# Patient Record
Sex: Male | Born: 1955
Health system: Southern US, Community
[De-identification: ages and names within clinical notes are randomized; demographics above are authoritative.]

## PROBLEM LIST (undated history)

## (undated) DIAGNOSIS — M25569 Pain in unspecified knee: Secondary | ICD-10-CM

## (undated) DIAGNOSIS — K279 Peptic ulcer, site unspecified, unspecified as acute or chronic, without hemorrhage or perforation: Secondary | ICD-10-CM

## (undated) DIAGNOSIS — M1990 Unspecified osteoarthritis, unspecified site: Secondary | ICD-10-CM

## (undated) HISTORY — PX: MANDIBLE FRACTURE SURGERY: SHX706

---

## 2017-08-03 ENCOUNTER — Ambulatory Visit: Payer: Self-pay | Attending: Family Medicine | Admitting: Physician Assistant

## 2017-08-03 ENCOUNTER — Ambulatory Visit (HOSPITAL_COMMUNITY)
Admission: RE | Admit: 2017-08-03 | Discharge: 2017-08-03 | Disposition: A | Discharge status: Home / Self Care | Payer: Self-pay | Source: Ambulatory Visit | Attending: Physician Assistant | Admitting: Physician Assistant

## 2017-08-03 VITALS — BP 121/68 | HR 60 | Temp 97.6°F | Resp 18 | Ht 73.0 in | Wt 180.6 lb

## 2017-08-03 DIAGNOSIS — Z79899 Other long term (current) drug therapy: Secondary | ICD-10-CM | POA: Insufficient documentation

## 2017-08-03 DIAGNOSIS — M25561 Pain in right knee: Secondary | ICD-10-CM | POA: Insufficient documentation

## 2017-08-03 DIAGNOSIS — G8929 Other chronic pain: Secondary | ICD-10-CM | POA: Insufficient documentation

## 2017-08-03 DIAGNOSIS — I7090 Unspecified atherosclerosis: Secondary | ICD-10-CM | POA: Insufficient documentation

## 2017-08-03 DIAGNOSIS — Z7689 Persons encountering health services in other specified circumstances: Secondary | ICD-10-CM | POA: Insufficient documentation

## 2017-08-03 DIAGNOSIS — M1711 Unilateral primary osteoarthritis, right knee: Secondary | ICD-10-CM | POA: Insufficient documentation

## 2017-08-03 DIAGNOSIS — M7989 Other specified soft tissue disorders: Secondary | ICD-10-CM | POA: Insufficient documentation

## 2017-08-03 MED ORDER — NAPROXEN 500 MG PO TABS: 500.0000 mg | ORAL_TABLET | Freq: Two times a day (BID) | ORAL | 1 refills | Status: DC

## 2017-08-03 MED FILL — NAPROXEN 500 MG TABLET: 500 | 30 days supply | Qty: 60 | Fill #0

## 2017-08-03 NOTE — Progress Notes (Signed)
Patient ID: Willie Parker, male   DOB: 11/17/1955, 62 y.o.   MRN: [id number]      Willie Parker, is a 62 y.o. male  HBZ:[id number]  BOF:[id number]  DOB - 06/19/1955  Subjective:  Chief Complaint and HPI: Willie Parker is a 62 y.o. male here today to establish care and for R knee pain and problem for several years.  NKI. All siblings have had to have knee replacement in their 41s. He takes goodies powders or ibuprofen when it acts up.  It is starting to bother him as far as climbing stairs and weight bearing.  No f/c.    ROS:   Constitutional:  No f/c, No night sweats, No unexplained weight loss. EENT:  No vision changes, No blurry vision, No hearing changes. No mouth, throat, or ear problems.  Respiratory: No cough, No SOB Cardiac: No CP, no palpitations GI:  No abd pain, No N/V/D. GU: No Urinary s/sx Musculoskeletal: +R knee pain Neuro: No headache, no dizziness, no motor weakness.  Skin: No rash Endocrine:  No polydipsia. No polyuria.  Psych: Denies SI/HI  No problems updated.  ALLERGIES: No Known Allergies  PAST MEDICAL HISTORY: History reviewed. No pertinent past medical history.  MEDICATIONS AT HOME: Prior to Admission medications   Medication Sig Start Date End Date Taking? Authorizing Provider  ibuprofen (ADVIL,MOTRIN) 200 MG tablet Take 200 mg by mouth every 6 (six) hours as needed.   Yes [provider]  naproxen (NAPROSYN) 500 MG tablet Take 1 tablet (500 mg total) by mouth 2 (two) times daily with a meal. X 7 days then prn pain 08/03/17   Argentina Donovan, PA-C     Objective:  EXAM:   Vitals:   08/03/17 0923  BP: 121/68  Pulse: 60  Resp: 18  Temp: 97.6 F (36.4 C)  TempSrc: Oral  SpO2: 97%  Weight: 180 lb 9.6 oz (81.9 kg)  Height: 6\' 1"  (1.854 m)    General appearance : A&OX3. NAD. Non-toxic-appearing HEENT: Atraumatic and Normocephalic.  PERRLA. EOM intact.   Neck: supple, no JVD. No cervical lymphadenopathy. No  thyromegaly Chest/Lungs:  Breathing-non-labored, Good air entry bilaterally, breath sounds normal without rales, rhonchi, or wheezing  CVS: S1 S2 regular, no murmurs, gallops, rubs  Extremities: Bilateral Lower Ext shows no edema, both legs are warm to touch with = pulse throughout.  R knee examined and compared with L.  There is mild swelling in the suprapatellar area.  There is bony prominence medially.  No specific TTP.  Ligaments are stable.   Neurology:  CN II-XII grossly intact, Non focal.   Psych:  TP linear. J/I WNL. Normal speech. Appropriate eye contact and affect.  Skin:  No Rash  Data Review No results found for: HGBA1C   Assessment & Plan   1. Chronic pain of right knee - DG Knee Complete 4 Views Right; Future - naproxen (NAPROSYN) 500 MG tablet; Take 1 tablet (500 mg total) by mouth 2 (two) times daily with a meal. X 7 days then prn pain  Dispense: 60 tablet; Refill: 1 - Ambulatory referral to Orthopedic Surgery  Patient have been counseled extensively about nutrition and exercise  Return in about 2 months (around 10/03/2017) for assign PCP; f/up Knee pain/?baseline bloodwork.  The patient was given clear instructions to go to ER or return to medical center if symptoms don't improve, worsen or new problems develop. The patient verbalized understanding. The patient was told to call to get lab results if they haven't heard  anything in the next week.     Freeman Caldron, PA-C [facility] and [place], [place]   08/03/2017, 9:38 AM

## 2017-08-05 ENCOUNTER — Telehealth: Payer: Self-pay | Admitting: *Deleted

## 2017-08-05 NOTE — Telephone Encounter (Signed)
-----   Message from Argentina Donovan, [place] sent at 08/03/2017  4:37 PM EDT ----- Please call patient.  His xray shows moderate arthritis.  Seeing an orthopedist is likely his best option and the referral has been placed. Thanks, Freeman Caldron, PA-C

## 2017-08-05 NOTE — Telephone Encounter (Signed)
Patient verified DOB Patient is aware of moderate arthritis being noted and a referral to the orthopedic being placed. Patient will return Monday with financial packet. No further questions.

## 2017-08-08 ENCOUNTER — Ambulatory Visit: Payer: Self-pay

## 2017-08-17 ENCOUNTER — Encounter: Payer: Self-pay | Admitting: Nurse Practitioner

## 2017-08-17 ENCOUNTER — Ambulatory Visit: Payer: Self-pay | Attending: Nurse Practitioner | Admitting: Nurse Practitioner

## 2017-08-17 VITALS — BP 117/79 | HR 66 | Temp 97.9°F | Ht 72.0 in | Wt 182.6 lb

## 2017-08-17 DIAGNOSIS — Z79899 Other long term (current) drug therapy: Secondary | ICD-10-CM | POA: Insufficient documentation

## 2017-08-17 DIAGNOSIS — Z8249 Family history of ischemic heart disease and other diseases of the circulatory system: Secondary | ICD-10-CM | POA: Insufficient documentation

## 2017-08-17 DIAGNOSIS — M25561 Pain in right knee: Secondary | ICD-10-CM | POA: Insufficient documentation

## 2017-08-17 DIAGNOSIS — G8929 Other chronic pain: Secondary | ICD-10-CM | POA: Insufficient documentation

## 2017-08-17 DIAGNOSIS — M7989 Other specified soft tissue disorders: Secondary | ICD-10-CM | POA: Insufficient documentation

## 2017-08-17 MED ORDER — TRAMADOL HCL 50 MG PO TABS: 50.0000 mg | ORAL_TABLET | Freq: Three times a day (TID) | ORAL | 0 refills | Status: DC | PRN

## 2017-08-17 MED ORDER — DICLOFENAC SODIUM 1 % TD GEL: 4.0000 g | Freq: Four times a day (QID) | TRANSDERMAL | 1 refills | Status: DC

## 2017-08-17 MED FILL — DICLOFENAC SODIUM 1% GEL: 1 | 6 days supply | Qty: 100 | Fill #0

## 2017-08-17 MED FILL — traMADol HCL 50 MG TABS: 50 | 20 days supply | Qty: 60 | Fill #0

## 2017-08-17 NOTE — Progress Notes (Signed)
Assessment & Plan:  Willie Parker was seen today for new patient (initial visit).  Diagnoses and all orders for this visit:  Acute pain of right knee -     diclofenac sodium (VOLTAREN) 1 % GEL; Apply 4 g topically 4 (four) times daily. -     traMADol (ULTRAM) 50 MG tablet; Take 1 tablet (50 mg total) by mouth every 8 (eight) hours as needed.  May alternate with heat and ice application for pain relief. May also alternate with acetaminophen  as prescribed for pain. Other alternatives water aerobics.  You must stay active and avoid a sedentary lifestyle.  Patient has been counseled on age-appropriate routine health concerns for screening and prevention. These are reviewed and up-to-date. Referrals have been placed accordingly. Immunizations are up-to-date or declined.    Subjective:   Chief Complaint  Patient presents with  . New Patient (Initial Visit)    Pt. is here of right knee pain. Pt. stated the medication is not helping him.    HPI Willie Parker 62 y.o. male presents to office today to establish care. He has a history of chronic right knee pain.  Knee Pain Patient presents with knee pain and swelling involving the  right knee. Onset of the symptoms was a few years ago. Inciting event: none known. Current symptoms include aching, dull, stiffness and unable to bear weight on the right. Pain is aggravated by bearing weight and lying down.  Patient has had prior knee problems. Evaluation to date:plain films: 08-03-2017 abnormal degenerative changes involving the medial and patellofemoral compartments of the knee. Treatment to date: ice, OTC analgesics which are not very effective and prescription NSAIDS which are not very effective. Will need additional imaging. He has been instructed to follow up with the financial assistance counselor.   Review of Systems  Constitutional: Negative for fever, malaise/fatigue and weight loss.  HENT: Negative.  Negative for nosebleeds.   Eyes: Negative.   Negative for blurred vision, double vision and photophobia.  Respiratory: Negative.  Negative for cough and shortness of breath.   Cardiovascular: Negative.  Negative for chest pain, palpitations and leg swelling.  Gastrointestinal: Negative.  Negative for heartburn, nausea and vomiting.  Musculoskeletal: Positive for joint pain (right knee pain). Negative for myalgias.  Neurological: Negative.  Negative for dizziness, focal weakness, seizures and headaches.  Psychiatric/Behavioral: Negative.  Negative for suicidal ideas.    History reviewed. No pertinent past medical history.  History reviewed. No pertinent surgical history.  Family History  Problem Relation Age of Onset  . Diabetes Mother   . Hypertension Mother     Social History Reviewed with no changes to be made today.   Outpatient Medications Prior to Visit  Medication Sig Dispense Refill  . ibuprofen (ADVIL,MOTRIN) 200 MG tablet Take 200 mg by mouth every 6 (six) hours as needed.    . naproxen (NAPROSYN) 500 MG tablet Take 1 tablet (500 mg total) by mouth 2 (two) times daily with a meal. X 7 days then prn pain 60 tablet 1   No facility-administered medications prior to visit.     No Known Allergies     Objective:    BP 117/79 (BP Location: Right Arm, Patient Position: Sitting, Cuff Size: Normal)   Pulse 66   Temp 97.9 F (36.6 C) (Oral)   Ht 6' (1.829 m)   Wt 182 lb 9.6 oz (82.8 kg)   SpO2 98%   BMI 24.77 kg/m  Wt Readings from Last 3 Encounters:  08/17/17 182  lb 9.6 oz (82.8 kg)  08/03/17 180 lb 9.6 oz (81.9 kg)    Physical Exam  Constitutional: He is oriented to person, place, and time. He appears well-developed and well-nourished. He is cooperative.  HENT:  Head: Normocephalic and atraumatic.  Eyes: EOM are normal.  Neck: Normal range of motion.  Cardiovascular: Normal rate, regular rhythm, normal heart sounds and intact distal pulses. Exam reveals no gallop and no friction rub.  No murmur  heard. Pulmonary/Chest: Effort normal and breath sounds normal. No tachypnea. No respiratory distress. He has no decreased breath sounds. He has no wheezes. He has no rhonchi. He has no rales. He exhibits no tenderness.  Abdominal: Soft. Bowel sounds are normal.  Musculoskeletal: Normal range of motion. He exhibits edema and tenderness.       Right knee: Tenderness found. Medial joint line, lateral joint line and patellar tendon tenderness noted.  Neurological: He is alert and oriented to person, place, and time. Coordination normal.  Skin: Skin is warm and dry.  Psychiatric: He has a normal mood and affect. His behavior is normal. Judgment and thought content normal.  Nursing note and vitals reviewed.      Patient has been counseled extensively about nutrition and exercise as well as the importance of adherence with medications and regular follow-up. The patient was given clear instructions to go to ER or return to medical center if symptoms don't improve, worsen or new problems develop. The patient verbalized understanding.   Follow-up: Return in about 6 weeks (around 09/28/2017) for right knee pain.   Willie Pounds, FNP-BC [facility] and [facility] [place], [place]   08/20/2017, 11:11 PM

## 2017-08-17 NOTE — Patient Instructions (Signed)
Osteoarthritis Osteoarthritis is a type of arthritis that affects tissue that covers the ends of bones in joints (cartilage). Cartilage acts as a cushion between the bones and helps them move smoothly. Osteoarthritis results when cartilage in the joints gets worn down. Osteoarthritis is sometimes called "wear and tear" arthritis. Osteoarthritis is the most common form of arthritis. It often occurs in older people. It is a condition that gets worse over time (a progressive condition). Joints that are most often affected by this condition are in:  Fingers.  Toes.  Hips.  Knees.  Spine, including neck and lower back.  What are the causes? This condition is caused by age-related wearing down of cartilage that covers the ends of bones. What increases the risk? The following factors may make you more likely to develop this condition:  Older age.  Being overweight or obese.  Overuse of joints, such as in athletes.  Past injury of a joint.  Past surgery on a joint.  Family history of osteoarthritis.  What are the signs or symptoms? The main symptoms of this condition are pain, swelling, and stiffness in the joint. The joint may lose its shape over time. Small pieces of bone or cartilage may break off and float inside of the joint, which may cause more pain and damage to the joint. Small deposits of bone (osteophytes) may grow on the edges of the joint. Other symptoms may include:  A grating or scraping feeling inside the joint when you move it.  Popping or creaking sounds when you move.  Symptoms may affect one or more joints. Osteoarthritis in a major joint, such as your knee or hip, can make it painful to walk or exercise. If you have osteoarthritis in your hands, you might not be able to grip items, twist your hand, or control small movements of your hands and fingers (fine motor skills). How is this diagnosed? This condition may be diagnosed based on:  Your medical history.  A  physical exam.  Your symptoms.  X-rays of the affected joint(s).  Blood tests to rule out other types of arthritis.  How is this treated? There is no cure for this condition, but treatment can help to control pain and improve joint function. Treatment plans may include:  A prescribed exercise program that allows for rest and joint relief. You may work with a physical therapist.  A weight control plan.  Pain relief techniques, such as: ? Applying heat and cold to the joint. ? Electric pulses delivered to nerve endings under the skin (transcutaneous electrical nerve stimulation, or TENS). ? Massage. ? Certain nutritional supplements.  NSAIDs or prescription medicines to help relieve pain.  Medicine to help relieve pain and inflammation (corticosteroids). This can be given by mouth (orally) or as an injection.  Assistive devices, such as a brace, wrap, splint, specialized glove, or cane.  Surgery, such as: ? An osteotomy. This is done to reposition the bones and relieve pain or to remove loose pieces of bone and cartilage. ? Joint replacement surgery. You may need this surgery if you have very bad (advanced) osteoarthritis.  Follow these instructions at home: Activity  Rest your affected joints as directed by your health care provider.  Do not drive or use heavy machinery while taking prescription pain medicine.  Exercise as directed. Your health care provider or physical therapist may recommend specific types of exercise, such as: ? Strengthening exercises. These are done to strengthen the muscles that support joints that are affected by arthritis.   They can be performed with weights or with exercise bands to add resistance. ? Aerobic activities. These are exercises, such as brisk walking or water aerobics, that get your heart pumping. ? Range-of-motion activities. These keep your joints easy to move. ? Balance and agility exercises. Managing pain, stiffness, and  swelling  If directed, apply heat to the affected area as often as told by your health care provider. Use the heat source that your health care provider recommends, such as a moist heat pack or a heating pad. ? If you have a removable assistive device, remove it as told by your health care provider. ? Place a towel between your skin and the heat source. If your health care provider tells you to keep the assistive device on while you apply heat, place a towel between the assistive device and the heat source. ? Leave the heat on for 20-30 minutes. ? Remove the heat if your skin turns bright red. This is especially important if you are unable to feel pain, heat, or cold. You may have a greater risk of getting burned.  If directed, put ice on the affected joint: ? If you have a removable assistive device, remove it as told by your health care provider. ? Put ice in a plastic bag. ? Place a towel between your skin and the bag. If your health care provider tells you to keep the assistive device on during icing, place a towel between the assistive device and the bag. ? Leave the ice on for 20 minutes, 2-3 times a day. General instructions  Take over-the-counter and prescription medicines only as told by your health care provider.  Maintain a healthy weight. Follow instructions from your health care provider for weight control. These may include dietary restrictions.  Do not use any products that contain nicotine or tobacco, such as cigarettes and e-cigarettes. These can delay bone healing. If you need help quitting, ask your health care provider.  Use assistive devices as directed by your health care provider.  Keep all follow-up visits as told by your health care provider. This is important. Where to find more information:  [employer] of Arthritis and Musculoskeletal and Skin Diseases: www.niams.SouthExposed.es  [employer] on Aging: http://kim-miller.com/  American College of Rheumatology:  www.rheumatology.org Contact a health care provider if:  Your skin turns red.  You develop a rash.  You have pain that gets worse.  You have a fever along with joint or muscle aches. Get help right away if:  You lose a lot of weight.  You suddenly lose your appetite.  You have night sweats. Summary  Osteoarthritis is a type of arthritis that affects tissue covering the ends of bones in joints (cartilage).  This condition is caused by age-related wearing down of cartilage that covers the ends of bones.  The main symptom of this condition is pain, swelling, and stiffness in the joint.  There is no cure for this condition, but treatment can help to control pain and improve joint function. This information is not intended to replace advice given to you by your health care provider. Make sure you discuss any questions you have with your health care provider. Document Released: 02/15/2005 Document Revised: 10/20/2015 Document Reviewed: 10/20/2015 Elsevier Interactive Patient Education  2018 [place].  Knee Pain, Adult Many things can cause knee pain. The pain often goes away on its own with time and rest. If the pain does not go away, tests may be done to find out what is  causing the pain. Follow these instructions at home: Activity  Rest your knee.  Do not do things that cause pain.  Avoid activities where both feet leave the ground at the same time (high-impact activities). Examples are running, jumping rope, and doing jumping jacks. General instructions  Take medicines only as told by your doctor.  Raise (elevate) your knee when you are resting. Make sure your knee is higher than your heart.  Sleep with a pillow under your knee.  If told, put ice on the knee: ? Put ice in a plastic bag. ? Place a towel between your skin and the bag. ? Leave the ice on for 20 minutes, 2-3 times a day.  Ask your doctor if you should wear an elastic knee support.  Lose weight if  you are overweight. Being overweight can make your knee hurt more.  Do not use any tobacco products. These include cigarettes, chewing tobacco, or electronic cigarettes. If you need help quitting, ask your doctor. Smoking may slow down healing. Contact a doctor if:  The pain does not stop.  The pain changes or gets worse.  You have a fever along with knee pain.  Your knee gives out or locks up.  Your knee swells, and becomes worse. Get help right away if:  Your knee feels warm.  You cannot move your knee.  You have very bad knee pain.  You have chest pain.  You have trouble breathing. Summary  Many things can cause knee pain. The pain often goes away on its own with time and rest.  Avoid activities that put stress on your knee. These include running and jumping rope.  Get help right away if you cannot move your knee, or if your knee feels warm, or if you have trouble breathing. This information is not intended to replace advice given to you by your health care provider. Make sure you discuss any questions you have with your health care provider. Document Released: 05/14/2008 Document Revised: 02/10/2016 Document Reviewed: 02/10/2016 Elsevier Interactive Patient Education  2017 [employer].

## 2017-08-20 ENCOUNTER — Encounter: Payer: Self-pay | Admitting: Nurse Practitioner

## 2017-11-15 ENCOUNTER — Ambulatory Visit: Payer: Medicaid Other | Attending: Nurse Practitioner | Admitting: Nurse Practitioner

## 2017-11-15 ENCOUNTER — Encounter: Payer: Self-pay | Admitting: Nurse Practitioner

## 2017-11-15 VITALS — BP 137/82 | HR 57 | Temp 98.0°F | Ht 72.0 in | Wt 188.8 lb

## 2017-11-15 DIAGNOSIS — M25561 Pain in right knee: Secondary | ICD-10-CM

## 2017-11-15 DIAGNOSIS — Z8249 Family history of ischemic heart disease and other diseases of the circulatory system: Secondary | ICD-10-CM | POA: Insufficient documentation

## 2017-11-15 DIAGNOSIS — Z833 Family history of diabetes mellitus: Secondary | ICD-10-CM | POA: Diagnosis not present

## 2017-11-15 DIAGNOSIS — Z1211 Encounter for screening for malignant neoplasm of colon: Secondary | ICD-10-CM

## 2017-11-15 DIAGNOSIS — M7989 Other specified soft tissue disorders: Secondary | ICD-10-CM | POA: Insufficient documentation

## 2017-11-15 DIAGNOSIS — G8929 Other chronic pain: Secondary | ICD-10-CM

## 2017-11-15 DIAGNOSIS — Z0000 Encounter for general adult medical examination without abnormal findings: Secondary | ICD-10-CM

## 2017-11-15 MED ORDER — DICLOFENAC SODIUM 1 % TD GEL: 4.0000 g | Freq: Four times a day (QID) | TRANSDERMAL | 1 refills | Status: DC

## 2017-11-15 MED ORDER — PREDNISONE 20 MG PO TABS: 20.0000 mg | ORAL_TABLET | Freq: Every day | ORAL | 0 refills | Status: AC

## 2017-11-15 MED FILL — DICLOFENAC SODIUM 1% GEL: 1 | 6 days supply | Qty: 100 | Fill #0

## 2017-11-15 MED FILL — predniSONE 20 MG TABS: 20 | 7 days supply | Qty: 7 | Fill #0

## 2017-11-15 NOTE — Progress Notes (Signed)
Assessment & Plan:  Willie Parker was seen today for follow-up.  Diagnoses and all orders for this visit:  Chronic pain of right knee -     Ambulatory referral to Orthopedic Surgery -     Uric Acid -     diclofenac sodium (VOLTAREN) 1 % GEL; Apply 4 g topically 4 (four) times daily. -     predniSONE (DELTASONE) 20 MG tablet; Take 1 tablet (20 mg total) by mouth daily with breakfast for 7 days.  Routine adult health maintenance -     CMP14+EGFR -     CBC  Colon cancer screening -     Ambulatory referral to Gastroenterology    Patient has been counseled on age-appropriate routine health concerns for screening and prevention. These are reviewed and up-to-date. Referrals have been placed accordingly. Immunizations are up-to-date or declined.    Subjective:   Chief Complaint  Patient presents with  . Follow-up    Pt. is for right knee pain.    HPI Willie Parker 62 y.o. male presents to office today for follow up to chronic right knee pain.  Knee Pain Ongoing for several years. Inciting event: None. Pain is aggravated by weight bearing, bending, twisting and attempting to sleep at night. Treatment to date: Ice, prescription NSAIDs, OTC analgesics with little relief of pain. At his last office visit with me on 08-17-2017 he was prescribed voltaren gel and tramadol. He states the gel has been most effective for pain relief.  Today he endorses increased swelling and pain of the right knee as he has been out of the voltaren gel for over 2 weeks now.   Plain film xray 08-03-2017 Moderate degenerative changes involve the medial and patellofemoral compartments of the knee  Review of Systems  Constitutional: Negative for fever, malaise/fatigue and weight loss.  HENT: Negative.  Negative for nosebleeds.   Eyes: Negative.  Negative for blurred vision, double vision and photophobia.  Respiratory: Negative.  Negative for cough and shortness of breath.   Cardiovascular: Positive for leg swelling  (right knee). Negative for chest pain and palpitations.  Gastrointestinal: Negative.  Negative for heartburn, nausea and vomiting.  Musculoskeletal: Positive for joint pain. Negative for myalgias.  Neurological: Negative.  Negative for dizziness, focal weakness, seizures and headaches.  Psychiatric/Behavioral: Negative.  Negative for suicidal ideas.    History reviewed. No pertinent past medical history.  History reviewed. No pertinent surgical history.  Family History  Problem Relation Age of Onset  . Diabetes Mother   . Hypertension Mother     Social History Reviewed with no changes to be made today.   Outpatient Medications Prior to Visit  Medication Sig Dispense Refill  . diclofenac sodium (VOLTAREN) 1 % GEL Apply 4 g topically 4 (four) times daily. 100 g 1  . traMADol (ULTRAM) 50 MG tablet Take 1 tablet (50 mg total) by mouth every 8 (eight) hours as needed. (Patient not taking: Reported on 11/15/2017) 60 tablet 0   No facility-administered medications prior to visit.     No Known Allergies     Objective:    BP 137/82 (BP Location: Left Arm, Patient Position: Sitting, Cuff Size: Normal)   Pulse (!) 57   Temp 98 F (36.7 C) (Oral)   Ht 6' (1.829 m)   Wt 188 lb 12.8 oz (85.6 kg)   SpO2 99%   BMI 25.61 kg/m  Wt Readings from Last 3 Encounters:  11/15/17 188 lb 12.8 oz (85.6 kg)  08/17/17 182 lb 9.6  oz (82.8 kg)  08/03/17 180 lb 9.6 oz (81.9 kg)    Physical Exam  Constitutional: He is oriented to person, place, and time. He appears well-developed and well-nourished. He is cooperative.  HENT:  Head: Normocephalic and atraumatic.  Eyes: EOM are normal.  Neck: Normal range of motion.  Cardiovascular: Regular rhythm and normal heart sounds. Bradycardia present. Exam reveals no gallop and no friction rub.  No murmur heard. Pulmonary/Chest: Effort normal and breath sounds normal. No tachypnea. No respiratory distress. He has no decreased breath sounds. He has no  wheezes. He has no rhonchi. He has no rales. He exhibits no tenderness.  Abdominal: Soft. Bowel sounds are normal.  Musculoskeletal: He exhibits no edema.       Right knee: He exhibits decreased range of motion and swelling. Tenderness found. Medial joint line and lateral joint line tenderness noted.  Neurological: He is alert and oriented to person, place, and time. Coordination normal.  Skin: Skin is warm and dry.  Psychiatric: He has a normal mood and affect. His behavior is normal. Judgment and thought content normal.  Nursing note and vitals reviewed.        Patient has been counseled extensively about nutrition and exercise as well as the importance of adherence with medications and regular follow-up. The patient was given clear instructions to go to ER or return to medical center if symptoms don't improve, worsen or new problems develop. The patient verbalized understanding.   Follow-up: Return in about 3 months (around 02/14/2018) for FASTING labs and Physical.   Gildardo Pounds, FNP-BC [facility] and [facility] [place], [place]   11/15/2017, 3:04 PM

## 2017-11-16 LAB — CMP14+EGFR
ALT: 19 IU/L (ref 0–44)
AST: 17 IU/L (ref 0–40)
Albumin/Globulin Ratio: 1.6 (ref 1.2–2.2)
Albumin: 4.7 g/dL (ref 3.6–4.8)
Alkaline Phosphatase: 90 IU/L (ref 39–117)
BUN/Creatinine Ratio: 18 (ref 10–24)
BUN: 14 mg/dL (ref 8–27)
Bilirubin Total: <0.2 mg/dL (ref 0.0–1.2)
CALCIUM: 10.1 mg/dL (ref 8.6–10.2)
CO2: 21 mmol/L (ref 20–29)
CREATININE: 0.78 mg/dL (ref 0.76–1.27)
Chloride: 103 mmol/L (ref 96–106)
GFR, EST AFRICAN AMERICAN: 113 mL/min/{1.73_m2} (ref >59)
GFR, EST NON AFRICAN AMERICAN: 98 mL/min/{1.73_m2} (ref >59)
GLOBULIN, TOTAL: 2.9 g/dL (ref 1.5–4.5)
Glucose: 80 mg/dL (ref 65–99)
Potassium: 4 mmol/L (ref 3.5–5.2)
Sodium: 142 mmol/L (ref 134–144)
Total Protein: 7.6 g/dL (ref 6.0–8.5)

## 2017-11-16 LAB — URIC ACID: URIC ACID: 6.1 mg/dL (ref 3.7–8.6)

## 2017-11-16 LAB — CBC
Hematocrit: 45 % (ref 37.5–51.0)
Hemoglobin: 15.6 g/dL (ref 13.0–17.7)
MCH: 29.4 pg (ref 26.6–33.0)
MCHC: 34.7 g/dL (ref 31.5–35.7)
MCV: 85 fL (ref 79–97)
PLATELETS: 229 10*3/uL (ref 150–450)
RBC: 5.3 x10E6/uL (ref 4.14–5.80)
RDW: 14.5 % (ref 12.3–15.4)
WBC: 10.2 10*3/uL (ref 3.4–10.8)

## 2017-11-18 ENCOUNTER — Telehealth: Payer: Self-pay

## 2017-11-18 NOTE — Telephone Encounter (Signed)
CMA attempt to reach patient to inform on results.  Patient's brother answer and was told to inform patient to give a call back for his lab results.

## 2017-11-18 NOTE — Telephone Encounter (Signed)
-----   Message from Gildardo Pounds, NP sent at 11/18/2017  8:52 AM EDT ----- Uric acid level is normal and does not indicate any gout.  All of your other labs including your electrolytes sodium potassium, kidney and liver function are normal.  Your CBC does not show any anemia.

## 2017-11-23 ENCOUNTER — Ambulatory Visit (INDEPENDENT_AMBULATORY_CARE_PROVIDER_SITE_OTHER): Payer: Self-pay | Admitting: Orthopaedic Surgery

## 2017-11-23 ENCOUNTER — Ambulatory Visit (INDEPENDENT_AMBULATORY_CARE_PROVIDER_SITE_OTHER): Payer: Self-pay

## 2017-11-23 ENCOUNTER — Encounter (INDEPENDENT_AMBULATORY_CARE_PROVIDER_SITE_OTHER): Payer: Self-pay | Admitting: Orthopaedic Surgery

## 2017-11-23 DIAGNOSIS — M1711 Unilateral primary osteoarthritis, right knee: Secondary | ICD-10-CM

## 2017-11-23 NOTE — Progress Notes (Signed)
Office Visit Note   Patient: Willie Parker           Date of Birth: [birth year]           MRN: [id number] Visit Date: 11/23/2017              Requested by: Willie Pounds, NP Willie Parker, Willie Parker Willie Parker PCP: Willie Pounds, NP   Assessment & Plan: Visit Diagnoses:  1. 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  Patient has been dealing with this for years now and he has had minimal relief from conservative treatment.  At this point after discussion of treatment options and their associated risks and benefits he elects to proceed with a right total knee replacement.  We discussed postoperative rehab and recovery.  He denies a history of DVT or nickel allergy.  He is okay taking aspirin.  Follow-up for 2-week postop visit.  We will schedule him in the near future.  Follow-Up Instructions: Return for 2 week postop visit.   Orders:  Orders Placed This Encounter  Procedures  . XR KNEE 3 VIEW RIGHT   No orders of the defined types were placed in this encounter.     Procedures: No procedures performed   Clinical Data: No additional findings.   Subjective: Chief Complaint  Patient presents with  . Right Knee - Pain    Willie Parker is a 62 year old gentleman who comes in with chronic right knee pain for many years.  He has severe limitations in ADLs he has difficulty sleeping at night.  He has trouble with stairs.  He states that he has stiffness of his knee he has difficulty bending.  He has tried oral and topical NSAIDs as well as steroid tapers.  He has had severe pain for years now.  He is very uncomfortable and has no quality of life.  Denies any mechanical symptoms.   Review of Systems  Constitutional: Negative.   All other systems reviewed and are negative.    Objective: Vital Signs: There were no vitals taken for this  visit.  Physical Exam  Constitutional: He is oriented to person, place, and time. He appears well-developed and well-nourished.  HENT:  Head: Normocephalic and atraumatic.  Eyes: Pupils are equal, round, and reactive to light.  Neck: Neck supple.  Pulmonary/Chest: Effort normal.  Abdominal: Soft.  Musculoskeletal: Normal range of motion.  Neurological: He is alert and oriented to person, place, and time.  Skin: Skin is warm.  Psychiatric: He has a normal mood and affect. His behavior is normal. Judgment and thought content normal.  Nursing note and vitals reviewed.   Ortho Exam Right knee exam shows no joint effusion.  Positive patellofemoral crepitus.  Collaterals and cruciates are stable.  Mild joint line tenderness. Specialty Comments:  No specialty comments available.  Imaging: Xr Knee 3 View Right  Result Date: 11/23/2017 Advanced tricompartmental degenerative joint disease    PMFS History: There are no active problems to display for this patient.  History reviewed. No pertinent past medical history.  Family History  Problem Relation Age of Onset  . Diabetes Mother   . Hypertension Mother     History reviewed. No pertinent surgical history. Social History   Occupational History  . Not on file  Tobacco Use  . Smoking status: Current Every Day Smoker    Packs/day: 0.25    Types: Cigarettes  .  Smokeless tobacco: Never Used  Substance and Sexual Activity  . Alcohol use: Yes    Comment: weekends  . Drug use: Never  . Sexual activity: Not Currently

## 2017-11-24 ENCOUNTER — Other Ambulatory Visit: Payer: Self-pay

## 2017-11-24 ENCOUNTER — Encounter (HOSPITAL_BASED_OUTPATIENT_CLINIC_OR_DEPARTMENT_OTHER): Payer: Self-pay | Admitting: Emergency Medicine

## 2017-11-24 ENCOUNTER — Emergency Department (HOSPITAL_BASED_OUTPATIENT_CLINIC_OR_DEPARTMENT_OTHER)
Admission: EM | Admit: 2017-11-24 | Discharge: 2017-11-24 | Disposition: A | Discharge status: Home / Self Care | Payer: Medicaid Other | Attending: Emergency Medicine | Admitting: Emergency Medicine

## 2017-11-24 DIAGNOSIS — F17210 Nicotine dependence, cigarettes, uncomplicated: Secondary | ICD-10-CM | POA: Diagnosis not present

## 2017-11-24 DIAGNOSIS — H5711 Ocular pain, right eye: Secondary | ICD-10-CM | POA: Diagnosis present

## 2017-11-24 DIAGNOSIS — H1089 Other conjunctivitis: Secondary | ICD-10-CM

## 2017-11-24 DIAGNOSIS — H10021 Other mucopurulent conjunctivitis, right eye: Secondary | ICD-10-CM | POA: Insufficient documentation

## 2017-11-24 DIAGNOSIS — H10022 Other mucopurulent conjunctivitis, left eye: Secondary | ICD-10-CM | POA: Insufficient documentation

## 2017-11-24 HISTORY — DX: Unspecified osteoarthritis, unspecified site: M19.90

## 2017-11-24 HISTORY — DX: Pain in unspecified knee: M25.569

## 2017-11-24 MED ORDER — POLYMYXIN B-TRIMETHOPRIM 10000-0.1 UNIT/ML-% OP SOLN: 1.0000 [drp] | OPHTHALMIC | 0 refills | Status: DC

## 2017-11-24 MED FILL — POLYMYXIN B/TMP EYE DROPS: 10000-0.1 | 7 days supply | Qty: 10 | Fill #0

## 2017-11-24 NOTE — ED Triage Notes (Signed)
Right eye irritation, itching and drainage for two days.

## 2017-12-05 NOTE — ED Provider Notes (Signed)
[facility]GENCY DEPARTMENT Provider Note   CSN: [id number] Arrival date & time: 11/24/17  1014     History   Chief Complaint Chief Complaint  Patient presents with  . Eye Problem    HPI Willie Parker is a 62 y.o. male.  HPI   62 year old male with right knee irritation.  Gradual onset about 2 days ago.  Describes itching and foreign body sensation "like Just to my."  Denies any trauma.  Some redness.  Some mild drainage and crusting in the morning.  No change in visual acuity.  He does not wear contacts.  Past Medical History:  Diagnosis Date  . Arthritis   . Knee pain     There are no active problems to display for this patient.   History reviewed. No pertinent surgical history.      Home Medications    Prior to Admission medications   Medication Sig Start Date End Date Taking? Authorizing Provider  diclofenac sodium (VOLTAREN) 1 % GEL Apply 4 g topically 4 (four) times daily. Patient not taking: Reported on 11/23/2017 11/15/17   Gildardo Pounds, NP  trimethoprim-polymyxin b (POLYTRIM) ophthalmic solution Place 1 drop into both eyes every 4 (four) hours. 11/24/17   Virgel Manifold, MD    Family History Family History  Problem Relation Age of Onset  . Diabetes Mother   . Hypertension Mother     Social History Social History   Tobacco Use  . Smoking status: Current Every Day Smoker    Packs/day: 0.25    Types: Cigarettes  . Smokeless tobacco: Never Used  Substance Use Topics  . Alcohol use: Yes    Comment: weekends  . Drug use: Never     Allergies   Patient has no known allergies.   Review of Systems Review of Systems   Physical Exam Updated Vital Signs BP 113/80   Pulse 65   Temp 98.6 F (37 C)   Resp 16   Ht 6' (1.829 m)   Wt 85.3 kg   SpO2 99%   BMI 25.50 kg/m   Physical Exam  Constitutional: He appears well-developed and well-nourished. No distress.  HENT:  Head: Normocephalic and atraumatic.  Eyes: Right  eye exhibits no discharge. Left eye exhibits no discharge.  Mild conjunctival injection of right eye.  Peri-ocular tissues are normal in appearance.  There is no proptosis.  Pupils are equally round reactive to light.  Anterior chamber clear.  Extraocular muscle function is intact.  Neck: Neck supple.  Cardiovascular: Normal rate, regular rhythm and normal heart sounds. Exam reveals no gallop and no friction rub.  No murmur heard. Pulmonary/Chest: Effort normal and breath sounds normal. No respiratory distress.  Abdominal: Soft. He exhibits no distension. There is no tenderness.  Musculoskeletal: He exhibits no edema or tenderness.  Neurological: He is alert.  Skin: Skin is warm and dry.  Psychiatric: He has a normal mood and affect. His behavior is normal. Thought content normal.  Nursing note and vitals reviewed.    ED Treatments / Results  Labs (all labs ordered are listed, but only abnormal results are displayed) Labs Reviewed - No data to display  EKG None  Radiology No results found.  Procedures Procedures (including critical care time)  Medications Ordered in ED Medications - No data to display   Initial Impression / Assessment and Plan / ED Course  I have reviewed the triage vital signs and the nursing notes.  Pertinent labs & imaging results that were  available during my care of the patient were reviewed by me and considered in my medical decision making (see chart for details).     62 year old male with likely allergic or viral conjunctivitis to the right knee.  He is concerned for possible infection.  Clinically not.  He was discharged with a prescription for Polytrim though.  Cool compresses.  He does not wear corrective lenses.  Return precautions were discussed.  Final Clinical Impressions(s) / ED Diagnoses   Final diagnoses:  Other conjunctivitis of both eyes    ED Discharge Orders         Ordered    trimethoprim-polymyxin b (POLYTRIM) ophthalmic  solution  Every 4 hours     11/24/17 1058           Virgel Manifold, MD 12/05/17 [phone]

## 2017-12-09 NOTE — Pre-Procedure Instructions (Addendum)
Sherman Donaldson  12/09/2017      [facility] [place] [place] [address] Phone: [phone] Fax: [phone]    Your procedure is scheduled on December 19, 2017.  Report to [facility] Admitting at 815 AM.  Call this number if you have problems the morning of surgery:  [phone]   Remember:  Do not eat or drink after midnight.    Take these medicines the morning of surgery with A SIP OF WATER  Eye ointment-if needed  7 days prior to surgery STOP taking any diclofenac (voltaren), Aspirin (unless otherwise instructed by your surgeon), Aleve, Naproxen, Ibuprofen, Motrin, Advil, Goody's, BC's, all herbal medications, fish oil, and all vitamins    Do not wear jewelry  Do not wear lotions, powders, or colognes, or deodorant.  Do  Men may shave face and neck.  Do not bring valuables to the hospital.  [facility] is not responsible for any belongings or valuables.  Contacts, dentures or bridgework may not be worn into surgery.  Leave your suitcase in the car.  After surgery it may be brought to your room.  For patients admitted to the hospital, discharge time will be determined by your treatment team.  Patients discharged the day of surgery will not be allowed to drive home.    - Preparing For Surgery  Before surgery, you can play an important role. Because skin is not sterile, your skin needs to be as free of germs as possible. You can reduce the number of germs on your skin by washing with CHG (chlorahexidine gluconate) Soap before surgery.  CHG is an antiseptic cleaner which kills germs and bonds with the skin to continue killing germs even after washing.    Oral Hygiene is also important to reduce your risk of infection.  Remember - BRUSH YOUR TEETH THE MORNING OF SURGERY WITH YOUR REGULAR TOOTHPASTE  Please do not use if you have an allergy to CHG or antibacterial soaps. If your  skin becomes reddened/irritated stop using the CHG.  Do not shave (including legs and underarms) for at least 48 hours prior to first CHG shower. It is OK to shave your face.  Please follow these instructions carefully.   1. Shower the NIGHT BEFORE SURGERY and the MORNING OF SURGERY with CHG.   2. If you chose to wash your hair, wash your hair first as usual with your normal shampoo.  3. After you shampoo, rinse your hair and body thoroughly to remove the shampoo.  4. Use CHG as you would any other liquid soap. You can apply CHG directly to the skin and wash gently with a scrungie or a clean washcloth.   5. Apply the CHG Soap to your body ONLY FROM THE NECK DOWN.  Do not use on open wounds or open sores. Avoid contact with your eyes, ears, mouth and genitals (private parts). Wash Face and genitals (private parts)  with your normal soap.  6. Wash thoroughly, paying special attention to the area where your surgery will be performed.  7. Thoroughly rinse your body with warm water from the neck down.  8. DO NOT shower/wash with your normal soap after using and rinsing off the CHG Soap.  9. Pat yourself dry with a CLEAN TOWEL.  10. Wear CLEAN PAJAMAS to bed the night before surgery, wear comfortable clothes the morning of surgery  11. Place CLEAN SHEETS on your bed  the night of your first shower and DO NOT SLEEP WITH PETS.  Day of Surgery:  Do not apply any deodorants/lotions.  Please wear clean clothes to the hospital/surgery center.   Remember to brush your teeth WITH YOUR REGULAR TOOTHPASTE.   Please read over the fact sheets that you were given.

## 2017-12-09 NOTE — Progress Notes (Addendum)
PCP: Geryl Rankins, MD  Cardiologist: pt denies  EKG: 12/12/17-obtain at PAT appt  Stress test: pt denies  ECHO: pt denies  Cardiac Cath: pt denies  Chest x-ray: 12/12/17-obtained at PAT appt

## 2017-12-12 ENCOUNTER — Other Ambulatory Visit: Payer: Self-pay

## 2017-12-12 ENCOUNTER — Encounter (HOSPITAL_COMMUNITY): Payer: Self-pay

## 2017-12-12 ENCOUNTER — Encounter (HOSPITAL_COMMUNITY)
Admission: RE | Admit: 2017-12-12 | Discharge: 2017-12-12 | Disposition: A | Discharge status: Home / Self Care | Payer: Medicaid Other | Source: Ambulatory Visit | Attending: Orthopaedic Surgery | Admitting: Orthopaedic Surgery

## 2017-12-12 ENCOUNTER — Encounter (HOSPITAL_COMMUNITY)
Admission: RE | Admit: 2017-12-12 | Discharge: 2017-12-12 | Disposition: A | Discharge status: Home / Self Care | Payer: Medicaid Other | Source: Ambulatory Visit | Attending: Physician Assistant | Admitting: Physician Assistant

## 2017-12-12 DIAGNOSIS — I498 Other specified cardiac arrhythmias: Secondary | ICD-10-CM | POA: Insufficient documentation

## 2017-12-12 DIAGNOSIS — R918 Other nonspecific abnormal finding of lung field: Secondary | ICD-10-CM | POA: Insufficient documentation

## 2017-12-12 DIAGNOSIS — M1711 Unilateral primary osteoarthritis, right knee: Secondary | ICD-10-CM | POA: Diagnosis present

## 2017-12-12 LAB — TYPE AND SCREEN
ABO/RH(D): O POS
ANTIBODY SCREEN: NEGATIVE

## 2017-12-12 LAB — CBC WITH DIFFERENTIAL/PLATELET
Abs Immature Granulocytes: 0.03 10*3/uL (ref 0.00–0.07)
BASOS ABS: 0 10*3/uL (ref 0.0–0.1)
Basophils Relative: 0 %
Eosinophils Absolute: 0.2 10*3/uL (ref 0.0–0.5)
Eosinophils Relative: 2 %
HEMATOCRIT: 44 % (ref 39.0–52.0)
HEMOGLOBIN: 14.6 g/dL (ref 13.0–17.0)
IMMATURE GRANULOCYTES: 0 %
LYMPHS PCT: 37 %
Lymphs Abs: 3.4 10*3/uL (ref 0.7–4.0)
MCH: 28.7 pg (ref 26.0–34.0)
MCHC: 33.2 g/dL (ref 30.0–36.0)
MCV: 86.6 fL (ref 80.0–100.0)
Monocytes Absolute: 0.6 10*3/uL (ref 0.1–1.0)
Monocytes Relative: 7 %
NEUTROS ABS: 4.9 10*3/uL (ref 1.7–7.7)
NEUTROS PCT: 54 %
Platelets: 205 10*3/uL (ref 150–400)
RBC: 5.08 MIL/uL (ref 4.22–5.81)
RDW: 13.4 % (ref 11.5–15.5)
WBC: 9.2 10*3/uL (ref 4.0–10.5)
nRBC: 0 % (ref 0.0–0.2)

## 2017-12-12 LAB — COMPREHENSIVE METABOLIC PANEL
ALBUMIN: 4.1 g/dL (ref 3.5–5.0)
ALK PHOS: 75 U/L (ref 38–126)
ALT: 24 U/L (ref 0–44)
AST: 21 U/L (ref 15–41)
Anion gap: 8 (ref 5–15)
BUN: 10 mg/dL (ref 6–20)
CO2: 22 mmol/L (ref 22–32)
Calcium: 9.2 mg/dL (ref 8.9–10.3)
Chloride: 108 mmol/L (ref 98–111)
Creatinine, Ser: 0.88 mg/dL (ref 0.61–1.24)
GFR calc Af Amer: >60 mL/min (ref >60)
GFR calc non Af Amer: >60 mL/min (ref >60)
GLUCOSE: 101 mg/dL — AB (ref 70–99)
POTASSIUM: 3.9 mmol/L (ref 3.5–5.1)
Sodium: 138 mmol/L (ref 135–145)
Total Bilirubin: 0.6 mg/dL (ref 0.3–1.2)
Total Protein: 7.4 g/dL (ref 6.5–8.1)

## 2017-12-12 LAB — ABO/RH: ABO/RH(D): O POS

## 2017-12-12 LAB — PROTIME-INR
INR: 0.94
Prothrombin Time: 12.5 seconds (ref 11.4–15.2)

## 2017-12-12 LAB — SURGICAL PCR SCREEN
MRSA, PCR: NEGATIVE
Staphylococcus aureus: NEGATIVE

## 2017-12-12 LAB — APTT: aPTT: 31 seconds (ref 24–36)

## 2017-12-13 ENCOUNTER — Ambulatory Visit: Payer: Self-pay

## 2017-12-14 ENCOUNTER — Ambulatory Visit: Payer: Self-pay

## 2017-12-14 ENCOUNTER — Ambulatory Visit (INDEPENDENT_AMBULATORY_CARE_PROVIDER_SITE_OTHER): Payer: Self-pay

## 2017-12-14 DIAGNOSIS — Z1211 Encounter for screening for malignant neoplasm of colon: Secondary | ICD-10-CM

## 2017-12-14 MED ORDER — NA SULFATE-K SULFATE-MG SULF 17.5-3.13-1.6 GM/177ML PO SOLN: 1.0000 | ORAL | 0 refills | Status: DC

## 2017-12-14 NOTE — Progress Notes (Signed)
Gastroenterology Pre-Procedure Review  Request Date:12/14/17 Requesting Physician: Dr.Fleming no previous tcs  PATIENT REVIEW QUESTIONS: The patient responded to the following health history questions as indicated:    1. Diabetes Melitis: no 2. Joint replacements in the past 12 months: yes, pt is having total knee replacement on 12/19/17- he has stopped all medications except eye drops and tylenol in preparation for surgery) 3. Major health problems in the past 3 months: no 4. Has an artificial valve or MVP: no 5. Has a defibrillator: no 6. Has been advised in past to take antibiotics in advance of a procedure like teeth cleaning: no 7. Family history of colon cancer: no  8. Alcohol Use: yes (occasionally ) 9. History of sleep apnea: no  10. History of coronary artery or other vascular stents placed within the last 12 months: no 11. History of any prior anesthesia complications: no    MEDICATIONS & ALLERGIES:    Patient reports the following regarding taking any blood thinners:   Plavix? no Aspirin? no Coumadin? no Brilinta? no Xarelto? no Eliquis? no Pradaxa? no Savaysa? no Effient? no  Patient confirms/reports the following medications:  Current Outpatient Medications  Medication Sig Dispense Refill  . acetaminophen (TYLENOL) 500 MG tablet Take 500 mg by mouth every 6 (six) hours as needed.    . trimethoprim-polymyxin b (POLYTRIM) ophthalmic solution Place 1 drop into both eyes every 4 (four) hours. (Patient taking differently: Place 1 drop into both eyes 4 (four) times daily. ) 10 mL 0  . diclofenac sodium (VOLTAREN) 1 % GEL Apply 4 g topically 4 (four) times daily. (Patient not taking: Reported on 12/14/2017) 100 g 1  . ibuprofen (ADVIL,MOTRIN) 200 MG tablet Take 400 mg by mouth every 6 (six) hours as needed.     No current facility-administered medications for this visit.     Patient confirms/reports the following allergies:  No Known Allergies  No orders of the  defined types were placed in this encounter.   AUTHORIZATION INFORMATION Pt has [facility] patient assistance  SCHEDULE INFORMATION: Procedure has been scheduled as follows:  Date: 02/08/18, Time: 12:00 Location: [facility] Dr.Rourk  This Gastroenterology Pre-Precedure Review Form is being routed to the following provider(s): Neil Crouch, PA

## 2017-12-14 NOTE — Patient Instructions (Signed)
Willie Parker  Jul 11, 1955 MRN: [id number]     Procedure Date: 02/08/18 Time to register: 11:00am Place to register: [facility] Procedure Time: 12:00pm Scheduled provider: R. Garfield Cornea, MD    PREPARATION FOR COLONOSCOPY WITH SUPREP BOWEL PREP KIT  Note: Suprep Bowel Prep Kit is a split-dose (2day) regimen. Consumption of BOTH 6-ounce bottles is required for a complete prep.  Please notify us immediately if you are diabetic, take iron supplements, or if you are on Coumadin or any other blood thinners.                                                                                                                                                  2 DAYS BEFORE PROCEDURE:  DATE: 02/06/18   DAY: Monday Begin clear liquid diet AFTER your lunch meal. NO SOLID FOODS after this point.  1 DAY BEFORE PROCEDURE:  DATE: 02/07/18  DAY: Tuesday Continue clear liquids the entire day - NO SOLID FOOD.   At 6:00pm: Complete steps 1 through 4 below, using ONE (1) 6-ounce bottle, before going to bed. Step 1:  Pour ONE (1) 6-ounce bottle of SUPREP liquid into the mixing container.  Step 2:  Add cool drinking water to the 16 ounce line on the container and mix.  Note: Dilute the solution concentrate as directed prior to use. Step 3:  DRINK ALL the liquid in the container. Step 4:  You MUST drink an additional two (2) or more 16 ounce containers of water over the next one (1) hour.   Continue clear liquids.  DAY OF PROCEDURE:   DATE: 02/08/18  DAY: Wednesday If you take medications for your heart, blood pressure, or breathing, you may take these medications.   5 hours before your procedure at :7:00am Step 1:  Pour ONE (1) 6-ounce bottle of SUPREP liquid into the mixing container.  Step 2:  Add cool drinking water to the 16 ounce line on the container and mix.  Note: Dilute the solution concentrate as directed prior to use. Step 3:  DRINK ALL the liquid in the container. Step 4:  You  MUST drink an additional two (2) or more 16 ounce containers of water over the next one (1) hour. You MUST complete the final glass of water at least 3 hours before your colonoscopy.   Nothing by mouth past:9:00am  You may take your morning medications with sip of water unless we have instructed otherwise.    Please see below for Dietary Information.  CLEAR LIQUIDS INCLUDE:  Water Jello (NOT red in color)   Ice Popsicles (NOT red in color)   Tea (sugar ok, no milk/cream) Powdered fruit flavored drinks  Coffee (sugar ok, no milk/cream) Gatorade/ Lemonade/ Kool-Aid  (NOT red in color)   Juice: apple, white grape, white cranberry Soft drinks  Clear bullion, consomme, broth (  fat free beef/chicken/vegetable)  Carbonated beverages (any kind)  Strained chicken noodle soup Hard Candy   Remember: Clear liquids are liquids that will allow you to see your fingers on the other side of a clear glass. Be sure liquids are NOT red in color, and not cloudy, but CLEAR.  DO NOT EAT OR DRINK ANY OF THE FOLLOWING:  Dairy products of any kind   Cranberry juice Tomato juice / V8 juice   Grapefruit juice Orange juice     Red grape juice  Do not eat any solid foods, including such foods as: cereal, oatmeal, yogurt, fruits, vegetables, creamed soups, eggs, bread, crackers, pureed foods in a blender, etc.   HELPFUL HINTS FOR DRINKING PREP SOLUTION:   Make sure prep is extremely cold. Mix and refrigerate the the morning of the prep. You may also put in the freezer.   You may try mixing some Crystal Light or Country Time Lemonade if you prefer. Mix in small amounts; add more if necessary.  Try drinking through a straw  Rinse mouth with water or a mouthwash between glasses, to remove after-taste.  Try sipping on a cold beverage /ice/ popsicles between glasses of prep.  Place a piece of sugar-free hard candy in mouth between glasses.  If you become nauseated, try consuming smaller amounts, or stretch out  the time between glasses. Stop for 30-60 minutes, then slowly start back drinking.     OTHER INSTRUCTIONS  You will need a responsible adult at least 61 years of age to accompany you and drive you home. This person must remain in the waiting room during your procedure. The hospital will cancel your procedure if you do not have a responsible adult with you.   1. Wear loose fitting clothing that is easily removed. 2. Leave jewelry and other valuables at home.  3. Remove all body piercing jewelry and leave at home. 4. Total time from sign-in until discharge is approximately 2-3 hours. 5. You should go home directly after your procedure and rest. You can resume normal activities the day after your procedure. 6. The day of your procedure you should not:  Drive  Make legal decisions  Operate machinery  Drink alcohol  Return to work   You may call the office (Dept: [phone]) before 5:00pm, or page the doctor on call ([phone]) after 5:00pm, for further instructions, if necessary.   Insurance Information YOU WILL NEED TO CHECK WITH YOUR INSURANCE COMPANY FOR THE BENEFITS OF COVERAGE YOU HAVE FOR THIS PROCEDURE.  UNFORTUNATELY, NOT ALL INSURANCE COMPANIES HAVE BENEFITS TO COVER ALL OR PART OF THESE TYPES OF PROCEDURES.  IT IS YOUR RESPONSIBILITY TO CHECK YOUR BENEFITS, HOWEVER, WE WILL BE GLAD TO ASSIST YOU WITH ANY CODES YOUR INSURANCE COMPANY MAY NEED.    PLEASE NOTE THAT MOST INSURANCE COMPANIES WILL NOT COVER A SCREENING COLONOSCOPY FOR PEOPLE UNDER THE AGE OF 50  IF YOU HAVE BCBS INSURANCE, YOU MAY HAVE BENEFITS FOR A SCREENING COLONOSCOPY BUT IF POLYPS ARE FOUND THE DIAGNOSIS WILL CHANGE AND THEN YOU MAY HAVE A DEDUCTIBLE THAT WILL NEED TO BE MET. SO PLEASE MAKE SURE YOU CHECK YOUR BENEFITS FOR A SCREENING COLONOSCOPY AS WELL AS A DIAGNOSTIC COLONOSCOPY.      

## 2017-12-15 DIAGNOSIS — M1711 Unilateral primary osteoarthritis, right knee: Secondary | ICD-10-CM

## 2017-12-15 MED FILL — SUPREP BOWEL PREP KIT: 17.5-3.13-1 | 1 days supply | Qty: 354 | Fill #0

## 2017-12-16 MED ORDER — TRANEXAMIC ACID-NACL 1000-0.7 MG/100ML-% IV SOLN
1000.0000 mg | INTRAVENOUS | Status: AC
  Administered 2017-12-19: 1000 mg via INTRAVENOUS
  Filled 2017-12-16: qty 100

## 2017-12-16 MED ORDER — TRANEXAMIC ACID 1000 MG/10ML IV SOLN
2000.0000 mg | INTRAVENOUS | Status: AC
  Administered 2017-12-19: 2000 mg via TOPICAL
  Filled 2017-12-16: qty 20

## 2017-12-19 ENCOUNTER — Inpatient Hospital Stay (HOSPITAL_COMMUNITY): Payer: Medicaid Other

## 2017-12-19 ENCOUNTER — Encounter (HOSPITAL_COMMUNITY): Payer: Self-pay | Admitting: Certified Registered Nurse Anesthetist

## 2017-12-19 ENCOUNTER — Encounter (HOSPITAL_COMMUNITY)
Admission: RE | Disposition: A | Discharge status: Home Health | Payer: Self-pay | Source: Home / Self Care | Attending: Orthopaedic Surgery

## 2017-12-19 ENCOUNTER — Other Ambulatory Visit: Payer: Self-pay

## 2017-12-19 ENCOUNTER — Inpatient Hospital Stay (HOSPITAL_COMMUNITY): Payer: Medicaid Other | Admitting: Certified Registered Nurse Anesthetist

## 2017-12-19 ENCOUNTER — Inpatient Hospital Stay (HOSPITAL_COMMUNITY): Payer: Medicaid Other | Admitting: Physician Assistant

## 2017-12-19 ENCOUNTER — Inpatient Hospital Stay (HOSPITAL_COMMUNITY)
Admission: RE | Admit: 2017-12-19 | Discharge: 2017-12-21 | DRG: 470 | Disposition: A | Discharge status: Home Health | Payer: Medicaid Other | Attending: Orthopaedic Surgery | Admitting: Orthopaedic Surgery

## 2017-12-19 DIAGNOSIS — Z833 Family history of diabetes mellitus: Secondary | ICD-10-CM

## 2017-12-19 DIAGNOSIS — F17210 Nicotine dependence, cigarettes, uncomplicated: Secondary | ICD-10-CM | POA: Diagnosis present

## 2017-12-19 DIAGNOSIS — Z96651 Presence of right artificial knee joint: Secondary | ICD-10-CM

## 2017-12-19 DIAGNOSIS — Z791 Long term (current) use of non-steroidal anti-inflammatories (NSAID): Secondary | ICD-10-CM

## 2017-12-19 DIAGNOSIS — Z8249 Family history of ischemic heart disease and other diseases of the circulatory system: Secondary | ICD-10-CM | POA: Diagnosis not present

## 2017-12-19 DIAGNOSIS — D62 Acute posthemorrhagic anemia: Secondary | ICD-10-CM | POA: Diagnosis not present

## 2017-12-19 DIAGNOSIS — Z79899 Other long term (current) drug therapy: Secondary | ICD-10-CM

## 2017-12-19 DIAGNOSIS — Z96659 Presence of unspecified artificial knee joint: Secondary | ICD-10-CM

## 2017-12-19 DIAGNOSIS — M1711 Unilateral primary osteoarthritis, right knee: Principal | ICD-10-CM

## 2017-12-19 DIAGNOSIS — Z23 Encounter for immunization: Secondary | ICD-10-CM | POA: Diagnosis not present

## 2017-12-19 DIAGNOSIS — E669 Obesity, unspecified: Secondary | ICD-10-CM | POA: Diagnosis present

## 2017-12-19 DIAGNOSIS — Z6825 Body mass index (BMI) 25.0-25.9, adult: Secondary | ICD-10-CM | POA: Diagnosis not present

## 2017-12-19 HISTORY — PX: TOTAL KNEE ARTHROPLASTY: SHX125

## 2017-12-19 SURGERY — ARTHROPLASTY, KNEE, TOTAL
Anesthesia: Spinal | Site: Knee | Laterality: Right

## 2017-12-19 MED ORDER — MIDAZOLAM HCL 2 MG/2ML IJ SOLN
INTRAMUSCULAR | Status: AC
  Filled 2017-12-19: qty 2

## 2017-12-19 MED ORDER — ACETAMINOPHEN 325 MG PO TABS
325.0000 mg | ORAL_TABLET | Freq: Four times a day (QID) | ORAL | Status: DC | PRN
  Administered 2017-12-21: 650 mg via ORAL
  Filled 2017-12-19: qty 2

## 2017-12-19 MED ORDER — PHENOL 1.4 % MT LIQD: 1.0000 | OROMUCOSAL | Status: DC | PRN

## 2017-12-19 MED ORDER — METHOCARBAMOL 500 MG PO TABS
500.0000 mg | ORAL_TABLET | Freq: Four times a day (QID) | ORAL | Status: DC | PRN
  Administered 2017-12-19 – 2017-12-21 (×7): 500 mg via ORAL
  Filled 2017-12-19 (×6): qty 1

## 2017-12-19 MED ORDER — METHOCARBAMOL 500 MG PO TABS
ORAL_TABLET | ORAL | Status: AC
  Administered 2017-12-19: 500 mg via ORAL
  Filled 2017-12-19: qty 1

## 2017-12-19 MED ORDER — MIDAZOLAM HCL 2 MG/2ML IJ SOLN
INTRAMUSCULAR | Status: AC
  Administered 2017-12-19: 2 mg
  Filled 2017-12-19: qty 2

## 2017-12-19 MED ORDER — OXYCODONE HCL 5 MG PO TABS
5.0000 mg | ORAL_TABLET | ORAL | Status: DC | PRN
  Administered 2017-12-19 – 2017-12-21 (×4): 10 mg via ORAL
  Filled 2017-12-19 (×4): qty 2

## 2017-12-19 MED ORDER — FENTANYL CITRATE (PF) 250 MCG/5ML IJ SOLN
INTRAMUSCULAR | Status: AC
  Filled 2017-12-19: qty 5

## 2017-12-19 MED ORDER — EPHEDRINE 5 MG/ML INJ
INTRAVENOUS | Status: AC
  Filled 2017-12-19: qty 10

## 2017-12-19 MED ORDER — SODIUM CHLORIDE 0.9% FLUSH
INTRAVENOUS | Status: DC | PRN
  Administered 2017-12-19: 10 mL

## 2017-12-19 MED ORDER — FENTANYL CITRATE (PF) 250 MCG/5ML IJ SOLN
INTRAMUSCULAR | Status: DC | PRN
  Administered 2017-12-19: 50 ug via INTRAVENOUS

## 2017-12-19 MED ORDER — PROPOFOL 10 MG/ML IV BOLUS
INTRAVENOUS | Status: AC
  Filled 2017-12-19: qty 20

## 2017-12-19 MED ORDER — ALUM & MAG HYDROXIDE-SIMETH 200-200-20 MG/5ML PO SUSP
30.0000 mL | ORAL | Status: DC | PRN
  Administered 2017-12-20 – 2017-12-21 (×2): 30 mL via ORAL
  Filled 2017-12-19 (×2): qty 30

## 2017-12-19 MED ORDER — LACTATED RINGERS IV SOLN
INTRAVENOUS | Status: DC
  Administered 2017-12-19: 08:00:00 via INTRAVENOUS

## 2017-12-19 MED ORDER — FENTANYL CITRATE (PF) 100 MCG/2ML IJ SOLN
INTRAMUSCULAR | Status: AC
  Administered 2017-12-19: 100 ug
  Filled 2017-12-19: qty 2

## 2017-12-19 MED ORDER — DOCUSATE SODIUM 100 MG PO CAPS
100.0000 mg | ORAL_CAPSULE | Freq: Two times a day (BID) | ORAL | Status: DC
  Administered 2017-12-19 – 2017-12-21 (×5): 100 mg via ORAL
  Filled 2017-12-19 (×5): qty 1

## 2017-12-19 MED ORDER — ONDANSETRON HCL 4 MG/2ML IJ SOLN
INTRAMUSCULAR | Status: DC | PRN
  Administered 2017-12-19: 4 mg via INTRAVENOUS

## 2017-12-19 MED ORDER — DEXAMETHASONE SODIUM PHOSPHATE 10 MG/ML IJ SOLN
10.0000 mg | Freq: Once | INTRAMUSCULAR | Status: AC
  Administered 2017-12-20: 10 mg via INTRAVENOUS
  Filled 2017-12-19: qty 1

## 2017-12-19 MED ORDER — METHOCARBAMOL 1000 MG/10ML IJ SOLN
500.0000 mg | Freq: Four times a day (QID) | INTRAVENOUS | Status: DC | PRN
  Filled 2017-12-19: qty 5

## 2017-12-19 MED ORDER — TRANEXAMIC ACID-NACL 1000-0.7 MG/100ML-% IV SOLN
1000.0000 mg | Freq: Once | INTRAVENOUS | Status: AC
  Administered 2017-12-19: 1000 mg via INTRAVENOUS
  Filled 2017-12-19: qty 100

## 2017-12-19 MED ORDER — ASPIRIN EC 81 MG PO TBEC: 81.0000 mg | DELAYED_RELEASE_TABLET | Freq: Two times a day (BID) | ORAL | 0 refills | Status: DC

## 2017-12-19 MED ORDER — SODIUM CHLORIDE 0.9 % IV SOLN
INTRAVENOUS | Status: DC
  Administered 2017-12-19 – 2017-12-20 (×2): via INTRAVENOUS

## 2017-12-19 MED ORDER — CEFAZOLIN SODIUM-DEXTROSE 2-4 GM/100ML-% IV SOLN
2.0000 g | Freq: Four times a day (QID) | INTRAVENOUS | Status: AC
  Administered 2017-12-19 – 2017-12-20 (×3): 2 g via INTRAVENOUS
  Filled 2017-12-19 (×3): qty 100

## 2017-12-19 MED ORDER — CHLORHEXIDINE GLUCONATE 4 % EX LIQD: 60.0000 mL | Freq: Once | CUTANEOUS | Status: DC

## 2017-12-19 MED ORDER — EPHEDRINE SULFATE-NACL 50-0.9 MG/10ML-% IV SOSY
PREFILLED_SYRINGE | INTRAVENOUS | Status: DC | PRN
  Administered 2017-12-19: 5 mg via INTRAVENOUS
  Administered 2017-12-19: 10 mg via INTRAVENOUS
  Administered 2017-12-19 (×2): 5 mg via INTRAVENOUS

## 2017-12-19 MED ORDER — CEFAZOLIN SODIUM-DEXTROSE 2-4 GM/100ML-% IV SOLN
INTRAVENOUS | Status: AC
  Filled 2017-12-19: qty 100

## 2017-12-19 MED ORDER — POLYETHYLENE GLYCOL 3350 17 G PO PACK: 17.0000 g | PACK | Freq: Every day | ORAL | Status: DC | PRN

## 2017-12-19 MED ORDER — SENNOSIDES-DOCUSATE SODIUM 8.6-50 MG PO TABS: 1.0000 | ORAL_TABLET | Freq: Every evening | ORAL | 1 refills | Status: DC | PRN

## 2017-12-19 MED ORDER — VANCOMYCIN HCL 1000 MG IV SOLR
INTRAVENOUS | Status: DC | PRN
  Administered 2017-12-19: 1000 mg via TOPICAL

## 2017-12-19 MED ORDER — METOCLOPRAMIDE HCL 5 MG/ML IJ SOLN: 5.0000 mg | Freq: Three times a day (TID) | INTRAMUSCULAR | Status: DC | PRN

## 2017-12-19 MED ORDER — PROPOFOL 500 MG/50ML IV EMUL
INTRAVENOUS | Status: DC | PRN
  Administered 2017-12-19: 60 ug/kg/min via INTRAVENOUS

## 2017-12-19 MED ORDER — HYDROMORPHONE HCL 1 MG/ML IJ SOLN
INTRAMUSCULAR | Status: AC
  Administered 2017-12-19: 0.5 mg via INTRAVENOUS
  Filled 2017-12-19: qty 1

## 2017-12-19 MED ORDER — FENTANYL CITRATE (PF) 100 MCG/2ML IJ SOLN: 100.0000 ug | Freq: Once | INTRAMUSCULAR | Status: DC

## 2017-12-19 MED ORDER — ASPIRIN 81 MG PO CHEW
81.0000 mg | CHEWABLE_TABLET | Freq: Two times a day (BID) | ORAL | Status: DC
  Administered 2017-12-19 – 2017-12-21 (×4): 81 mg via ORAL
  Filled 2017-12-19 (×4): qty 1

## 2017-12-19 MED ORDER — OXYCODONE HCL ER 10 MG PO T12A: 10.0000 mg | EXTENDED_RELEASE_TABLET | Freq: Two times a day (BID) | ORAL | 0 refills | Status: AC

## 2017-12-19 MED ORDER — SORBITOL 70 % SOLN: 30.0000 mL | Freq: Every day | Status: DC | PRN

## 2017-12-19 MED ORDER — GABAPENTIN 300 MG PO CAPS
300.0000 mg | ORAL_CAPSULE | Freq: Three times a day (TID) | ORAL | Status: DC
  Administered 2017-12-19 – 2017-12-21 (×6): 300 mg via ORAL
  Filled 2017-12-19 (×6): qty 1

## 2017-12-19 MED ORDER — LIDOCAINE 2% (20 MG/ML) 5 ML SYRINGE
INTRAMUSCULAR | Status: DC | PRN
  Administered 2017-12-19: 60 mg via INTRAVENOUS

## 2017-12-19 MED ORDER — VANCOMYCIN HCL 1000 MG IV SOLR
INTRAVENOUS | Status: AC
  Filled 2017-12-19: qty 1000

## 2017-12-19 MED ORDER — ONDANSETRON HCL 4 MG PO TABS: 4.0000 mg | ORAL_TABLET | Freq: Three times a day (TID) | ORAL | 0 refills | Status: DC | PRN

## 2017-12-19 MED ORDER — PROPOFOL 10 MG/ML IV BOLUS
INTRAVENOUS | Status: DC | PRN
  Administered 2017-12-19 (×2): 50 mg via INTRAVENOUS

## 2017-12-19 MED ORDER — ONDANSETRON HCL 4 MG PO TABS: 4.0000 mg | ORAL_TABLET | Freq: Four times a day (QID) | ORAL | Status: DC | PRN

## 2017-12-19 MED ORDER — KETOROLAC TROMETHAMINE 15 MG/ML IJ SOLN
30.0000 mg | Freq: Four times a day (QID) | INTRAMUSCULAR | Status: AC
  Administered 2017-12-19 – 2017-12-20 (×3): 30 mg via INTRAVENOUS
  Filled 2017-12-19 (×3): qty 2

## 2017-12-19 MED ORDER — INFLUENZA VAC SPLIT QUAD 0.5 ML IM SUSY
0.5000 mL | PREFILLED_SYRINGE | INTRAMUSCULAR | Status: AC
  Administered 2017-12-20: 0.5 mL via INTRAMUSCULAR
  Filled 2017-12-19: qty 0.5

## 2017-12-19 MED ORDER — CEFAZOLIN SODIUM-DEXTROSE 2-4 GM/100ML-% IV SOLN
2.0000 g | INTRAVENOUS | Status: AC
  Administered 2017-12-19: 2 g via INTRAVENOUS

## 2017-12-19 MED ORDER — HYDROMORPHONE HCL 1 MG/ML IJ SOLN
0.5000 mg | INTRAMUSCULAR | Status: DC | PRN
  Administered 2017-12-19: 1 mg via INTRAVENOUS
  Administered 2017-12-19: 0.5 mg via INTRAVENOUS
  Administered 2017-12-20: 1 mg via INTRAVENOUS
  Filled 2017-12-19 (×2): qty 1

## 2017-12-19 MED ORDER — LIDOCAINE 2% (20 MG/ML) 5 ML SYRINGE
INTRAMUSCULAR | Status: AC
  Filled 2017-12-19: qty 5

## 2017-12-19 MED ORDER — PROMETHAZINE HCL 25 MG PO TABS: 25.0000 mg | ORAL_TABLET | Freq: Four times a day (QID) | ORAL | 1 refills | Status: DC | PRN

## 2017-12-19 MED ORDER — METOCLOPRAMIDE HCL 5 MG PO TABS: 5.0000 mg | ORAL_TABLET | Freq: Three times a day (TID) | ORAL | Status: DC | PRN

## 2017-12-19 MED ORDER — OXYCODONE HCL 5 MG PO TABS: 5.0000 mg | ORAL_TABLET | ORAL | 0 refills | Status: DC | PRN

## 2017-12-19 MED ORDER — PNEUMOCOCCAL VAC POLYVALENT 25 MCG/0.5ML IJ INJ
0.5000 mL | INJECTION | INTRAMUSCULAR | Status: AC
  Administered 2017-12-20: 0.5 mL via INTRAMUSCULAR
  Filled 2017-12-19: qty 0.5

## 2017-12-19 MED ORDER — ROPIVACAINE HCL 5 MG/ML IJ SOLN
INTRAMUSCULAR | Status: DC | PRN
  Administered 2017-12-19: 30 mL via PERINEURAL

## 2017-12-19 MED ORDER — BUPIVACAINE IN DEXTROSE 0.75-8.25 % IT SOLN
INTRATHECAL | Status: DC | PRN
  Administered 2017-12-19: 1.6 mL via INTRATHECAL

## 2017-12-19 MED ORDER — BUPIVACAINE LIPOSOME 1.3 % IJ SUSP
20.0000 mL | INTRAMUSCULAR | Status: AC
  Administered 2017-12-19: 20 mL
  Filled 2017-12-19: qty 20

## 2017-12-19 MED ORDER — ONDANSETRON HCL 4 MG/2ML IJ SOLN
4.0000 mg | Freq: Four times a day (QID) | INTRAMUSCULAR | Status: DC | PRN
  Administered 2017-12-20: 4 mg via INTRAVENOUS
  Filled 2017-12-19: qty 2

## 2017-12-19 MED ORDER — ACETAMINOPHEN 500 MG PO TABS
1000.0000 mg | ORAL_TABLET | Freq: Four times a day (QID) | ORAL | Status: AC
  Administered 2017-12-19 – 2017-12-20 (×4): 1000 mg via ORAL
  Filled 2017-12-19 (×4): qty 2

## 2017-12-19 MED ORDER — OXYCODONE HCL ER 10 MG PO T12A
10.0000 mg | EXTENDED_RELEASE_TABLET | Freq: Two times a day (BID) | ORAL | Status: DC
  Administered 2017-12-19 – 2017-12-21 (×4): 10 mg via ORAL
  Filled 2017-12-19 (×4): qty 1

## 2017-12-19 MED ORDER — SODIUM CHLORIDE 0.9 % IR SOLN
Status: DC | PRN
  Administered 2017-12-19: 3000 mL

## 2017-12-19 MED ORDER — OXYCODONE HCL 5 MG PO TABS
ORAL_TABLET | ORAL | Status: AC
  Administered 2017-12-19: 10 mg via ORAL
  Filled 2017-12-19: qty 2

## 2017-12-19 MED ORDER — ONDANSETRON HCL 4 MG/2ML IJ SOLN
INTRAMUSCULAR | Status: AC
  Filled 2017-12-19: qty 2

## 2017-12-19 MED ORDER — OXYCODONE HCL 5 MG PO TABS
10.0000 mg | ORAL_TABLET | ORAL | Status: DC | PRN
  Administered 2017-12-19 – 2017-12-20 (×2): 10 mg via ORAL
  Administered 2017-12-21: 15 mg via ORAL
  Filled 2017-12-19: qty 3
  Filled 2017-12-19: qty 2

## 2017-12-19 MED ORDER — MIDAZOLAM HCL 2 MG/2ML IJ SOLN: 2.0000 mg | Freq: Once | INTRAMUSCULAR | Status: DC

## 2017-12-19 MED ORDER — DIPHENHYDRAMINE HCL 12.5 MG/5ML PO ELIX: 25.0000 mg | ORAL_SOLUTION | ORAL | Status: DC | PRN

## 2017-12-19 MED ORDER — MENTHOL 3 MG MT LOZG: 1.0000 | LOZENGE | OROMUCOSAL | Status: DC | PRN

## 2017-12-19 MED ORDER — METHOCARBAMOL 750 MG PO TABS: 750.0000 mg | ORAL_TABLET | Freq: Two times a day (BID) | ORAL | 0 refills | Status: DC | PRN

## 2017-12-19 MED ORDER — CELECOXIB 200 MG PO CAPS
200.0000 mg | ORAL_CAPSULE | Freq: Two times a day (BID) | ORAL | Status: DC
  Administered 2017-12-19 – 2017-12-21 (×5): 200 mg via ORAL
  Filled 2017-12-19 (×5): qty 1

## 2017-12-19 MED ORDER — 0.9 % SODIUM CHLORIDE (POUR BTL) OPTIME
TOPICAL | Status: DC | PRN
  Administered 2017-12-19: 1000 mL

## 2017-12-19 MED ORDER — MIDAZOLAM HCL 5 MG/5ML IJ SOLN
INTRAMUSCULAR | Status: DC | PRN
  Administered 2017-12-19: 2 mg via INTRAVENOUS

## 2017-12-19 MED ORDER — MAGNESIUM CITRATE PO SOLN: 1.0000 | Freq: Once | ORAL | Status: DC | PRN

## 2017-12-19 SURGICAL SUPPLY — 72 items
ALCOHOL ISOPROPYL (RUBBING) (MISCELLANEOUS) ×2 IMPLANT
BAG DECANTER FOR FLEXI CONT (MISCELLANEOUS) ×2 IMPLANT
BANDAGE ESMARK 6X9 LF (GAUZE/BANDAGES/DRESSINGS) ×1 IMPLANT
BEARIN INSERT TIBIAL SZ6 9 (Orthopedic Implant) ×2 IMPLANT
BEARING INSERT TIBIAL SZ6 9 (Orthopedic Implant) ×1 IMPLANT
BLADE SAW SGTL 13.0X1.19X90.0M (BLADE) ×2 IMPLANT
BNDG ELASTIC 6X10 VLCR STRL LF (GAUZE/BANDAGES/DRESSINGS) ×2 IMPLANT
BNDG ESMARK 6X9 LF (GAUZE/BANDAGES/DRESSINGS) ×2
BOWL SMART MIX CTS (DISPOSABLE) ×2 IMPLANT
CLSR STERI-STRIP ANTIMIC 1/2X4 (GAUZE/BANDAGES/DRESSINGS) ×4 IMPLANT
COVER SURGICAL LIGHT HANDLE (MISCELLANEOUS) ×2 IMPLANT
COVER WAND RF STERILE (DRAPES) ×2 IMPLANT
CUFF TOURNIQUET SINGLE 34IN LL (TOURNIQUET CUFF) ×2 IMPLANT
CUFF TOURNIQUET SINGLE 44IN (TOURNIQUET CUFF) IMPLANT
DRAPE EXTREMITY T 121X128X90 (DRAPE) ×2 IMPLANT
DRAPE HALF SHEET 40X57 (DRAPES) ×2 IMPLANT
DRAPE INCISE IOBAN 66X45 STRL (DRAPES) IMPLANT
DRAPE ORTHO SPLIT 77X108 STRL (DRAPES) ×2
DRAPE POUCH INSTRU U-SHP 10X18 (DRAPES) ×2 IMPLANT
DRAPE SURG ORHT 6 SPLT 77X108 (DRAPES) ×2 IMPLANT
DRAPE U-SHAPE 47X51 STRL (DRAPES) ×4 IMPLANT
DURAPREP 26ML APPLICATOR (WOUND CARE) ×4 IMPLANT
ELECT CAUTERY BLADE 6.4 (BLADE) IMPLANT
ELECT REM PT RETURN 9FT ADLT (ELECTROSURGICAL) ×2
ELECTRODE REM PT RTRN 9FT ADLT (ELECTROSURGICAL) ×1 IMPLANT
FEMORAL POSTERIOR SZ6 RT (Femur) ×1 IMPLANT
GAUZE SPONGE 4X4 12PLY STRL LF (GAUZE/BANDAGES/DRESSINGS) ×2 IMPLANT
GLOVE BIOGEL PI IND STRL 7.0 (GLOVE) ×1 IMPLANT
GLOVE BIOGEL PI INDICATOR 7.0 (GLOVE) ×1
GLOVE ECLIPSE 7.0 STRL STRAW (GLOVE) ×6 IMPLANT
GLOVE SKINSENSE NS SZ7.5 (GLOVE) ×1
GLOVE SKINSENSE STRL SZ7.5 (GLOVE) ×1 IMPLANT
GLOVE SURG SYN 7.5  E (GLOVE) ×4
GLOVE SURG SYN 7.5 E (GLOVE) ×4 IMPLANT
GOWN STRL REIN XL XLG (GOWN DISPOSABLE) ×2 IMPLANT
GOWN STRL REUS W/ TWL LRG LVL3 (GOWN DISPOSABLE) ×1 IMPLANT
GOWN STRL REUS W/TWL LRG LVL3 (GOWN DISPOSABLE) ×1
HANDPIECE INTERPULSE COAX TIP (DISPOSABLE) ×1
HOOD PEEL AWAY FLYTE STAYCOOL (MISCELLANEOUS) ×4 IMPLANT
KIT BASIN OR (CUSTOM PROCEDURE TRAY) ×2 IMPLANT
KIT TURNOVER KIT B (KITS) ×2 IMPLANT
KNEE PATELLA ASYMMETRIC 10X35 (Knees) ×2 IMPLANT
KNEE TIBIAL COMPONENT SZ6 (Knees) ×2 IMPLANT
MANIFOLD NEPTUNE II (INSTRUMENTS) ×2 IMPLANT
MARKER SKIN DUAL TIP RULER LAB (MISCELLANEOUS) ×2 IMPLANT
NEEDLE SPNL 18GX3.5 QUINCKE PK (NEEDLE) ×4 IMPLANT
NS IRRIG 1000ML POUR BTL (IV SOLUTION) ×2 IMPLANT
PACK TOTAL JOINT (CUSTOM PROCEDURE TRAY) ×2 IMPLANT
PAD ABD 8X10 STRL (GAUZE/BANDAGES/DRESSINGS) ×2 IMPLANT
PAD ARMBOARD 7.5X6 YLW CONV (MISCELLANEOUS) ×4 IMPLANT
PAD CAST 4YDX4 CTTN HI CHSV (CAST SUPPLIES) IMPLANT
PADDING CAST COTTON 4X4 STRL (CAST SUPPLIES)
PADDING CAST COTTON 6X4 STRL (CAST SUPPLIES) ×2 IMPLANT
POSTERIOR FEMORAL SZ6 RT (Femur) ×2 IMPLANT
SAW OSC TIP CART 19.5X105X1.3 (SAW) ×2 IMPLANT
SET HNDPC FAN SPRY TIP SCT (DISPOSABLE) ×1 IMPLANT
STAPLER VISISTAT 35W (STAPLE) IMPLANT
SUCTION FRAZIER HANDLE 10FR (MISCELLANEOUS) ×1
SUCTION TUBE FRAZIER 10FR DISP (MISCELLANEOUS) ×1 IMPLANT
SUT ETHILON 2 0 FS 18 (SUTURE) IMPLANT
SUT MNCRL AB 4-0 PS2 18 (SUTURE) IMPLANT
SUT VIC AB 0 CT1 27 (SUTURE) ×2
SUT VIC AB 0 CT1 27XBRD ANBCTR (SUTURE) ×2 IMPLANT
SUT VIC AB 1 CTX 27 (SUTURE) ×6 IMPLANT
SUT VIC AB 2-0 CT1 27 (SUTURE) ×3
SUT VIC AB 2-0 CT1 TAPERPNT 27 (SUTURE) ×3 IMPLANT
SYR 50ML LL SCALE MARK (SYRINGE) ×4 IMPLANT
TOWEL OR 17X24 6PK STRL BLUE (TOWEL DISPOSABLE) ×2 IMPLANT
TOWEL OR 17X26 10 PK STRL BLUE (TOWEL DISPOSABLE) ×2 IMPLANT
TRAY CATH 16FR W/PLASTIC CATH (SET/KITS/TRAYS/PACK) ×2 IMPLANT
UNDERPAD 30X30 (UNDERPADS AND DIAPERS) ×2 IMPLANT
WRAP KNEE MAXI GEL POST OP (GAUZE/BANDAGES/DRESSINGS) ×2 IMPLANT

## 2017-12-19 NOTE — Anesthesia Procedure Notes (Signed)
Procedure Name: MAC Date/Time: 12/19/2017 10:28 AM Performed by: Alain Marion, CRNA Pre-anesthesia Checklist: Patient identified, Emergency Drugs available, Suction available and Patient being monitored Patient Re-evaluated:Patient Re-evaluated prior to induction Oxygen Delivery Method: Simple face mask Placement Confirmation: positive ETCO2 Dental Injury: Teeth and Oropharynx as per pre-operative assessment

## 2017-12-19 NOTE — Op Note (Signed)
Total Knee Arthroplasty Procedure Note  Preoperative diagnosis: Right knee osteoarthritis  Postoperative diagnosis:same  Operative procedure: Right total knee arthroplasty. CPT [phone]  Surgeon: N. Eduard Roux, MD  Assist: Madalyn Rob, PA-C; necessary for the timely completion of procedure and due to complexity of procedure.  Anesthesia: Spinal, regional  Tourniquet time: less than 60 mins  Implants used: [employer] Triatholon uncemented Femur: PS 5 Tibia: 5 Patella: 35 mm Polyethylene: 9 mm  Indication: Avion Kutzer is a 62 y.o. year old male with a history of knee pain. Having failed conservative management, the patient elected to proceed with a total knee arthroplasty.  We have reviewed the risk and benefits of the surgery and they elected to proceed after voicing understanding.  Procedure:  After informed consent was obtained and understanding of the risk were voiced including but not limited to bleeding, infection, damage to surrounding structures including nerves and vessels, blood clots, leg length inequality and the failure to achieve desired results, the operative extremity was marked with verbal confirmation of the patient in the holding area.   The patient was then brought to the operating room and transported to the operating room table in the supine position.  A tourniquet was applied to the operative extremity around the upper thigh. The operative limb was then prepped and draped in the usual sterile fashion and preoperative antibiotics were administered.  A time out was performed prior to the start of surgery confirming the correct extremity, preoperative antibiotic administration, as well as team members, implants and instruments available for the case. Correct surgical site was also confirmed with preoperative radiographs. The limb was then elevated for exsanguination and the tourniquet was inflated. A midline incision was made and a standard medial  parapatellar approach was performed.  The patella was prepared and sized to a 35 mm.  A cover was placed on the patella for protection from retractors.  We then turned our attention to the femur. Posterior cruciate ligament was sacrificed. Start site was drilled in the femur and the intramedullary distal femoral cutting guide was placed, set at 5 degrees valgus, taking 9 mm of distal resection. The distal cut was made. Osteophytes were then removed. Next, the proximal tibial cutting guide was placed with appropriate slope, varus/valgus alignment and depth of resection. The proximal tibial cut was made. Gap blocks were then used to assess the extension gap and alignment, and appropriate soft tissue releases were performed. Attention was turned back to the femur, which was sized using the sizing guide to a size 5. Appropriate rotation of the femoral component was determined using epicondylar axis, Whiteside's line, and assessing the flexion gap under ligament tension. The appropriate size 4-in-1 cutting block was placed and cuts were made. Posterior femoral osteophytes and uncapped bone were then removed with the curved osteotome. The tibia was sized for a size 5 component. The femoral box-cutting guide was placed and prepared for a PS femoral component. Trial components were placed, and stability was checked in full extension, mid-flexion, and deep flexion. Proper tibial rotation was determined and marked.  The patella tracked well without a lateral release. Trial components were then removed and tibial preparation performed. A posterior capsular injection comprising of 20 cc of 1.3% exparel and 40 cc of normal saline was performed for postoperative pain control. The bony surfaces were irrigated with a pulse lavage and then dried. The final components sized above were malleted into place. The stability of the construct was re-evaluated throughout a range of motion  and found to be acceptable. The trial liner was  removed, the knee was copiously irrigated, and the knee was re-evaluated for any excess bone debris. The real polyethylene liner, 9 mm thick, was inserted and checked to ensure the locking mechanism had engaged appropriately. The tourniquet was deflated and hemostasis was achieved. The wound was irrigated with normal saline.  One gram of vancomycin powder was placed in the surgical bed.  A drain was not placed.  Capsular closure was performed with a #1 vicryl, subcutaneous fat closed with a 0 vicryl suture, then subcutaneous tissue closed with interrupted 2.0 vicryl suture. The skin was then closed with a 3.0 monocryl. A sterile dressing was applied.  The patient was awakened in the operating room and taken to recovery in stable condition. All sponge, needle, and instrument counts were correct at the end of the case.  Position: supine  Complications: none.  Time Out: performed   Drains/Packing: none  Estimated blood loss: minimal  Returned to Recovery Room: in good condition.   Antibiotics: yes   Mechanical VTE (DVT) Prophylaxis: sequential compression devices, TED thigh-high  Chemical VTE (DVT) Prophylaxis: aspirin  Fluid Replacement  Crystalloid: see anesthesia record Blood: none  FFP: none   Specimens Removed: 1 to pathology   Sponge and Instrument Count Correct? yes   PACU: portable radiograph - knee AP and Lateral   Admission: inpatient status  Plan/RTC: Return in 2 weeks for wound check.   Weight Bearing/Load Lower Extremity: full   N. Eduard Roux, MD [place] 11:53 AM

## 2017-12-19 NOTE — Anesthesia Procedure Notes (Signed)
Anesthesia Regional Block: Adductor canal block   Pre-Anesthetic Checklist: ,, timeout performed, Correct Patient, Correct Site, Correct Laterality, Correct Procedure, Correct Position, site marked, Risks and benefits discussed,  Surgical consent,  Pre-op evaluation,  At surgeon's request and post-op pain management  Laterality: Right and Lower  Prep: Maximum Sterile Barrier Precautions used, chloraprep       Needles:  Injection technique: Single-shot  Needle Type: Echogenic Stimulator Needle     Needle Length: 10cm      Additional Needles:   Procedures:,,,, ultrasound used (permanent image in chart),,,,  Narrative:  Start time: 12/19/2017 9:07 AM End time: 12/19/2017 9:17 AM Injection made incrementally with aspirations every 5 mL.  Performed by: Personally  Anesthesiologist: Montez Hageman, MD  Additional Notes: Risks, benefits and alternative to block explained extensively.  Patient tolerated procedure well, without complications.

## 2017-12-19 NOTE — Progress Notes (Signed)
Orthopedic Tech Progress Note Patient Details:  Willie Parker 23-May-1955 [id number]  CPM Right Knee CPM Right Knee: On Right Knee Flexion (Degrees): 90 Right Knee Extension (Degrees): 0 Additional Comments: trapeze bar patient helper  Post Interventions Patient Tolerated: Well Instructions Provided: Care of device Viewed order from doctor's order list Hildred Priest 12/19/2017, 1:43 PM

## 2017-12-19 NOTE — Transfer of Care (Signed)
Immediate Anesthesia Transfer of Care Note  Patient: Willie Parker  Procedure(s) Performed: RIGHT TOTAL KNEE ARTHROPLASTY (Right Knee)  Patient Location: PACU  Anesthesia Type:MAC combined with regional for post-op pain  Level of Consciousness: awake, alert  and oriented  Airway & Oxygen Therapy: Patient Spontanous Breathing and Patient connected to nasal cannula oxygen  Post-op Assessment: Report given to RN and Post -op Vital signs reviewed and stable  Post vital signs: Reviewed and stable  Last Vitals:  Vitals Value Taken Time  BP 109/70 12/19/2017 12:29 PM  Temp    Pulse 54 12/19/2017 12:41 PM  Resp 13 12/19/2017 12:41 PM  SpO2 98 % 12/19/2017 12:41 PM  Vitals shown include unvalidated device data.  Last Pain:  Vitals:   12/19/17 0825  TempSrc: Oral  PainSc:          Complications: No apparent anesthesia complications

## 2017-12-19 NOTE — Discharge Instructions (Signed)

## 2017-12-19 NOTE — Plan of Care (Signed)
Problem: Education: Goal: Knowledge of General Education information will improve Description Including pain rating scale, medication(s)/side effects and non-pharmacologic comfort measures Outcome: Progressing   Problem: Health Behavior/Discharge Planning: Goal: Ability to manage health-related needs will improve Outcome: Progressing   Problem: Clinical Measurements: Goal: Respiratory complications will improve Outcome: Progressing   Problem: Nutrition: Goal: Adequate nutrition will be maintained Outcome: Progressing   Problem: Coping: Goal: Level of anxiety will decrease Outcome: Progressing   Problem: Pain Managment: Goal: General experience of comfort will improve Outcome: Progressing   Problem: Safety: Goal: Ability to remain free from injury will improve Outcome: Progressing   Problem: Skin Integrity: Goal: Risk for impaired skin integrity will decrease Outcome: Progressing   Problem: Education: Goal: Knowledge of the prescribed therapeutic regimen will improve Outcome: Progressing   Problem: Pain Management: Goal: Pain level will decrease with appropriate interventions Outcome: Progressing   Problem: Skin Integrity: Goal: Will show signs of wound healing Outcome: Progressing

## 2017-12-19 NOTE — H&P (Signed)
PREOPERATIVE H&P  Chief Complaint: right knee degenerative joint disease  HPI: Willie Parker is a 62 y.o. male who presents for surgical treatment of right knee degenerative joint disease.  He denies any changes in medical history.  Past Medical History:  Diagnosis Date  . Arthritis   . Knee pain    Past Surgical History:  Procedure Laterality Date  . MANDIBLE FRACTURE SURGERY     when he was 62 year old   Social History   Socioeconomic History  . Marital status: Divorced    Spouse name: Not on file  . Number of children: Not on file  . Years of education: Not on file  . Highest education level: Not on file  Occupational History  . Not on file  Social Needs  . Financial resource strain: Not on file  . Food insecurity:    Worry: Not on file    Inability: Not on file  . Transportation needs:    Medical: Not on file    Non-medical: Not on file  Tobacco Use  . Smoking status: Current Every Day Smoker    Packs/day: 0.25    Types: Cigarettes  . Smokeless tobacco: Never Used  Substance and Sexual Activity  . Alcohol use: Not Currently    Comment: has not had alcohol for over a month  . Drug use: Never  . Sexual activity: Not Currently  Lifestyle  . Physical activity:    Days per week: Not on file    Minutes per session: Not on file  . Stress: Not on file  Relationships  . Social connections:    Talks on phone: Not on file    Gets together: Not on file    Attends religious service: Not on file    Active member of club or organization: Not on file    Attends meetings of clubs or organizations: Not on file    Relationship status: Not on file  Other Topics Concern  . Not on file  Social History Narrative  . Not on file   Family History  Problem Relation Age of Onset  . Diabetes Mother   . Hypertension Mother    No Known Allergies Prior to Admission medications   Medication Sig Start Date End Date Taking? Authorizing Provider  diclofenac sodium  (VOLTAREN) 1 % GEL Apply 4 g topically 4 (four) times daily. Patient not taking: Reported on 12/14/2017 11/15/17  Yes Gildardo Pounds, NP  ibuprofen (ADVIL,MOTRIN) 200 MG tablet Take 400 mg by mouth every 6 (six) hours as needed.   Yes [provider]  trimethoprim-polymyxin b (POLYTRIM) ophthalmic solution Place 1 drop into both eyes every 4 (four) hours. Patient taking differently: Place 1 drop into both eyes 4 (four) times daily.  11/24/17  Yes Virgel Manifold, MD  acetaminophen (TYLENOL) 500 MG tablet Take 500 mg by mouth every 6 (six) hours as needed.    [provider]  Na Sulfate-K Sulfate-Mg Sulf (SUPREP BOWEL PREP KIT) 17.5-3.13-1.6 GM/177ML SOLN Take 1 kit by mouth as directed. 12/14/17   Mahala Menghini, PA-C     Positive ROS: All other systems have been reviewed and were otherwise negative with the exception of those mentioned in the HPI and as above.  Physical Exam: General: Alert, no acute distress Cardiovascular: No pedal edema Respiratory: No cyanosis, no use of accessory musculature GI: abdomen soft Skin: No lesions in the area of chief complaint Neurologic: Sensation intact distally Psychiatric: Patient is competent for consent  with normal mood and affect Lymphatic: no lymphedema  MUSCULOSKELETAL: exam stable  Assessment: right knee degenerative joint disease  Plan: Plan for Procedure(s): RIGHT TOTAL KNEE ARTHROPLASTY  The risks benefits and alternatives were discussed with the patient including but not limited to the risks of nonoperative treatment, versus surgical intervention including infection, bleeding, nerve injury,  blood clots, cardiopulmonary complications, morbidity, mortality, among others, and they were willing to proceed.   Preoperative templating of the joint replacement has been completed, documented, and submitted to the Operating Room personnel in order to optimize intra-operative equipment management.  Anticipated LOS equal to or  greater than 2 midnights due to - Age 34 and older with one or more of the following:  - Obesity  - Expected need for hospital services (PT, OT, Nursing) required for safe  discharge  - Anticipated need for postoperative skilled nursing care or inpatient rehab  - Active co-morbidities: None  Eduard Roux, MD   12/19/2017 7:21 AM

## 2017-12-19 NOTE — Progress Notes (Signed)
Pt arrived to room 5N04 via bed. Received report from Richardson Landry, [place] in PACU. See assessment. Will continue to monitor.

## 2017-12-19 NOTE — Anesthesia Postprocedure Evaluation (Signed)
Anesthesia Post Note  Patient: Willie Parker  Procedure(s) Performed: RIGHT TOTAL KNEE ARTHROPLASTY (Right Knee)     Patient location during evaluation: PACU Anesthesia Type: Spinal Level of consciousness: awake and alert Pain management: pain level controlled Vital Signs Assessment: post-procedure vital signs reviewed and stable Respiratory status: spontaneous breathing and respiratory function stable Cardiovascular status: blood pressure returned to baseline and stable Postop Assessment: no headache, no backache, spinal receding and no apparent nausea or vomiting Anesthetic complications: no    Last Vitals:  Vitals:   12/19/17 1300 12/19/17 1315  BP: 116/75 116/73  Pulse: (!) 51 (!) 45  Resp: 12 10  Temp:    SpO2: 98% 97%    Last Pain:  Vitals:   12/19/17 1315  TempSrc:   PainSc: 8                  Montez Hageman

## 2017-12-19 NOTE — Anesthesia Procedure Notes (Signed)
Spinal  Patient location during procedure: OR Staffing Anesthesiologist: Deoni Cosey, MD Performed: anesthesiologist  Preanesthetic Checklist Completed: patient identified, site marked, surgical consent, pre-op evaluation, timeout performed, IV checked, risks and benefits discussed and monitors and equipment checked Spinal Block Patient position: sitting Prep: DuraPrep Patient monitoring: heart rate, continuous pulse ox and blood pressure Approach: right paramedian Location: L3-4 Injection technique: single-shot Needle Needle type: Sprotte  Needle gauge: 24 G Needle length: 9 cm Additional Notes Expiration date of kit checked and confirmed. Patient tolerated procedure well, without complications.       

## 2017-12-19 NOTE — Anesthesia Preprocedure Evaluation (Signed)
Anesthesia Evaluation  Patient identified by MRN, date of birth, ID band Patient awake    Reviewed: Allergy & Precautions, NPO status , Patient's Chart, lab work & pertinent test results  Airway Mallampati: II  TM Distance: >3 FB Neck ROM: Full    Dental no notable dental hx. (+) Poor Dentition, Missing, Loose   Pulmonary Current Smoker,    Pulmonary exam normal breath sounds clear to auscultation       Cardiovascular negative cardio ROS Normal cardiovascular exam Rhythm:Regular Rate:Normal     Neuro/Psych negative neurological ROS  negative psych ROS   GI/Hepatic negative GI ROS, Neg liver ROS,   Endo/Other  negative endocrine ROS  Renal/GU negative Renal ROS  negative genitourinary   Musculoskeletal negative musculoskeletal ROS (+)   Abdominal   Peds negative pediatric ROS (+)  Hematology negative hematology ROS (+)   Anesthesia Other Findings   Reproductive/Obstetrics negative OB ROS                             Anesthesia Physical Anesthesia Plan  ASA: II  Anesthesia Plan: Spinal   Post-op Pain Management:  Regional for Post-op pain   Induction: Intravenous  PONV Risk Score and Plan: Treatment may vary due to age or medical condition  Airway Management Planned: Simple Face Mask  Additional Equipment:   Intra-op Plan:   Post-operative Plan:   Informed Consent: I have reviewed the patients History and Physical, chart, labs and discussed the procedure including the risks, benefits and alternatives for the proposed anesthesia with the patient or authorized representative who has indicated his/her understanding and acceptance.   Dental advisory given  Plan Discussed with: CRNA  Anesthesia Plan Comments:         Anesthesia Quick Evaluation

## 2017-12-19 NOTE — Evaluation (Signed)
Physical Therapy Evaluation Patient Details Name: Willie Parker MRN: [id number] DOB: 06-18-1955 Today's Date: 12/19/2017   History of Present Illness  Pt is a 62 y/o male s/p R TKA. PMH includes arthritis.   Clinical Impression  Pt is s/p surgery above with deficits below. Pt very limited by pain this session and only able to tolerate stand pivot transfer to chair. Required min A for mobility using RW. Educated about knee precautions and supine HEP. Will continue to follow acutely to maximize functional mobility independence and safety.     Follow Up Recommendations Follow surgeon's recommendation for DC plan and follow-up therapies;Supervision for mobility/OOB    Equipment Recommendations  3in1 (PT);Rolling walker with 5" wheels    Recommendations for Other Services       Precautions / Restrictions Precautions Precautions: Knee Precaution Booklet Issued: Yes (comment) Precaution Comments: Reviewed knee precautions and supine HEP.  Restrictions Weight Bearing Restrictions: Yes RLE Weight Bearing: Weight bearing as tolerated      Mobility  Bed Mobility Overal bed mobility: Needs Assistance Bed Mobility: Supine to Sit     Supine to sit: Min assist     General bed mobility comments: Min A for RLE assist.   Transfers Overall transfer level: Needs assistance Equipment used: Rolling walker (2 wheeled) Transfers: Sit to/from [facility] Sit to Stand: Min assist Stand pivot transfers: Min assist       General transfer comment: Min A For lift assist and steadying. Verbal cues for safe hand placement. Pt limited secondary to increased pain and only able to perform stand pivot. Required min A for steadying and verbal cues for sequencing.   Ambulation/Gait             General Gait Details: Unable secondary to pain   Stairs            Wheelchair Mobility    Modified Rankin (Stroke Patients Only)       Balance Overall balance  assessment: Needs assistance Sitting-balance support: No upper extremity supported;Feet supported Sitting balance-Leahy Scale: Good     Standing balance support: Bilateral upper extremity supported;During functional activity Standing balance-Leahy Scale: Poor Standing balance comment: Reliant on BUE support.                              Pertinent Vitals/Pain Pain Assessment: 0-10 Pain Score: 8  Pain Location: R knee  Pain Descriptors / Indicators: Aching;Operative site guarding Pain Intervention(s): Limited activity within patient's tolerance;Monitored during session;Repositioned    Home Living Family/patient expects to be discharged to:: Private residence Living Arrangements: Other relatives;Parent(sister; brother ) Available Help at Discharge: Family;Available 24 hours/day Type of Home: House Home Access: Stairs to enter Entrance Stairs-Rails: None Entrance Stairs-Number of Steps: 1(threshold ) Home Layout: One level Home Equipment: None      Prior Function Level of Independence: Independent               Hand Dominance   Dominant Hand: Right    Extremity/Trunk Assessment   Upper Extremity Assessment Upper Extremity Assessment: Overall WFL for tasks assessed    Lower Extremity Assessment Lower Extremity Assessment: RLE deficits/detail RLE Deficits / Details: Deficits consistent with post op pain and weakness. Able to perform ther ex below.     Cervical / Trunk Assessment Cervical / Trunk Assessment: Normal  Communication   Communication: No difficulties  Cognition Arousal/Alertness: Awake/alert Behavior During Therapy: WFL for tasks assessed/performed Overall Cognitive Status: Within Functional  Limits for tasks assessed                                        General Comments General comments (skin integrity, edema, etc.): Pt's daughter and sister present during session.     Exercises Total Joint Exercises Ankle  Circles/Pumps: AROM;Both;20 reps Quad Sets: AROM;Right;10 reps   Assessment/Plan    PT Assessment Patient needs continued PT services  PT Problem List Decreased strength;Decreased balance;Decreased activity tolerance;Decreased mobility;Decreased range of motion;Decreased knowledge of use of DME;Decreased knowledge of precautions;Pain       PT Treatment Interventions DME instruction;Gait training;Stair training;Functional mobility training;Therapeutic activities;Therapeutic exercise;Balance training;Patient/family education    PT Goals (Current goals can be found in the Care Plan section)  Acute Rehab PT Goals Patient Stated Goal: for pain to decrease PT Goal Formulation: With patient Time For Goal Achievement: 01/02/18 Potential to Achieve Goals: Good    Frequency 7X/week   Barriers to discharge        Co-evaluation               AM-PAC PT "6 Clicks" Daily Activity  Outcome Measure Difficulty turning over in bed (including adjusting bedclothes, sheets and blankets)?: A Little Difficulty moving from lying on back to sitting on the side of the bed? : Unable Difficulty sitting down on and standing up from a chair with arms (e.g., wheelchair, bedside commode, etc,.)?: Unable Help needed moving to and from a bed to chair (including a wheelchair)?: A Little Help needed walking in hospital room?: A Lot Help needed climbing 3-5 steps with a railing? : A Lot 6 Click Score: 12    End of Session Equipment Utilized During Treatment: Gait belt Activity Tolerance: Patient limited by pain Patient left: in chair;with call bell/phone within reach;with family/visitor present Nurse Communication: Mobility status PT Visit Diagnosis: Other abnormalities of gait and mobility (R26.89);Muscle weakness (generalized) (M62.81);Pain Pain - Right/Left: Right Pain - part of body: Knee    Time: 1731-1751 PT Time Calculation (min) (ACUTE ONLY): 20 min   Charges:   PT Evaluation $PT Eval Low  Complexity: [place], PT, DPT  Acute Rehabilitation Services  Pager: [phone] Office: [phone]   Rudean Hitt 12/19/2017, 6:19 PM

## 2017-12-20 ENCOUNTER — Encounter (HOSPITAL_COMMUNITY): Payer: Self-pay | Admitting: Orthopaedic Surgery

## 2017-12-20 LAB — CBC
HCT: 36.6 % — ABNORMAL LOW (ref 39.0–52.0)
HEMOGLOBIN: 12.2 g/dL — AB (ref 13.0–17.0)
MCH: 28.9 pg (ref 26.0–34.0)
MCHC: 33.3 g/dL (ref 30.0–36.0)
MCV: 86.7 fL (ref 80.0–100.0)
Platelets: 164 10*3/uL (ref 150–400)
RBC: 4.22 MIL/uL (ref 4.22–5.81)
RDW: 14 % (ref 11.5–15.5)
WBC: 8.4 10*3/uL (ref 4.0–10.5)
nRBC: 0 % (ref 0.0–0.2)

## 2017-12-20 LAB — BASIC METABOLIC PANEL
Anion gap: 9 (ref 5–15)
BUN: 8 mg/dL (ref 6–20)
CO2: 23 mmol/L (ref 22–32)
Calcium: 8.3 mg/dL — ABNORMAL LOW (ref 8.9–10.3)
Chloride: 102 mmol/L (ref 98–111)
Creatinine, Ser: 0.97 mg/dL (ref 0.61–1.24)
GFR calc Af Amer: >60 mL/min (ref >60)
GFR calc non Af Amer: >60 mL/min (ref >60)
Glucose, Bld: 112 mg/dL — ABNORMAL HIGH (ref 70–99)
Potassium: 3.4 mmol/L — ABNORMAL LOW (ref 3.5–5.1)
Sodium: 134 mmol/L — ABNORMAL LOW (ref 135–145)

## 2017-12-20 NOTE — Progress Notes (Signed)
Physical Therapy Treatment Patient Details Name: Willie Parker MRN: [id number] DOB: 12/21/1955 Today's Date: 12/20/2017    History of Present Illness Pt is a 62 y/o male s/p R TKA. PMH includes arthritis.     PT Comments    Patient continues to make progress toward PT goals and tolerated mobility well. Current plan remains appropriate.    Follow Up Recommendations  Follow surgeon's recommendation for DC plan and follow-up therapies;Supervision for mobility/OOB     Equipment Recommendations  3in1 (PT);Rolling walker with 5" wheels    Recommendations for Other Services       Precautions / Restrictions Precautions Precautions: Knee Precaution Comments: Reviewed knee precautions and positioning   Restrictions Weight Bearing Restrictions: Yes RLE Weight Bearing: Weight bearing as tolerated    Mobility  Bed Mobility Overal bed mobility: Modified Independent Bed Mobility: Sit to Supine           General bed mobility comments: increased time and effort; no use of rails  Transfers Overall transfer level: Needs assistance Equipment used: Rolling walker (2 wheeled) Transfers: Sit to/from Stand Sit to Stand: Min guard         General transfer comment: for safety  Ambulation/Gait Ambulation/Gait assistance: Min guard Gait Distance (Feet): 230 Feet Assistive device: Rolling walker (2 wheeled) Gait Pattern/deviations: Step-through pattern;Decreased stance time - right;Decreased step length - left;Decreased weight shift to right Gait velocity: decreased   General Gait Details: cues for posture and step length symmetry    Stairs             Wheelchair Mobility    Modified Rankin (Stroke Patients Only)       Balance Overall balance assessment: Needs assistance Sitting-balance support: No upper extremity supported;Feet supported Sitting balance-Leahy Scale: Good     Standing balance support: Bilateral upper extremity supported;During functional  activity Standing balance-Leahy Scale: Poor                              Cognition Arousal/Alertness: Awake/alert Behavior During Therapy: WFL for tasks assessed/performed Overall Cognitive Status: Within Functional Limits for tasks assessed                                        Exercises Total Joint Exercises Ankle Circles/Pumps: AROM;Both Quad Sets: AROM;Right Short Arc Quad: AROM;Right Heel Slides: AROM;AAROM;Right Hip ABduction/ADduction: AROM;Right Straight Leg Raises: AROM;Right    General Comments        Pertinent Vitals/Pain Pain Location: R knee  Pain Descriptors / Indicators: Operative site guarding;Sore    Home Living                      Prior Function            PT Goals (current goals can now be found in the care plan section) Acute Rehab PT Goals PT Goal Formulation: With patient Time For Goal Achievement: 01/02/18 Potential to Achieve Goals: Good Progress towards PT goals: Progressing toward goals    Frequency    7X/week      PT Plan Current plan remains appropriate    Co-evaluation              AM-PAC PT "6 Clicks" Daily Activity  Outcome Measure  Difficulty turning over in bed (including adjusting bedclothes, sheets and blankets)?: A Little Difficulty moving from lying on back to  sitting on the side of the bed? : A Lot Difficulty sitting down on and standing up from a chair with arms (e.g., wheelchair, bedside commode, etc,.)?: Unable Help needed moving to and from a bed to chair (including a wheelchair)?: A Little Help needed walking in hospital room?: A Little Help needed climbing 3-5 steps with a railing? : A Little 6 Click Score: 15    End of Session Equipment Utilized During Treatment: Gait belt Activity Tolerance: Patient tolerated treatment well Patient left: with family/visitor present;in bed;in CPM;with call bell/phone within reach Nurse Communication: Mobility status PT Visit  Diagnosis: Other abnormalities of gait and mobility (R26.89);Muscle weakness (generalized) (M62.81);Pain Pain - Right/Left: Right Pain - part of body: Knee     Time: 1884-1660 PT Time Calculation (min) (ACUTE ONLY): 36 min  Charges:  $Gait Training: 8-22 mins $Therapeutic Exercise: 8-22 mins                     Willie Parker, PTA Acute Rehabilitation Services Pager: [phone] Office: [phone]     Darliss Cheney 12/20/2017, 4:31 PM

## 2017-12-20 NOTE — Care Management Note (Signed)
Case Management Note  Patient Details  Name: Landan Fedie MRN: [id number] Date of Birth: Jul 19, 1955  Subjective/Objective:   62 yr old gentleman s/p right total knee arthroplasty.                 Action/Plan: Case manager spoke with patient and wife concerning discharge plan and DME. Patient was preoperatively setup with [facility], no changes. He will have support of family at discharge.   Expected Discharge Date:  12/20/17               Expected Discharge Plan:  [place]  In-House Referral:  NA  Discharge planning Services  CM Consult  Post Acute Care Choice:  Durable Medical Equipment, Home Health Choice offered to:  Patient  DME Arranged:  3-N-1, Walker rolling DME Agency:  [place]:  PT [place] Agency:  [facility] (formerly [facility] [address])  Status of Service:  Completed, signed off  If discussed at [employer], dates discussed:    Additional Comments:  Ninfa Meeker, RN 12/20/2017, 10:32 AM

## 2017-12-20 NOTE — Progress Notes (Signed)
Physical Therapy Treatment Patient Details Name: Willie Parker MRN: [id number] DOB: 1955-08-29 Today's Date: 12/20/2017    History of Present Illness Pt is a 62 y/o male s/p R TKA. PMH includes arthritis.     PT Comments    Patient is progressing well toward PT goals. Pt tolerated gait and stair training without c/o increased pain. Continue to progress as tolerated.    Follow Up Recommendations  Follow surgeon's recommendation for DC plan and follow-up therapies;Supervision for mobility/OOB     Equipment Recommendations  3in1 (PT);Rolling walker with 5" wheels    Recommendations for Other Services       Precautions / Restrictions Precautions Precautions: Knee Precaution Comments: Reviewed knee precautions and positioning   Restrictions Weight Bearing Restrictions: Yes RLE Weight Bearing: Weight bearing as tolerated    Mobility  Bed Mobility Overal bed mobility: Modified Independent Bed Mobility: Supine to Sit           General bed mobility comments: increased time and effort; no use of rails  Transfers Overall transfer level: Needs assistance Equipment used: Rolling walker (2 wheeled) Transfers: Sit to/from Stand Sit to Stand: Min guard         General transfer comment: cues for safe hand placement  Ambulation/Gait Ambulation/Gait assistance: Min guard Gait Distance (Feet): 200 Feet Assistive device: Rolling walker (2 wheeled) Gait Pattern/deviations: Step-through pattern;Decreased stance time - right;Decreased step length - left;Decreased weight shift to right Gait velocity: decreased   General Gait Details: cues for R heel strike/toe off and posture; pt with improving stride length and step length symmetry with increased distance    Stairs Stairs: Yes Stairs assistance: Min assist Stair Management: No rails;Step to pattern;Forwards;Backwards;With walker Number of Stairs: (single step X2 trials) General stair comments: cues for sequencing and  technique; assistance to stabilize RW for safety   Wheelchair Mobility    Modified Rankin (Stroke Patients Only)       Balance Overall balance assessment: Needs assistance Sitting-balance support: No upper extremity supported;Feet supported Sitting balance-Leahy Scale: Good     Standing balance support: Bilateral upper extremity supported;During functional activity Standing balance-Leahy Scale: Poor                              Cognition Arousal/Alertness: Awake/alert Behavior During Therapy: WFL for tasks assessed/performed Overall Cognitive Status: Within Functional Limits for tasks assessed                                        Exercises      General Comments        Pertinent Vitals/Pain Pain Assessment: 0-10 Pain Score: 4  Pain Location: R knee  Pain Descriptors / Indicators: Operative site guarding;Sore Pain Intervention(s): Monitored during session;Repositioned;Ice applied    Home Living                      Prior Function            PT Goals (current goals can now be found in the care plan section) Acute Rehab PT Goals PT Goal Formulation: With patient Time For Goal Achievement: 01/02/18 Potential to Achieve Goals: Good Progress towards PT goals: Progressing toward goals    Frequency    7X/week      PT Plan Current plan remains appropriate    Co-evaluation  AM-PAC PT "6 Clicks" Daily Activity  Outcome Measure  Difficulty turning over in bed (including adjusting bedclothes, sheets and blankets)?: A Little Difficulty moving from lying on back to sitting on the side of the bed? : A Lot Difficulty sitting down on and standing up from a chair with arms (e.g., wheelchair, bedside commode, etc,.)?: Unable Help needed moving to and from a bed to chair (including a wheelchair)?: A Little Help needed walking in hospital room?: A Little Help needed climbing 3-5 steps with a railing? : A  Little 6 Click Score: 15    End of Session Equipment Utilized During Treatment: Gait belt Activity Tolerance: Patient tolerated treatment well Patient left: in chair;with call bell/phone within reach;with family/visitor present Nurse Communication: Mobility status PT Visit Diagnosis: Other abnormalities of gait and mobility (R26.89);Muscle weakness (generalized) (M62.81);Pain Pain - Right/Left: Right Pain - part of body: Knee     Time: 6734-1937 PT Time Calculation (min) (ACUTE ONLY): 37 min  Charges:  $Gait Training: 8-22 mins $Therapeutic Activity: 8-22 mins                     Earney Navy, PTA Acute Rehabilitation Services Pager: [phone] Office: [phone]     Darliss Cheney 12/20/2017, 9:34 AM

## 2017-12-20 NOTE — Plan of Care (Signed)
Problem: Education: Goal: Knowledge of General Education information will improve Description Including pain rating scale, medication(s)/side effects and non-pharmacologic comfort measures Outcome: Progressing   Problem: Clinical Measurements: Goal: Ability to maintain clinical measurements within normal limits will improve Outcome: Progressing Goal: Respiratory complications will improve Outcome: Progressing   Problem: Activity: Goal: Risk for activity intolerance will decrease Outcome: Progressing   Problem: Nutrition: Goal: Adequate nutrition will be maintained Outcome: Progressing   Problem: Coping: Goal: Level of anxiety will decrease Outcome: Progressing   Problem: Elimination: Goal: Will not experience complications related to urinary retention Outcome: Progressing   Problem: Pain Managment: Goal: General experience of comfort will improve Outcome: Progressing   Problem: Safety: Goal: Ability to remain free from injury will improve Outcome: Progressing   Problem: Skin Integrity: Goal: Risk for impaired skin integrity will decrease Outcome: Progressing   Problem: Clinical Measurements: Goal: Postoperative complications will be avoided or minimized Outcome: Progressing

## 2017-12-20 NOTE — Progress Notes (Signed)
Subjective: 1 Day Post-Op Procedure(s) (LRB): RIGHT TOTAL KNEE ARTHROPLASTY (Right) Patient reports pain as moderate.    Objective: Vital signs in last 24 hours: Temp:  [97 F (36.1 C)-98.3 F (36.8 C)] 98.2 F (36.8 C) (10/22 0332) Pulse Rate:  [45-105] 64 (10/22 0332) Resp:  [9-18] 16 (10/22 0332) BP: (103-159)/(62-147) 122/68 (10/22 0332) SpO2:  [92 %-100 %] 99 % (10/22 0332) Weight:  [86.2 kg] 86.2 kg (10/21 0825)  Intake/Output from previous day: 10/21 0701 - 10/22 0700 In: 3022.5 [P.O.:360; I.V.:2362.5; IV Piggyback:300] Out: 5053 [Urine:1550; Blood:100] Intake/Output this shift: Total I/O In: -  Out: 450 [Urine:450]  Recent Labs    12/20/17 0514  HGB 12.2*   Recent Labs    12/20/17 0514  WBC 8.4  RBC 4.22  HCT 36.6*  PLT 164   Recent Labs    12/20/17 0514  NA 134*  K 3.4*  CL 102  CO2 23  BUN 8  CREATININE 0.97  GLUCOSE 112*  CALCIUM 8.3*   No results for input(s): LABPT, INR in the last 72 hours.  Neurologically intact Neurovascular intact Sensation intact distally Intact pulses distally Dorsiflexion/Plantar flexion intact Incision: dressing C/D/I No cellulitis present Compartment soft  Anticipated LOS equal to or greater than 2 midnights due to - Age 61 and older with one or more of the following:  - Obesity  - Expected need for hospital services (PT, OT, Nursing) required for safe  discharge  - Anticipated need for postoperative skilled nursing care or inpatient rehab  - Active co-morbidities: None OR   - Unanticipated findings during/Post Surgery: Slow post-op progression: GI, pain control, mobility  - Patient is a high risk of re-admission due to: None   Assessment/Plan: 1 Day Post-Op Procedure(s) (LRB): RIGHT TOTAL KNEE ARTHROPLASTY (Right) Advance diet Up with therapy D/C IV fluids Discharge home with home health tomorrow WBAT RLE ABLA-mild and stable    Aundra Dubin 12/20/2017, 8:08 AM

## 2017-12-20 NOTE — Plan of Care (Signed)
  Problem: Activity: Goal: Risk for activity intolerance will decrease Outcome: Progressing   Problem: Pain Managment: Goal: General experience of comfort will improve Outcome: Progressing   Problem: Elimination: Goal: Will not experience complications related to urinary retention Outcome: Progressing

## 2017-12-21 MED FILL — METHOCARBAMOL 750 MG TABS: 750 | 30 days supply | Qty: 60 | Fill #0

## 2017-12-21 MED FILL — PROMETHAZINE 25 MG TABLET: 25 | 7 days supply | Qty: 30 | Fill #0

## 2017-12-21 MED FILL — ONDANSETRON HCL 4 MG TABLET: 4 | 6 days supply | Qty: 40 | Fill #0

## 2017-12-21 NOTE — Progress Notes (Signed)
Subjective: 2 Days Post-Op Procedure(s) (LRB): RIGHT TOTAL KNEE ARTHROPLASTY (Right) Patient reports pain as moderate.  Doing well otherwise this am.   Objective: Vital signs in last 24 hours: Temp:  [98 F (36.7 C)-98.2 F (36.8 C)] 98.1 F (36.7 C) (10/23 0414) Pulse Rate:  [66-71] 66 (10/23 0414) Resp:  [16-18] 18 (10/23 0414) BP: (121-130)/(70-77) 121/76 (10/23 0414) SpO2:  [98 %-100 %] 100 % (10/23 0414)  Intake/Output from previous day: 10/22 0701 - 10/23 0700 In: 950 [P.O.:840; I.V.:110] Out: 1800 [Urine:1800] Intake/Output this shift: Total I/O In: 240 [P.O.:240] Out: 300 [Urine:300]  Recent Labs    12/20/17 0514  HGB 12.2*   Recent Labs    12/20/17 0514  WBC 8.4  RBC 4.22  HCT 36.6*  PLT 164   Recent Labs    12/20/17 0514  NA 134*  K 3.4*  CL 102  CO2 23  BUN 8  CREATININE 0.97  GLUCOSE 112*  CALCIUM 8.3*   No results for input(s): LABPT, INR in the last 72 hours.  Neurologically intact Neurovascular intact Sensation intact distally Intact pulses distally Dorsiflexion/Plantar flexion intact Incision: dressing C/D/I No cellulitis present Compartment soft  Anticipated LOS equal to or greater than 2 midnights due to - Age 61 and older with one or more of the following:  - Obesity  - Expected need for hospital services (PT, OT, Nursing) required for safe  discharge  - Anticipated need for postoperative skilled nursing care or inpatient rehab  - Active co-morbidities: None OR   - Unanticipated findings during/Post Surgery: Slow post-op progression: GI, pain control, mobility  - Patient is a high risk of re-admission due to: None   Assessment/Plan: 2 Days Post-Op Procedure(s) (LRB): RIGHT TOTAL KNEE ARTHROPLASTY (Right) Advance diet Up with therapy D/C IV fluids Discharge home with home health after second PT session WBAT RLE ABLA-mild and stable PLEASE APPLY THIGH HIGH TED HOSE TO RLE     Aundra Dubin 12/21/2017, 6:59 AM

## 2017-12-21 NOTE — Discharge Summary (Signed)
Patient ID: Willie Parker MRN: [id number] DOB/AGE: 1955-08-14 62 y.o.  Admit date: 12/19/2017 Discharge date: 12/21/2017  Admission Diagnoses:  Principal Problem:   Primary osteoarthritis of right knee Active Problems:   Total knee replacement status   Discharge Diagnoses:  Same  Past Medical History:  Diagnosis Date  . Arthritis   . Knee pain     Surgeries: Procedure(s): RIGHT TOTAL KNEE ARTHROPLASTY on 12/19/2017   Consultants:   Discharged Condition: Improved  Hospital Course: Willie Parker is an 62 y.o. male who was admitted 12/19/2017 for operative treatment ofPrimary osteoarthritis of right knee. Patient has severe unremitting pain that affects sleep, daily activities, and work/hobbies. After pre-op clearance the patient was taken to the operating room on 12/19/2017 and underwent  Procedure(s): RIGHT TOTAL KNEE ARTHROPLASTY.    Patient was given perioperative antibiotics:  Anti-infectives (From admission, onward)   Start     Dose/Rate Route Frequency Ordered Stop   12/19/17 1630  ceFAZolin (ANCEF) IVPB 2g/100 mL premix     2 g 200 mL/hr over 30 Minutes Intravenous Every 6 hours 12/19/17 1508 12/20/17 0451   12/19/17 1036  vancomycin (VANCOCIN) powder  Status:  Discontinued       As needed 12/19/17 1036 12/19/17 1226   12/19/17 0805  ceFAZolin (ANCEF) 2-4 GM/100ML-% IVPB    Note to Pharmacy:  Nyoka Cowden   : cabinet override      12/19/17 0805 12/19/17 1032   12/19/17 0800  ceFAZolin (ANCEF) IVPB 2g/100 mL premix     2 g 200 mL/hr over 30 Minutes Intravenous On call to O.R. 12/19/17 0758 12/19/17 1032       Patient was given sequential compression devices, early ambulation, and chemoprophylaxis to prevent DVT.  Patient benefited maximally from hospital stay and there were no complications.    Recent vital signs:  Patient Vitals for the past 24 hrs:  BP Temp Temp src Pulse Resp SpO2  12/21/17 0414 121/76 98.1 F (36.7 C) Oral 66 18 100 %   12/20/17 2017 123/70 98.2 F (36.8 C) Oral 71 16 98 %  12/20/17 1349 130/77 98 F (36.7 C) Oral 68 16 100 %     Recent laboratory studies:  Recent Labs    12/20/17 0514  WBC 8.4  HGB 12.2*  HCT 36.6*  PLT 164  NA 134*  K 3.4*  CL 102  CO2 23  BUN 8  CREATININE 0.97  GLUCOSE 112*  CALCIUM 8.3*     Discharge Medications:   Allergies as of 12/21/2017   No Known Allergies     Medication List    STOP taking these medications   acetaminophen 500 MG tablet Commonly known as:  TYLENOL   diclofenac sodium 1 % Gel Commonly known as:  VOLTAREN   ibuprofen 200 MG tablet Commonly known as:  ADVIL,MOTRIN   Na Sulfate-K Sulfate-Mg Sulf 17.5-3.13-1.6 GM/177ML Soln     TAKE these medications   aspirin EC 81 MG tablet Take 1 tablet (81 mg total) by mouth 2 (two) times daily.   methocarbamol 750 MG tablet Commonly known as:  ROBAXIN Take 1 tablet (750 mg total) by mouth 2 (two) times daily as needed for muscle spasms.   ondansetron 4 MG tablet Commonly known as:  ZOFRAN Take 1-2 tablets (4-8 mg total) by mouth every 8 (eight) hours as needed for nausea or vomiting.   oxyCODONE 5 MG immediate release tablet Commonly known as:  Oxy IR/ROXICODONE Take 1-3 tablets (5-15 mg total) by mouth every  4 (four) hours as needed.   oxyCODONE 10 mg 12 hr tablet Commonly known as:  OXYCONTIN Take 1 tablet (10 mg total) by mouth every 12 (twelve) hours for 3 days.   promethazine 25 MG tablet Commonly known as:  PHENERGAN Take 1 tablet (25 mg total) by mouth every 6 (six) hours as needed for nausea.   senna-docusate 8.6-50 MG tablet Commonly known as:  Senokot-S Take 1 tablet by mouth at bedtime as needed.   trimethoprim-polymyxin b ophthalmic solution Commonly known as:  POLYTRIM Place 1 drop into both eyes every 4 (four) hours. What changed:  when to take this            Durable Medical Equipment  (From admission, onward)         Start     Ordered   12/19/17  1508  DME Walker rolling  Once    Question:  Patient needs a walker to treat with the following condition  Answer:  Total knee replacement status   12/19/17 1508   12/19/17 1508  DME 3 n 1  Once     12/19/17 1508   12/19/17 1508  DME Bedside commode  Once    Question:  Patient needs a bedside commode to treat with the following condition  Answer:  Total knee replacement status   12/19/17 1508          Diagnostic Studies: Dg Chest 2 View  Result Date: 12/12/2017 CLINICAL DATA:  Preop right total knee replacement.  Smoker. EXAM: CHEST - 2 VIEW COMPARISON:  None. FINDINGS: Mild peribronchial thickening. Heart and mediastinal contours are within normal limits. No focal opacities or effusions. No acute bony abnormality. IMPRESSION: Mild bronchitic changes. Electronically Signed   By: Rolm Baptise M.D.   On: 12/12/2017 16:16   Dg Knee Right Port  Result Date: 12/19/2017 CLINICAL DATA:  62 year old male post total knee replacement. Initial encounter. EXAM: PORTABLE RIGHT KNEE - 1-2 VIEW COMPARISON:  08/03/2017. FINDINGS: Post total right knee replacement which appears in satisfactory position without complication noted. Lucency medial tibial plateau may be related to procedure/prior procedure. Vascular calcifications. IMPRESSION: Post total right knee replacement. Electronically Signed   By: Genia Del M.D.   On: 12/19/2017 14:02   Xr Knee 3 View Right  Result Date: 11/23/2017 Advanced tricompartmental degenerative joint disease   Disposition: Discharge disposition: 01-Home or Self Care         Follow-up Information    Leandrew Koyanagi, MD In 2 weeks.   Specialty:  Orthopedic Surgery Why:  For suture removal, For wound re-check Contact information: [place] [id number] [phone]        [facility] Follow up.   Specialty:  [place] Why:  A representative from [facility] will contact you to arrange start date and time  for your therapy. Contact information: [address] [place] [address] [phone]            Signed: Aundra Dubin 12/21/2017, 6:52 AM

## 2017-12-21 NOTE — Progress Notes (Signed)
Physical Therapy Treatment Patient Details Name: Willie Parker MRN: [id number] DOB: 03-Oct-1955 Today's Date: 12/21/2017    History of Present Illness Pt is a 62 y/o male s/p R TKA. PMH includes arthritis.     PT Comments    Patient is making good progress with PT.  From a mobility standpoint anticipate patient will be ready for DC home when medically ready.    Follow Up Recommendations  Follow surgeon's recommendation for DC plan and follow-up therapies;Supervision for mobility/OOB     Equipment Recommendations  3in1 (PT);Rolling walker with 5" wheels    Recommendations for Other Services       Precautions / Restrictions Precautions Precautions: Knee Precaution Comments: Reviewed knee precautions and positioning   Restrictions Weight Bearing Restrictions: Yes RLE Weight Bearing: Weight bearing as tolerated    Mobility  Bed Mobility                  Transfers                    Ambulation/Gait                 Stairs             Wheelchair Mobility    Modified Rankin (Stroke Patients Only)       Balance                                            Cognition Arousal/Alertness: Awake/alert Behavior During Therapy: WFL for tasks assessed/performed Overall Cognitive Status: Within Functional Limits for tasks assessed                                        Exercises Total Joint Exercises Ankle Circles/Pumps: AROM;Both Quad Sets: AROM;Right Short Arc Quad: AROM;Right Heel Slides: AROM;Right Hip ABduction/ADduction: AROM;Right Straight Leg Raises: AROM;Right    General Comments        Pertinent Vitals/Pain Pain Assessment: Faces Faces Pain Scale: Hurts little more Pain Location: R knee  Pain Descriptors / Indicators: Operative site guarding;Sore Pain Intervention(s): Monitored during session;Repositioned    Home Living                      Prior Function            PT  Goals (current goals can now be found in the care plan section) Acute Rehab PT Goals PT Goal Formulation: With patient Time For Goal Achievement: 01/02/18 Potential to Achieve Goals: Good Progress towards PT goals: Progressing toward goals    Frequency    7X/week      PT Plan Current plan remains appropriate    Co-evaluation              AM-PAC PT "6 Clicks" Daily Activity  Outcome Measure  Difficulty turning over in bed (including adjusting bedclothes, sheets and blankets)?: A Little Difficulty moving from lying on back to sitting on the side of the bed? : A Lot Difficulty sitting down on and standing up from a chair with arms (e.g., wheelchair, bedside commode, etc,.)?: Unable Help needed moving to and from a bed to chair (including a wheelchair)?: A Little Help needed walking in hospital room?: A Little Help needed climbing 3-5 steps with a railing? : A Little  6 Click Score: 15    End of Session   Activity Tolerance: Patient tolerated treatment well Patient left: with family/visitor present;with call bell/phone within reach;in bed Nurse Communication: Mobility status PT Visit Diagnosis: Other abnormalities of gait and mobility (R26.89);Muscle weakness (generalized) (M62.81);Pain Pain - Right/Left: Right Pain - part of body: Knee     Time: 7062-3762 PT Time Calculation (min) (ACUTE ONLY): 21 min  Charges:  $Therapeutic Exercise: 8-22 mins                     Earney Navy, PTA Acute Rehabilitation Services Pager: [phone] Office: [phone]     Darliss Cheney 12/21/2017, 3:24 PM

## 2017-12-21 NOTE — Progress Notes (Signed)
Physical Therapy Treatment Patient Details Name: Willie Parker MRN: [id number] DOB: 01-08-1956 Today's Date: 12/21/2017    History of Present Illness Pt is a 62 y/o male s/p R TKA. PMH includes arthritis.     PT Comments    Patient continues to make progress toward PT goals and tolerated session well. Pt demonstrates improved gait mechanics and carry over of stair training from previous session. Continue to progress as tolerated.    Follow Up Recommendations  Follow surgeon's recommendation for DC plan and follow-up therapies;Supervision for mobility/OOB     Equipment Recommendations  3in1 (PT);Rolling walker with 5" wheels    Recommendations for Other Services       Precautions / Restrictions Precautions Precautions: Knee Precaution Comments: Reviewed knee precautions and positioning   Restrictions Weight Bearing Restrictions: Yes RLE Weight Bearing: Weight bearing as tolerated    Mobility  Bed Mobility Overal bed mobility: Modified Independent Bed Mobility: Supine to Sit           General bed mobility comments: increased time and effort; no use of rails  Transfers Overall transfer level: Needs assistance Equipment used: Rolling walker (2 wheeled) Transfers: Sit to/from Stand Sit to Stand: Supervision         General transfer comment: for safety  Ambulation/Gait Ambulation/Gait assistance: Min guard;Supervision Gait Distance (Feet): 250 Feet Assistive device: Rolling walker (2 wheeled) Gait Pattern/deviations: Step-through pattern;Decreased stance time - right;Decreased step length - left;Decreased weight shift to right Gait velocity: decreased   General Gait Details: cues for posture and safe use of AD   Stairs   Stairs assistance: Supervision Stair Management: Two rails;Step to pattern;Forwards Number of Stairs: 4 General stair comments: pt demonstrates carry over of sequencing    Wheelchair Mobility    Modified Rankin (Stroke Patients  Only)       Balance Overall balance assessment: Needs assistance Sitting-balance support: No upper extremity supported;Feet supported Sitting balance-Leahy Scale: Good     Standing balance support: Bilateral upper extremity supported;During functional activity Standing balance-Leahy Scale: Poor                              Cognition Arousal/Alertness: Awake/alert Behavior During Therapy: WFL for tasks assessed/performed Overall Cognitive Status: Within Functional Limits for tasks assessed                                        Exercises Total Joint Exercises Long Arc Quad: AROM;Right;Seated Knee Flexion: AROM;AAROM;Right;10 reps;Seated(10 second holds)    General Comments        Pertinent Vitals/Pain Pain Assessment: Faces Faces Pain Scale: Hurts little more Pain Location: R knee  Pain Descriptors / Indicators: Operative site guarding;Sore Pain Intervention(s): Monitored during session;Repositioned;Patient requesting pain meds-RN notified    Home Living                      Prior Function            PT Goals (current goals can now be found in the care plan section) Acute Rehab PT Goals PT Goal Formulation: With patient Time For Goal Achievement: 01/02/18 Potential to Achieve Goals: Good Progress towards PT goals: Progressing toward goals    Frequency    7X/week      PT Plan Current plan remains appropriate    Co-evaluation  AM-PAC PT "6 Clicks" Daily Activity  Outcome Measure  Difficulty turning over in bed (including adjusting bedclothes, sheets and blankets)?: A Little Difficulty moving from lying on back to sitting on the side of the bed? : A Lot Difficulty sitting down on and standing up from a chair with arms (e.g., wheelchair, bedside commode, etc,.)?: Unable Help needed moving to and from a bed to chair (including a wheelchair)?: A Little Help needed walking in hospital room?: A  Little Help needed climbing 3-5 steps with a railing? : A Little 6 Click Score: 15    End of Session Equipment Utilized During Treatment: Gait belt Activity Tolerance: Patient tolerated treatment well Patient left: with family/visitor present;with call bell/phone within reach;in chair Nurse Communication: Mobility status PT Visit Diagnosis: Other abnormalities of gait and mobility (R26.89);Muscle weakness (generalized) (M62.81);Pain Pain - Right/Left: Right Pain - part of body: Knee     Time: 3736-6815 PT Time Calculation (min) (ACUTE ONLY): 28 min  Charges:  $Gait Training: 8-22 mins $Therapeutic Activity: 8-22 mins                     Earney Navy, PTA Acute Rehabilitation Services Pager: [phone] Office: [phone]     Darliss Cheney 12/21/2017, 8:58 AM

## 2017-12-22 ENCOUNTER — Telehealth (INDEPENDENT_AMBULATORY_CARE_PROVIDER_SITE_OTHER): Payer: Self-pay | Admitting: Orthopaedic Surgery

## 2017-12-22 NOTE — Telephone Encounter (Signed)
Anderson Malta, PT with [facility] is requesting physical therapy verbal orders  1 time this week  3 times next week   She would also like a prescription for a single point cane faxed to [facility]. (she did not have fax #)  Jennifer's # [phone]

## 2017-12-22 NOTE — Telephone Encounter (Signed)
See message.

## 2017-12-22 NOTE — Telephone Encounter (Signed)
yes

## 2017-12-22 NOTE — Progress Notes (Signed)
Discharged yesterday s/p knee replacement.   I would recommend holding off on screening colonoscopy until recovered from knee replacement surgery.  Have patient inquire from surgeon when he can proceed with TCS.

## 2017-12-23 NOTE — Telephone Encounter (Signed)
Order faxed.

## 2017-12-23 NOTE — Telephone Encounter (Signed)
IC verbal given.  

## 2017-12-28 ENCOUNTER — Telehealth (INDEPENDENT_AMBULATORY_CARE_PROVIDER_SITE_OTHER): Payer: Self-pay

## 2017-12-28 ENCOUNTER — Other Ambulatory Visit (INDEPENDENT_AMBULATORY_CARE_PROVIDER_SITE_OTHER): Payer: Self-pay | Admitting: Family

## 2017-12-28 MED ORDER — OXYCODONE HCL 5 MG PO TABS: 5.0000 mg | ORAL_TABLET | Freq: Four times a day (QID) | ORAL | 0 refills | Status: DC | PRN

## 2017-12-28 NOTE — Telephone Encounter (Signed)
Willie Parker calls from the patient's home to let us know he is experiencing severe right knee pain, a 9 out of 10.  He is s/p TKA on 12/19/17. He is out of his pain medication: Oxy 5 mg 1-3 q 4 hrs and Oxy 10 q 12 hours.  He had none to take today. CB# for the patient  [phone].  He is aware that Dr. Erlinda Hong is not in clinic and another provider will need to be consulted on this.

## 2017-12-28 NOTE — Telephone Encounter (Signed)
Can you please advise? Thanks.

## 2017-12-28 NOTE — Telephone Encounter (Signed)
Called patient Rx ready for pick up at the front desk.

## 2017-12-30 ENCOUNTER — Telehealth (INDEPENDENT_AMBULATORY_CARE_PROVIDER_SITE_OTHER): Payer: Self-pay | Admitting: Orthopaedic Surgery

## 2017-12-30 ENCOUNTER — Telehealth: Payer: Self-pay

## 2017-12-30 NOTE — Telephone Encounter (Signed)
[facility]  [phone]    Verbal orders  3 times next week  1 time the last week

## 2017-12-30 NOTE — Telephone Encounter (Signed)
Dr.Xu, this patient is scheduled for a colonoscopy on 02/08/18. Our PA- Neil Crouch, wanted [place] to check with you to make sure you feel he will be completely recovered from his knee surgery by that time and able to have his colonoscopy. Do you think it will be ok?

## 2017-12-30 NOTE — Telephone Encounter (Signed)
Yes

## 2017-12-30 NOTE — Telephone Encounter (Signed)
Ok on orders.  

## 2017-12-30 NOTE — Progress Notes (Signed)
Message sent to Dr.Xu. 

## 2017-12-30 NOTE — Telephone Encounter (Signed)
Called patient to advise  °

## 2018-01-01 NOTE — Telephone Encounter (Signed)
I would prefer that he wait 3 months from knee replacement before having the colonoscopy if the colonoscopy is not urgent.  Thanks for letting me know.

## 2018-01-02 NOTE — Telephone Encounter (Signed)
Thank you :)

## 2018-01-02 NOTE — Progress Notes (Signed)
See phone note. Dr.Xu wants pt to wait 3 months. Procedure cancelled and pt will call back in January.

## 2018-01-02 NOTE — Telephone Encounter (Signed)
Agree. Thanks

## 2018-01-02 NOTE — Telephone Encounter (Signed)
Thank you Dr.Xu.   I have called and informed the pt. His procedure has been cancelled. The pt will call me back in January to reschedule. He did not want to reschedule today. Routing to [place] for [employer].

## 2018-01-06 ENCOUNTER — Telehealth (INDEPENDENT_AMBULATORY_CARE_PROVIDER_SITE_OTHER): Payer: Self-pay

## 2018-01-06 NOTE — Telephone Encounter (Signed)
We will provide to patient when he is here on tuesday

## 2018-01-06 NOTE — Telephone Encounter (Signed)
Willie Parker from [facility] called stated patient is going to be discharged from [facility]  Is requesting referral to out patient therapy. Please advise what you want ordered Requesting specific location for therapy at [facility] [phone]

## 2018-01-10 ENCOUNTER — Encounter (INDEPENDENT_AMBULATORY_CARE_PROVIDER_SITE_OTHER): Payer: Self-pay | Admitting: Orthopaedic Surgery

## 2018-01-10 ENCOUNTER — Inpatient Hospital Stay (INDEPENDENT_AMBULATORY_CARE_PROVIDER_SITE_OTHER): Payer: Self-pay | Admitting: Orthopaedic Surgery

## 2018-01-10 ENCOUNTER — Ambulatory Visit (INDEPENDENT_AMBULATORY_CARE_PROVIDER_SITE_OTHER): Payer: Medicaid Other | Admitting: Physician Assistant

## 2018-01-10 DIAGNOSIS — Z96651 Presence of right artificial knee joint: Secondary | ICD-10-CM

## 2018-01-10 MED ORDER — HYDROCODONE-ACETAMINOPHEN 5-325 MG PO TABS: 1.0000 | ORAL_TABLET | Freq: Three times a day (TID) | ORAL | 0 refills | Status: DC | PRN

## 2018-01-10 NOTE — Progress Notes (Signed)
   Post-Op Visit Note   Patient: Willie Parker           Date of Birth: Feb 06, 1956           MRN: [id number] Visit Date: 01/10/2018 PCP: Gildardo Pounds, NP   Assessment & Plan:  Chief Complaint:  Chief Complaint  Patient presents with  . Right Knee - Routine Post Op   Visit Diagnoses:  1. S/P total knee arthroplasty, right     Plan: Patient is a pleasant 62 year old gentleman who presents to our clinic today 3 weeks status post right total knee replacement, date of surgery 12/19/2017.  He has been doing very well.  No fevers or chills.  He does admit to moderate amount of pain but has been taking Tylenol for this as he has ran out of his pain medicine.  He has been getting home health physical therapy where he has been making great progress.  Examination of the right lower extremity reveals a well-healing surgical incision without evidence of cellulitis or infection.  Moderate swelling.  Calf is soft and nontender.  He is neurovascularly intact distally.  At this point, we will send the patient to formal physical therapy.  A prescription was given to him today for this.  I have also refilled his Norco.  He will follow-up with [place] in 3 weeks time for repeat evaluation and x-rays of the right knee.  Follow-Up Instructions: Return in about 3 weeks (around 01/31/2018).   Orders:  No orders of the defined types were placed in this encounter.  Meds ordered this encounter  Medications  . HYDROcodone-acetaminophen (NORCO) 5-325 MG tablet    Sig: Take 1-2 tablets by mouth 3 (three) times daily as needed for moderate pain.    Dispense:  30 tablet    Refill:  0    Imaging: No new imaging  PMFS History: Patient Active Problem List   Diagnosis Date Noted  . Primary osteoarthritis of right knee 12/19/2017  . S/P total knee arthroplasty, right 12/19/2017   Past Medical History:  Diagnosis Date  . Arthritis   . Knee pain     Family History  Problem Relation Age of Onset  .  Diabetes Mother   . Hypertension Mother     Past Surgical History:  Procedure Laterality Date  . MANDIBLE FRACTURE SURGERY     when he was 62 year old  . TOTAL KNEE ARTHROPLASTY Right 12/19/2017   Procedure: RIGHT TOTAL KNEE ARTHROPLASTY;  Surgeon: Leandrew Koyanagi, MD;  Location: [place];  Service: Orthopedics;  Laterality: Right;   Social History   Occupational History  . Not on file  Tobacco Use  . Smoking status: Current Every Day Smoker    Packs/day: 0.25    Types: Cigarettes  . Smokeless tobacco: Never Used  Substance and Sexual Activity  . Alcohol use: Not Currently    Comment: has not had alcohol for over a month  . Drug use: Never  . Sexual activity: Not Currently

## 2018-01-12 ENCOUNTER — Ambulatory Visit: Payer: Medicaid Other | Attending: Orthopaedic Surgery | Admitting: Physical Therapy

## 2018-01-12 ENCOUNTER — Other Ambulatory Visit: Payer: Self-pay

## 2018-01-12 DIAGNOSIS — R2689 Other abnormalities of gait and mobility: Secondary | ICD-10-CM | POA: Diagnosis present

## 2018-01-12 DIAGNOSIS — R262 Difficulty in walking, not elsewhere classified: Secondary | ICD-10-CM | POA: Insufficient documentation

## 2018-01-12 DIAGNOSIS — R600 Localized edema: Secondary | ICD-10-CM | POA: Insufficient documentation

## 2018-01-12 DIAGNOSIS — M25561 Pain in right knee: Secondary | ICD-10-CM | POA: Diagnosis present

## 2018-01-12 DIAGNOSIS — M25661 Stiffness of right knee, not elsewhere classified: Secondary | ICD-10-CM | POA: Diagnosis present

## 2018-01-12 DIAGNOSIS — M6281 Muscle weakness (generalized): Secondary | ICD-10-CM

## 2018-01-12 NOTE — Therapy (Signed)
[facility] [address]  [place], [place], [address] Phone: [phone]   Fax:  [phone]  Physical Therapy Evaluation  Patient Details  Name: Willie Parker MRN: [id number] Date of Birth: [birth year] Referring Provider (PT): Azucena Cecil, MD   Encounter Date: 01/12/2018  PT End of Session - 01/12/18 1105    Visit Number  1    Number of Visits  13    Date for PT Re-Evaluation  02/28/18    Authorization Type  Medicaid    PT Start Time  1105    PT Stop Time  1158    PT Time Calculation (min)  53 min    Activity Tolerance  Patient tolerated treatment well    Behavior During Therapy  [facility] for tasks assessed/performed       Past Medical History:  Diagnosis Date  . Arthritis   . Knee pain     Past Surgical History:  Procedure Laterality Date  . MANDIBLE FRACTURE SURGERY     when he was 62 year old  . TOTAL KNEE ARTHROPLASTY Right 12/19/2017   Procedure: RIGHT TOTAL KNEE ARTHROPLASTY;  Surgeon: Leandrew Koyanagi, MD;  Location: [place];  Service: Orthopedics;  Laterality: Right;    There were no vitals filed for this visit.   Subjective Assessment - 01/12/18 1107    Subjective  Pt s/p R TKR on 12/19/17 with 2 night acute care stay followed by [facility] PT x 6 sessions which was completed 1 week ago. States knee remains very stiff and still very painful.    Pertinent History  R TKR 12/19/17    Limitations  Sitting;Standing;Walking;House hold activities    How long can you sit comfortably?  5 minutes    How long can you stand comfortably?  10 minutes    How long can you walk comfortably?  <5 minutes    Patient Stated Goals  "knee back to 90%"    Currently in Pain?  Yes    Pain Score  6     Pain Location  Knee    Pain Orientation  Right;Anterior;Medial;Lateral    Pain Descriptors / Indicators  Throbbing;Burning;Aching    Pain Type  Acute pain    Pain Radiating Towards  down back of R leg to toes    Pain Onset  1  to 4 weeks ago    Pain Frequency  Constant    Aggravating Factors   sitting for long periods, laying down    Pain Relieving Factors  stretching leg out, flexing/bending knee, walking, ice pack    Effect of Pain on Daily Activities  limited standing tolerance - difficulty cooking; uncomfortable sitting watching TV         [facility] PT Assessment - 01/12/18 1105      Assessment   Medical Diagnosis  R TKR    Referring Provider (PT)  N. Eduard Roux, MD    Onset Date/Surgical Date  12/19/17    Next MD Visit  01/31/18    Prior Therapy  HH PT x 6 visits      Balance Screen   Has the patient fallen in the past 6 months  No    Has the patient had a decrease in activity level because of a fear of falling?   Yes    Is the patient reluctant to leave their home because of a fear of falling?   No      Home Environment  Living Environment  Private residence    Living Arrangements  Spouse/significant other    Type of [place] Access  Level entry    [place] - single point;Walker - 2 wheels;Bedside commode;Shower seat      Prior Function   Level of Independence  Independent    Vocation  On disability    Leisure  fishing      Cognition   Overall Cognitive Status  Within Functional Limits for tasks assessed      Sensation   Light Touch  Appears Intact      ROM / Strength   AROM / PROM / Strength  AROM;PROM;Strength      AROM   AROM Assessment Site  Knee    Right/Left Knee  Right;Left    Right Knee Extension  8   14 dg with LAQ   Right Knee Flexion  88    Left Knee Extension  0    Left Knee Flexion  130      PROM   PROM Assessment Site  Knee    Right/Left Knee  Right    Right Knee Extension  6    Right Knee Flexion  96      Strength   Strength Assessment Site  Hip;Knee    Right/Left Hip  Right;Left    Right Hip Flexion  3+/5    Right Hip Extension  3+/5    Right Hip ABduction  4/5    Right Hip ADduction  4-/5    Left Hip  Flexion  4+/5    Left Hip Extension  4-/5    Left Hip ABduction  4+/5    Left Hip ADduction  4/5    Right/Left Knee  Right;Left    Right Knee Flexion  4-/5    Right Knee Extension  4-/5    Left Knee Flexion  5/5    Left Knee Extension  5/5      Flexibility   Soft Tissue Assessment /Muscle Length  yes    Hamstrings  mild tight B    Quadriceps  mod tight B    ITB  mild tight R      Palpation   Patella mobility  R knee decreased in all directions      Ambulation/Gait   Ambulation Distance (Feet)  80 Feet    Gait Pattern  Step-through pattern;Antalgic;Decreased weight shift to right;Decreased stance time - right;Decreased hip/knee flexion - right;Lateral trunk lean to left    Ambulation Surface  Level;Indoor    Gait Comments  Cane height adjusted 1 notch taller which improved posture and decreased lateral lean during gait.                Objective measurements completed on examination: See above findings.      [place] Adult PT Treatment/Exercise - 01/12/18 1105      Exercises   Exercises  Knee/Hip      Knee/Hip Exercises: Stretches   Passive Hamstring Stretch  Right;30 seconds;2 reps    Passive Hamstring Stretch Limitations  supine with strap    Quad Stretch  Right;30 seconds;2 reps    Sports administrator Limitations  prone with strap    Gastroc Stretch  Right;30 seconds;1 rep    Gastroc Stretch Limitations  standing at wall    Other Knee/Hip Stretches  Verbal instruction in knee extension prop      Knee/Hip Exercises: Seated  Heel Slides Limitations  Verbal instruction in AAROM heel slides.      Knee/Hip Exercises: Supine   Heel Slides  Right;AAROM;5 reps    Heel Slides Limitations  5" hold with strap    Patellar Mobs  Instruction provided in R patellar mobs for all directions             PT Education - 01/12/18 1150    Education Details  PT eval findings, anticipated POC & initial HEP    Person(s) Educated  Patient    Methods   Explanation;Demonstration;Handout    Comprehension  Verbalized understanding;Returned demonstration;Need further instruction       PT Short Term Goals - 01/12/18 1158      PT SHORT TERM GOAL #1   Title  Independent with initial HEP    Status  New    Target Date  01/26/18        PT Long Term Goals - 01/12/18 1158      PT LONG TERM GOAL #1   Title  Independent iwth advance/ongoing HEP    Status  New    Target Date  02/28/18      PT LONG TERM GOAL #2   Title  R knee AROM >/= 3-120 degrees to allow for normal gait and mobility    Status  New    Target Date  02/28/18      PT LONG TERM GOAL #3   Title  R hip and knee strength >/= 4+/5 for improved stability    Status  New    Target Date  02/28/18      PT LONG TERM GOAL #4   Title  Pt will ambulate w/o AD with normal gait pattern w/o evidence of R LE instability    Status  New    Target Date  02/28/18      PT LONG TERM GOAL #5   Title  Pt will ascend/descend 1 flight of stairs reciprocally with good step pattern to allow for increased ease of community access    Status  New    Target Date  02/28/18             Plan - 01/12/18 1158    Clinical Impression Statement  Willie Parker is a 62 y/o male presenting to OP PT today for low complexity initial evaluation 24 days s/p elective R TKR on 12/19/17. He has completed 6 HH PT visits since discharge from hospital. He arrives to PT ambulating with [facility] demonstrating antalgic gait pattern favoring R LE with decreased hip and knee flexion and decreased weight shift to R. Assessment reveals decreased AROM & PROM of R knee, mild to moderate decreased proximal LE flexibility with decreased R patellar mobility in all planes, and mild to moderate R hip and knee weakness. Pt will benefit from skilled PT intervention to address the above listed deficits to allow for improved gait to maximize function and mobility.    Clinical Presentation  Stable    Clinical Decision Making  Low    Rehab  Potential  Good    PT Frequency  2x / week    PT Duration  6 weeks    PT Treatment/Interventions  Patient/family education;Neuromuscular re-education;Therapeutic exercise;Therapeutic activities;Functional mobility training;Gait training;Stair training;ADLs/Self Care Home Management;Electrical Stimulation;Cryotherapy;Vasopneumatic Device;Manual techniques;Passive range of motion;Dry needling;Taping;Balance training    Consulted and Agree with Plan of Care  Patient       Patient will benefit from skilled therapeutic intervention in order to improve the following deficits and  impairments:  Pain, Impaired flexibility, Hypomobility, Decreased range of motion, Decreased strength, Decreased scar mobility, Increased muscle spasms, Increased fascial restricitons, Decreased mobility, Difficulty walking, Abnormal gait, Decreased activity tolerance, Decreased balance, Postural dysfunction  Visit Diagnosis: Acute pain of right knee  Stiffness of right knee, not elsewhere classified  Muscle weakness (generalized)  Other abnormalities of gait and mobility  Difficulty in walking, not elsewhere classified  Localized edema     Problem List Patient Active Problem List   Diagnosis Date Noted  . Primary osteoarthritis of right knee 12/19/2017  . S/P total knee arthroplasty, right 12/19/2017    Percival Spanish, PT, MPT 01/12/2018, 1:24 PM  [facility] [address]  [place], [place], [address] Phone: [phone]   Fax:  [phone]  Name: Willie Parker MRN: [id number] Date of Birth: 12-17-1955

## 2018-01-18 ENCOUNTER — Ambulatory Visit: Payer: Medicaid Other

## 2018-01-18 DIAGNOSIS — M25561 Pain in right knee: Secondary | ICD-10-CM | POA: Diagnosis not present

## 2018-01-18 DIAGNOSIS — R2689 Other abnormalities of gait and mobility: Secondary | ICD-10-CM

## 2018-01-18 DIAGNOSIS — M25661 Stiffness of right knee, not elsewhere classified: Secondary | ICD-10-CM

## 2018-01-18 DIAGNOSIS — R600 Localized edema: Secondary | ICD-10-CM

## 2018-01-18 DIAGNOSIS — R262 Difficulty in walking, not elsewhere classified: Secondary | ICD-10-CM

## 2018-01-18 DIAGNOSIS — M6281 Muscle weakness (generalized): Secondary | ICD-10-CM

## 2018-01-18 NOTE — Therapy (Signed)
[facility] [address]  [place], [place], [address] Phone: [phone]   Fax:  [phone]  Physical Therapy Treatment  Patient Details  Name: Willie Parker MRN: [id number] Date of Birth: June 09, 1955 Referring Provider (PT): Azucena Cecil, MD   Encounter Date: 01/18/2018  PT End of Session - 01/18/18 1410    Visit Number  2    Number of Visits  13    Date for PT Re-Evaluation  02/28/18    Authorization Type  Medicaid    Authorization Time Period  11.20.19 - 12.31.19    Authorization - Visit Number  1    Authorization - Number of Visits  12    PT Start Time  3875    PT Stop Time  1500    PT Time Calculation (min)  55 min    Activity Tolerance  Patient tolerated treatment well    Behavior During Therapy  [facility] for tasks assessed/performed       Past Medical History:  Diagnosis Date  . Arthritis   . Knee pain     Past Surgical History:  Procedure Laterality Date  . MANDIBLE FRACTURE SURGERY     when he was 62 year old  . TOTAL KNEE ARTHROPLASTY Right 12/19/2017   Procedure: RIGHT TOTAL KNEE ARTHROPLASTY;  Surgeon: Leandrew Koyanagi, MD;  Location: [facility];  Service: Orthopedics;  Laterality: Right;    There were no vitals filed for this visit.  Subjective Assessment - 01/18/18 1408    Subjective  Pt. reporting difficulty finding comfortable sleeping position in bed.      Pertinent History  R TKR 12/19/17    Patient Stated Goals  "knee back to 90%"    Currently in Pain?  Yes    Pain Score  4     Pain Location  Knee    Pain Orientation  Right;Anterior;Medial;Lateral    Pain Descriptors / Indicators  Throbbing;Burning;Aching;Sore    Pain Type  Acute pain    Pain Frequency  Constant    Aggravating Factors   end range flexion    Multiple Pain Sites  No                       [facility] Adult PT Treatment/Exercise - 01/18/18 1419      Ambulation/Gait   Ambulation Distance (Feet)  90 Feet    Assistive device  Straight cane    Gait Pattern  Step-through pattern;Antalgic;Decreased weight shift to right;Decreased stance time - right;Decreased hip/knee flexion - right;Lateral trunk lean to left;Right circumduction    Ambulation Surface  Level;Indoor    Gait Comments  Pt. with improved R hip/knee flexion after cueing and to avoid circumduction; pt. still with decreased R stance time       Knee/Hip Exercises: Stretches   Passive Hamstring Stretch  Right;30 seconds;2 reps    Passive Hamstring Stretch Limitations  supine with strap    Quad Stretch  Right;30 seconds;2 reps    Quad Stretch Limitations  prone with strap    Knee: Self-Stretch to increase Flexion  --   5" x 10 reps; seated with L LE assistance   Gastroc Stretch  Right;30 seconds;2 reps    Gastroc Stretch Limitations  standing at wall      Knee/Hip Exercises: Aerobic   Nustep  Lvl 3, 7 min       Knee/Hip Exercises: Standing   Heel Raises  Both;10 reps  Functional Squat  10 reps;3 seconds    Functional Squat Limitations  TRX - mirror feedback utilized to encouraged even wt. distribution with improved wt. distribution following       Knee/Hip Exercises: Seated   Sit to Sand  10 reps;without UE support      Knee/Hip Exercises: Supine   Heel Slides  Right;AAROM;10 reps    Heel Slides Limitations  5" hold with strap      Vasopneumatic   Number Minutes Vasopneumatic   15 minutes    Vasopnuematic Location   Knee    Vasopneumatic Pressure  Medium    Vasopneumatic Temperature   coldest temp.      Manual Therapy   Manual Therapy  Joint mobilization    Manual therapy comments  supine    Joint Mobilization  R patellar mobs all directions for improved ROM              PT Education - 01/18/18 1507    Education Details  HEP update     Person(s) Educated  Patient    Methods  Explanation;Demonstration;Verbal cues;Handout    Comprehension  Verbalized understanding;Returned demonstration;Verbal cues required;Need  further instruction       PT Short Term Goals - 01/18/18 1411      PT SHORT TERM GOAL #1   Title  Independent with initial HEP    Status  On-going        PT Long Term Goals - 01/18/18 1411      PT LONG TERM GOAL #1   Title  Independent iwth advance/ongoing HEP    Status  On-going      PT LONG TERM GOAL #2   Title  R knee AROM >/= 3-120 degrees to allow for normal gait and mobility    Status  On-going      PT LONG TERM GOAL #3   Title  R hip and knee strength >/= 4+/5 for improved stability    Status  On-going      PT LONG TERM GOAL #4   Title  Pt will ambulate w/o AD with normal gait pattern w/o evidence of R LE instability    Status  On-going      PT LONG TERM GOAL #5   Title  Pt will ascend/descend 1 flight of stairs reciprocally with good step pattern to allow for increased ease of community access    Status  On-going            Plan - 01/18/18 1423    Clinical Impression Statement  Willie Parker seen to start session with slight circumducting gait pattern with decreased R hip/knee flexion which improved following cueing with SPC.  Initiated heel raise, TRX squat which were tolerated well.  Reviewed all HEP stretches as pt. reporting he has only been performing, "the exercise that you raise the leg", since last visit.  Pt. strongly encouraged to perform full HEP for full benefit from therapy.  Ended visit with ice/compression to R knee to reduce post-exercise soreness and swelling.      PT Treatment/Interventions  Patient/family education;Neuromuscular re-education;Therapeutic exercise;Therapeutic activities;Functional mobility training;Gait training;Stair training;ADLs/Self Care Home Management;Electrical Stimulation;Cryotherapy;Vasopneumatic Device;Manual techniques;Passive range of motion;Dry needling;Taping;Balance training    PT Next Visit Plan  Monitor HEP adherence; progress standing therex per pt. tolerance, knee flexion/extension ROM activities; vaso    Consulted  and Agree with Plan of Care  Patient       Patient will benefit from skilled therapeutic intervention in order to improve the following  deficits and impairments:  Pain, Impaired flexibility, Hypomobility, Decreased range of motion, Decreased strength, Decreased scar mobility, Increased muscle spasms, Increased fascial restricitons, Decreased mobility, Difficulty walking, Abnormal gait, Decreased activity tolerance, Decreased balance, Postural dysfunction  Visit Diagnosis: Acute pain of right knee  Stiffness of right knee, not elsewhere classified  Muscle weakness (generalized)  Other abnormalities of gait and mobility  Difficulty in walking, not elsewhere classified  Localized edema     Problem List Patient Active Problem List   Diagnosis Date Noted  . Primary osteoarthritis of right knee 12/19/2017  . S/P total knee arthroplasty, right 12/19/2017    Bess Harvest, PTA 01/18/18 3:15 PM   [facility] [address]  [place], [place], [address] Phone: [phone]   Fax:  [phone]  Name: Willie Parker MRN: [id number] Date of Birth: 01-06-1956

## 2018-01-24 ENCOUNTER — Encounter: Payer: Self-pay | Admitting: Physical Therapy

## 2018-01-24 ENCOUNTER — Ambulatory Visit: Payer: Medicaid Other | Admitting: Physical Therapy

## 2018-01-24 DIAGNOSIS — M25661 Stiffness of right knee, not elsewhere classified: Secondary | ICD-10-CM

## 2018-01-24 DIAGNOSIS — R262 Difficulty in walking, not elsewhere classified: Secondary | ICD-10-CM

## 2018-01-24 DIAGNOSIS — M25561 Pain in right knee: Secondary | ICD-10-CM

## 2018-01-24 DIAGNOSIS — R2689 Other abnormalities of gait and mobility: Secondary | ICD-10-CM

## 2018-01-24 DIAGNOSIS — M6281 Muscle weakness (generalized): Secondary | ICD-10-CM

## 2018-01-24 DIAGNOSIS — R600 Localized edema: Secondary | ICD-10-CM

## 2018-01-24 NOTE — Therapy (Signed)
[facility] [address]  [place], [place], [address] Phone: [phone]   Fax:  [phone]  Physical Therapy Treatment  Patient Details  Name: Willie Parker MRN: [id number] Date of Birth: March 01, 1956 Referring Provider (PT): Azucena Cecil, MD   Encounter Date: 01/24/2018  PT End of Session - 01/24/18 1104    Visit Number  3    Number of Visits  13    Date for PT Re-Evaluation  02/28/18    Authorization Type  Medicaid    Authorization Time Period  01/18/18 - 02/28/18    Authorization - Visit Number  2    Authorization - Number of Visits  12    PT Start Time  1104    PT Stop Time  1200    PT Time Calculation (min)  56 min    Activity Tolerance  Patient tolerated treatment well    Behavior During Therapy  [facility] for tasks assessed/performed       Past Medical History:  Diagnosis Date  . Arthritis   . Knee pain     Past Surgical History:  Procedure Laterality Date  . MANDIBLE FRACTURE SURGERY     when he was 62 year old  . TOTAL KNEE ARTHROPLASTY Right 12/19/2017   Procedure: RIGHT TOTAL KNEE ARTHROPLASTY;  Surgeon: Leandrew Koyanagi, MD;  Location: [facility];  Service: Orthopedics;  Laterality: Right;    There were no vitals filed for this visit.  Subjective Assessment - 01/24/18 1108    Subjective  Pt noting increased stiffness & pain this morning w/o known trigger. Pt reports he has not been doing any of the stretches requiring a strap as he had not been able to locate one.    Pertinent History  R TKR 12/19/17    Patient Stated Goals  "knee back to 90%"    Currently in Pain?  Yes    Pain Score  8     Pain Location  Knee    Pain Orientation  Right    Pain Descriptors / Indicators  Aching;Tightness   "stiffness"   Pain Type  Acute pain;Surgical pain    Pain Frequency  Constant   varies in intensity                      [facility] Adult PT Treatment/Exercise - 01/24/18 1104      Ambulation/Gait   Ambulation/Gait  Yes    Ambulation/Gait Assistance  5: Supervision    Ambulation Distance (Feet)  90 Feet    Assistive device  None    Gait Pattern  Step-through pattern;Decreased step length - left   Decreased R heel strike    Ambulation Surface  Level;Indoor    Gait Comments  Provided cues for improved heel strike B and even stance time with pt. demonstrating good carryover following cueing for a few minutes in session then reverting back to previous pattern      Exercises   Exercises  Knee/Hip      Knee/Hip Exercises: Stretches   Passive Hamstring Stretch  Right;30 seconds;1 rep    Passive Hamstring Stretch Limitations  supine with strap    Quad Stretch  Right;30 seconds;1 rep    Quad Stretch Limitations  prone with strap    Gastroc Stretch  Right;30 seconds;2 reps    Gastroc Stretch Limitations  at UBE       Knee/Hip Exercises: Aerobic   Recumbent Bike  L1 x 6  min      Knee/Hip Exercises: Machines for Strengthening   Total Gym Leg Press  B LE's 20# x 15 reps       Knee/Hip Exercises: Standing   Heel Raises  Both;15 reps    Knee Flexion  Right;10 reps    Knee Flexion Limitations  2#    Forward Step Up  Right;10 reps;Step Height: 6";Hand Hold: 1    Functional Squat  15 reps;3 seconds    Functional Squat Limitations  TRX - cues required for even wt. distribution       Knee/Hip Exercises: Seated   Hamstring Curl  Right;15 reps;Strengthening    Hamstring Limitations  green TB     Sit to Sand  10 reps;without UE support   Mirror feedback utilized to improved wt. distribution      Knee/Hip Exercises: Supine   Heel Slides  Right;AAROM;10 reps    Heel Slides Limitations  5" hold with strap      Vasopneumatic   Number Minutes Vasopneumatic   10 minutes    Vasopnuematic Location   Knee    Vasopneumatic Pressure  Medium    Vasopneumatic Temperature   coldest temp.             PT Education - 01/24/18 1201    Education Details  HEP update      Person(s) Educated  Patient    Methods  Explanation;Demonstration;Verbal cues;Handout    Comprehension  Verbalized understanding;Returned demonstration;Verbal cues required;Need further instruction       PT Short Term Goals - 01/18/18 1411      PT SHORT TERM GOAL #1   Title  Independent with initial HEP    Status  On-going        PT Long Term Goals - 01/18/18 1411      PT LONG TERM GOAL #1   Title  Independent iwth advance/ongoing HEP    Status  On-going      PT LONG TERM GOAL #2   Title  R knee AROM >/= 3-120 degrees to allow for normal gait and mobility    Status  On-going      PT LONG TERM GOAL #3   Title  R hip and knee strength >/= 4+/5 for improved stability    Status  On-going      PT LONG TERM GOAL #4   Title  Pt will ambulate w/o AD with normal gait pattern w/o evidence of R LE instability    Status  On-going      PT LONG TERM GOAL #5   Title  Pt will ascend/descend 1 flight of stairs reciprocally with good step pattern to allow for increased ease of community access    Status  On-going            Plan - 01/24/18 1111    Clinical Impression Statement  Willie Parker reporting selective compliance with HEP - not completing any of the stretches requiring a strap as he has not been able to locate a strap - suggested use of folded sheet/towel or strap from a bathrobe with pt. reporting that he could use the latter at home.  Pt. tolerated addition of forward, lateral 6" step ups, advancement of TRX squat, and addition of leg press today without complaint of significant pain increase.  Required consistent cueing with sit<>stand transition today for even wt. distribution over R/L LE.  Ended visit with ice/compression to R knee to reduce post-exercise soreness and swelling.  Pt. making good progress toward  LTG #4 and progressing well toward other established goals.      Rehab Potential  Good    PT Treatment/Interventions  Patient/family education;Neuromuscular  re-education;Therapeutic exercise;Therapeutic activities;Functional mobility training;Gait training;Stair training;ADLs/Self Care Home Management;Electrical Stimulation;Cryotherapy;Vasopneumatic Device;Manual techniques;Passive range of motion;Dry needling;Taping;Balance training    PT Next Visit Plan  Monitor HEP adherence; progress standing therex per pt. tolerance, knee flexion/extension ROM activities; vaso    Consulted and Agree with Plan of Care  Patient       Patient will benefit from skilled therapeutic intervention in order to improve the following deficits and impairments:  Pain, Impaired flexibility, Hypomobility, Decreased range of motion, Decreased strength, Decreased scar mobility, Increased muscle spasms, Increased fascial restricitons, Decreased mobility, Difficulty walking, Abnormal gait, Decreased activity tolerance, Decreased balance, Postural dysfunction  Visit Diagnosis: Acute pain of right knee  Stiffness of right knee, not elsewhere classified  Muscle weakness (generalized)  Other abnormalities of gait and mobility  Difficulty in walking, not elsewhere classified  Localized edema     Problem List Patient Active Problem List   Diagnosis Date Noted  . Primary osteoarthritis of right knee 12/19/2017  . S/P total knee arthroplasty, right 12/19/2017    Bess Harvest, PTA 01/24/18 2:35 PM    [facility] [address]  [place], [place], [address] Phone: [phone]   Fax:  [phone]  Name: Willie Parker MRN: [id number] Date of Birth: 04-01-1955

## 2018-01-25 ENCOUNTER — Ambulatory Visit: Payer: Medicaid Other | Admitting: Physical Therapy

## 2018-01-25 DIAGNOSIS — R600 Localized edema: Secondary | ICD-10-CM

## 2018-01-25 DIAGNOSIS — M25561 Pain in right knee: Secondary | ICD-10-CM

## 2018-01-25 DIAGNOSIS — R2689 Other abnormalities of gait and mobility: Secondary | ICD-10-CM

## 2018-01-25 DIAGNOSIS — M25661 Stiffness of right knee, not elsewhere classified: Secondary | ICD-10-CM

## 2018-01-25 DIAGNOSIS — R262 Difficulty in walking, not elsewhere classified: Secondary | ICD-10-CM

## 2018-01-25 DIAGNOSIS — M6281 Muscle weakness (generalized): Secondary | ICD-10-CM

## 2018-01-25 NOTE — Therapy (Signed)
[facility] [address]  [place], [place], [address] Phone: [phone]   Fax:  [phone]  Physical Therapy Treatment  Patient Details  Name: Willie Parker MRN: [id number] Date of Birth: January 24, 1956 Referring Provider (PT): Azucena Cecil, MD   Encounter Date: 01/25/2018  PT End of Session - 01/25/18 1008    Visit Number  4    Number of Visits  13    Date for PT Re-Evaluation  02/28/18    Authorization Type  Medicaid    Authorization Time Period  01/18/18 - 02/28/18    Authorization - Visit Number  3    Authorization - Number of Visits  12    PT Start Time  0930    PT Stop Time  1008   pt needed to leave early today   PT Time Calculation (min)  38 min    Activity Tolerance  Patient tolerated treatment well    Behavior During Therapy  [facility] for tasks assessed/performed       Past Medical History:  Diagnosis Date  . Arthritis   . Knee pain     Past Surgical History:  Procedure Laterality Date  . MANDIBLE FRACTURE SURGERY     when he was 62 year old  . TOTAL KNEE ARTHROPLASTY Right 12/19/2017   Procedure: RIGHT TOTAL KNEE ARTHROPLASTY;  Surgeon: Leandrew Koyanagi, MD;  Location: [facility];  Service: Orthopedics;  Laterality: Right;    There were no vitals filed for this visit.  Subjective Assessment - 01/25/18 0956    Subjective  Pt relays continued pain and stiffness but his knee is coming along    Currently in Pain?  Yes    Pain Score  6     Pain Location  Knee    Pain Orientation  Right    Pain Descriptors / Indicators  Aching;Tightness                       [facility] Adult PT Treatment/Exercise - 01/25/18 0001      Ambulation/Gait   Ambulation/Gait  Yes    Ambulation/Gait Assistance  5: Supervision    Ambulation Distance (Feet)  100 Feet    Assistive device  None    Gait Pattern  Step-through pattern;Decreased step length - left    Ambulation Surface  Level;Indoor      Exercises    Exercises  Knee/Hip      Knee/Hip Exercises: Stretches   Passive Hamstring Stretch  Right;3 reps;30 seconds    Passive Hamstring Stretch Limitations  supine with strap    Quad Stretch  Right;3 reps;30 seconds    Quad Stretch Limitations  prone with strap      Knee/Hip Exercises: Aerobic   Recumbent Bike  L2X6 min      Knee/Hip Exercises: Standing   Heel Raises  Both;15 reps    Knee Flexion  Right;2 sets;10 reps    Knee Flexion Limitations  2#    Hip Flexion  Stengthening;Right;2 sets;10 reps;Knee bent    Hip Flexion Limitations  2#    Hip Abduction  Stengthening;Both;2 sets;10 reps;Knee straight    Abduction Limitations  2#    Hip Extension  Stengthening;Right;2 sets;10 reps;Knee straight    Extension Limitations  2#    Lateral Step Up  15 reps;Right;Step Height: 6";Hand Hold: 0    Forward Step Up  Right;15 reps;Step Height: 6";Hand Hold: 0    Functional Squat  15 reps;3  seconds    Functional Squat Limitations  TRX - cues required for even wt. distribution       Knee/Hip Exercises: Seated   Long Arc Quad  Right;2 sets;10 reps    Long Arc Quad Weight  2 lbs.      Knee/Hip Exercises: Supine   Heel Slides  Right;AAROM;15 reps    Heel Slides Limitations  5" hold with strap      Modalities   Modalities  --   pt declined as he needs to leave, he was told to ice at home              PT Short Term Goals - 01/18/18 1411      PT SHORT TERM GOAL #1   Title  Independent with initial HEP    Status  On-going        PT Long Term Goals - 01/18/18 1411      PT LONG TERM GOAL #1   Title  Independent iwth advance/ongoing HEP    Status  On-going      PT LONG TERM GOAL #2   Title  R knee AROM >/= 3-120 degrees to allow for normal gait and mobility    Status  On-going      PT LONG TERM GOAL #3   Title  R hip and knee strength >/= 4+/5 for improved stability    Status  On-going      PT LONG TERM GOAL #4   Title  Pt will ambulate w/o AD with normal gait pattern w/o  evidence of R LE instability    Status  On-going      PT LONG TERM GOAL #5   Title  Pt will ascend/descend 1 flight of stairs reciprocally with good step pattern to allow for increased ease of community access    Status  On-going            Plan - 01/25/18 1009    Clinical Impression Statement  Pt had some soreness and stiffness in his knee today but had good tolerance to therex for strengthening and stretching for his Rt LE. He was encouraged to get ankle weights for home to further increase his strengthening program for home. Pt shows good effort and motivation with PT, PT will continue POC.    Rehab Potential  Good    PT Frequency  2x / week    PT Duration  6 weeks    PT Treatment/Interventions  Patient/family education;Neuromuscular re-education;Therapeutic exercise;Therapeutic activities;Functional mobility training;Gait training;Stair training;ADLs/Self Care Home Management;Electrical Stimulation;Cryotherapy;Vasopneumatic Device;Manual techniques;Passive range of motion;Dry needling;Taping;Balance training    PT Next Visit Plan  Monitor HEP adherence; progress standing therex per pt. tolerance, knee flexion/extension ROM activities; vaso    Consulted and Agree with Plan of Care  Patient       Patient will benefit from skilled therapeutic intervention in order to improve the following deficits and impairments:  Pain, Impaired flexibility, Hypomobility, Decreased range of motion, Decreased strength, Decreased scar mobility, Increased muscle spasms, Increased fascial restricitons, Decreased mobility, Difficulty walking, Abnormal gait, Decreased activity tolerance, Decreased balance, Postural dysfunction  Visit Diagnosis: Acute pain of right knee  Stiffness of right knee, not elsewhere classified  Muscle weakness (generalized)  Other abnormalities of gait and mobility  Difficulty in walking, not elsewhere classified  Localized edema     Problem List Patient Active  Problem List   Diagnosis Date Noted  . Primary osteoarthritis of right knee 12/19/2017  . S/P total knee arthroplasty, right  12/19/2017    Debbe Odea, PT,DPT 01/25/2018, 10:11 AM  [facility] [address]  [place], [place], [address] Phone: [phone]   Fax:  [phone]  Name: Abrham Maslowski MRN: [id number] Date of Birth: Jan 28, 1956

## 2018-01-30 ENCOUNTER — Ambulatory Visit: Payer: Medicaid Other | Attending: Orthopaedic Surgery | Admitting: Physical Therapy

## 2018-01-30 ENCOUNTER — Encounter: Payer: Self-pay | Admitting: Physical Therapy

## 2018-01-30 DIAGNOSIS — R2689 Other abnormalities of gait and mobility: Secondary | ICD-10-CM | POA: Diagnosis present

## 2018-01-30 DIAGNOSIS — M6281 Muscle weakness (generalized): Secondary | ICD-10-CM | POA: Insufficient documentation

## 2018-01-30 DIAGNOSIS — R600 Localized edema: Secondary | ICD-10-CM | POA: Diagnosis present

## 2018-01-30 DIAGNOSIS — M25561 Pain in right knee: Secondary | ICD-10-CM | POA: Diagnosis not present

## 2018-01-30 DIAGNOSIS — M25661 Stiffness of right knee, not elsewhere classified: Secondary | ICD-10-CM | POA: Diagnosis present

## 2018-01-30 DIAGNOSIS — R262 Difficulty in walking, not elsewhere classified: Secondary | ICD-10-CM | POA: Diagnosis present

## 2018-01-30 NOTE — Therapy (Signed)
[facility] [address]  [place], [place], [address] Phone: [phone]   Fax:  [phone]  Physical Therapy Treatment  Patient Details  Name: Willie Parker MRN: [id number] Date of Birth: 1956/01/31 Referring Provider (PT): Azucena Cecil, MD   Encounter Date: 01/30/2018  PT End of Session - 01/30/18 1100    Visit Number  5    Number of Visits  13    Date for PT Re-Evaluation  02/28/18    Authorization Type  Medicaid    Authorization Time Period  01/18/18 - 02/28/18    Authorization - Visit Number  4    Authorization - Number of Visits  12    PT Start Time  1100    PT Stop Time  1155    PT Time Calculation (min)  55 min    Activity Tolerance  Patient tolerated treatment well    Behavior During Therapy  [facility] for tasks assessed/performed       Past Medical History:  Diagnosis Date  . Arthritis   . Knee pain     Past Surgical History:  Procedure Laterality Date  . MANDIBLE FRACTURE SURGERY     when he was 62 year old  . TOTAL KNEE ARTHROPLASTY Right 12/19/2017   Procedure: RIGHT TOTAL KNEE ARTHROPLASTY;  Surgeon: Leandrew Koyanagi, MD;  Location: [place];  Service: Orthopedics;  Laterality: Right;    There were no vitals filed for this visit.  Subjective Assessment - 01/30/18 1101    Subjective  Pt walking with "stiff knee" on arrival to PT today - states knee is hurting causing him to walk this way. Reports completing "4-5 (HEP) exercises 4-5x/day".    Pertinent History  R TKR 12/19/17    Patient Stated Goals  "knee back to 90%"    Currently in Pain?  Yes    Pain Score  3     Pain Location  Knee    Pain Orientation  Right    Pain Descriptors / Indicators  Tightness;Sore   "stiffness"   Pain Type  Surgical pain;Acute pain         [facility] PT Assessment - 01/30/18 1100      Assessment   Medical Diagnosis  R TKR    Referring Provider (PT)  N. Eduard Roux, MD    Onset Date/Surgical Date  12/19/17    Next MD Visit  01/31/18      AROM   Right Knee Extension  4   7 with LAQ   Right Knee Flexion  108      PROM   Right Knee Extension  2    Right Knee Flexion  113      Strength   Right Hip Flexion  4-/5    Right Hip Extension  3+/5    Right Hip ABduction  4/5    Right Hip ADduction  4/5    Left Hip Flexion  4+/5    Left Hip Extension  4-/5    Left Hip ABduction  4+/5    Left Hip ADduction  4+/5    Right Knee Flexion  4/5    Right Knee Extension  4/5    Left Knee Flexion  5/5    Left Knee Extension  5/5                   [facility] Adult PT Treatment/Exercise - 01/30/18 1100      Ambulation/Gait   Ambulation/Gait Assistance  5: Supervision    Ambulation/Gait Assistance Details  Cues for normal gait pattern (increased hip & knee flexion with heel strike on wt acceptance) to reduce tendency for "stiff-legged" gait.    Ambulation Distance (Feet)  180 Feet    Assistive device  None    Gait Pattern  Step-through pattern;Decreased hip/knee flexion - right;Decreased weight shift to right    Gait Comments  Pt able to demonstrate normal gait pattern after cueing.      Self-Care   Self-Care  Scar Mobilizations    Scar Mobilizations  Provided instruction in incisional scar massage to R knee.      Exercises   Exercises  Knee/Hip      Knee/Hip Exercises: Aerobic   Recumbent Bike  L2 x 6 min      Knee/Hip Exercises: Standing   Terminal Knee Extension  Right;20 reps;Theraband    Theraband Level (Terminal Knee Extension)  Level 4 (Blue)      Knee/Hip Exercises: Supine   Short Arc Quad Sets  Right;10 reps;Strengthening    Short Arc Quad Sets Limitations  3#    Bridges  10 reps;Strengthening   5" hold   Straight Leg Raises  Right;15 reps;Strengthening    Straight Leg Raises Limitations  3#    Knee Extension  Right;10 reps   5" hold   Knee Extension Limitations  isometric quad/glute set into peanut ball    Knee Flexion  Right;AROM;10 reps    Knee Flexion Limitations  HS  curl with heels on peanut ball    Other Supine Knee/Hip Exercises  Straight leg bridge with heels on peanut ball 10 x 5"      Modalities   Modalities  Vasopneumatic      Vasopneumatic   Number Minutes Vasopneumatic   10 minutes    Vasopnuematic Location   Knee    Vasopneumatic Pressure  High    Vasopneumatic Temperature   coldest temp.      Manual Therapy   Manual Therapy  Soft tissue mobilization    Soft tissue mobilization  Scar tissue mobilization to R TKR incision             PT Education - 01/30/18 1150    Education Details  HEP update - bridges & TKE    Person(s) Educated  Patient    Methods  Explanation;Demonstration;Handout    Comprehension  Verbalized understanding;Returned demonstration       PT Short Term Goals - 01/30/18 1104      PT SHORT TERM GOAL #1   Title  Independent with initial HEP    Status  Achieved        PT Long Term Goals - 01/30/18 1107      PT LONG TERM GOAL #1   Title  Independent iwth advanced/ongoing HEP    Status  On-going      PT LONG TERM GOAL #2   Title  R knee AROM >/= 3-120 degrees to allow for normal gait and mobility    Status  On-going      PT LONG TERM GOAL #3   Title  R hip and knee strength >/= 4+/5 for improved stability    Status  On-going      PT LONG TERM GOAL #4   Title  Pt will ambulate w/o AD with normal gait pattern w/o evidence of R LE instability    Status  Partially Met      PT LONG TERM GOAL #5   Title  Pt  will ascend/descend 1 flight of stairs reciprocally with good step pattern to allow for increased ease of community access    Status  On-going            Plan - 01/30/18 1104    Clinical Impression Statement  [name] demonstrating good progress thus far with PT s/p R TKR - R knee AROM 4-108 & PROM 2-113. R hip and knee strength improving overall but greatest weakness in hip extension, therefore several exercises targeting this today and HEP updated accordingly. Gait pattern continues to lack  normal R hip and knee flexion, with pt demonstrating tendency to maintain "stiff knee" but can correct to good gait pattern with cues. Pt will continue to benefit from skilled PT to maximize functional ROM in R knee and overal LE strength for improved gait and functional mobility/activity tolerance.    Rehab Potential  Good    PT Frequency  --    PT Duration  --    PT Treatment/Interventions  Patient/family education;Neuromuscular re-education;Therapeutic exercise;Therapeutic activities;Functional mobility training;Gait training;Stair training;ADLs/Self Care Home Management;Electrical Stimulation;Cryotherapy;Vasopneumatic Device;Manual techniques;Passive range of motion;Dry needling;Taping;Balance training    PT Next Visit Plan  Monitor HEP adherence; progress standing therex per pt. tolerance (try theraband for standing 4 way hip), knee flexion/extension ROM activities; vaso    Consulted and Agree with Plan of Care  Patient       Patient will benefit from skilled therapeutic intervention in order to improve the following deficits and impairments:  Pain, Impaired flexibility, Hypomobility, Decreased range of motion, Decreased strength, Decreased scar mobility, Increased muscle spasms, Increased fascial restricitons, Decreased mobility, Difficulty walking, Abnormal gait, Decreased activity tolerance, Decreased balance, Postural dysfunction  Visit Diagnosis: Acute pain of right knee  Stiffness of right knee, not elsewhere classified  Muscle weakness (generalized)  Other abnormalities of gait and mobility  Difficulty in walking, not elsewhere classified  Localized edema     Problem List Patient Active Problem List   Diagnosis Date Noted  . Primary osteoarthritis of right knee 12/19/2017  . S/P total knee arthroplasty, right 12/19/2017    Percival Spanish, PT, MPT 01/30/2018, 12:11 PM  [facility] [address]  [place], [place], [address] Phone: [phone]   Fax:  [phone]  Name: Willie Parker MRN: [id number] Date of Birth: 01/23/1956

## 2018-01-31 ENCOUNTER — Ambulatory Visit (INDEPENDENT_AMBULATORY_CARE_PROVIDER_SITE_OTHER): Payer: Medicaid Other | Admitting: Orthopaedic Surgery

## 2018-01-31 ENCOUNTER — Encounter (INDEPENDENT_AMBULATORY_CARE_PROVIDER_SITE_OTHER): Payer: Self-pay | Admitting: Orthopaedic Surgery

## 2018-01-31 ENCOUNTER — Ambulatory Visit (INDEPENDENT_AMBULATORY_CARE_PROVIDER_SITE_OTHER): Payer: Medicaid Other

## 2018-01-31 DIAGNOSIS — Z96651 Presence of right artificial knee joint: Secondary | ICD-10-CM

## 2018-01-31 NOTE — Progress Notes (Signed)
   Post-Op Visit Note   Patient: Willie Parker           Date of Birth: 09/15/1955           MRN: [id number] Visit Date: 01/31/2018 PCP: Gildardo Pounds, NP   Assessment & Plan:  Chief Complaint:  Chief Complaint  Patient presents with  . Right Knee - Pain   Visit Diagnoses:  1. S/P total knee arthroplasty, right     Plan: Mr. Hammerschmidt is 6 weeks status post press-fit right total knee replacement.  He states that he is doing significantly better.  He is doing physical therapy twice a week.  He examined with a cane.  His range of motion is 0 to 118 degrees.  He is happy with his progress.  His surgical scar is fully healed.  He does have mild to moderate swelling.  From my standpoint he is doing very well and he is happy with his progress.  We will recheck him in 6 weeks for his 67-month checkup.  He will call us if he needs to have any dental work done.  Follow-Up Instructions: Return in about 6 weeks (around 03/14/2018).   Orders:  Orders Placed This Encounter  Procedures  . XR KNEE 3 VIEW RIGHT   No orders of the defined types were placed in this encounter.   Imaging: Xr Knee 3 View Right  Result Date: 01/31/2018 Stable press-fit right total knee replacement.   PMFS History: Patient Active Problem List   Diagnosis Date Noted  . Primary osteoarthritis of right knee 12/19/2017  . S/P total knee arthroplasty, right 12/19/2017   Past Medical History:  Diagnosis Date  . Arthritis   . Knee pain     Family History  Problem Relation Age of Onset  . Diabetes Mother   . Hypertension Mother     Past Surgical History:  Procedure Laterality Date  . MANDIBLE FRACTURE SURGERY     when he was 62 year old  . TOTAL KNEE ARTHROPLASTY Right 12/19/2017   Procedure: RIGHT TOTAL KNEE ARTHROPLASTY;  Surgeon: Leandrew Koyanagi, MD;  Location: [place];  Service: Orthopedics;  Laterality: Right;   Social History   Occupational History  . Not on file  Tobacco Use  . Smoking status:  Current Every Day Smoker    Packs/day: 0.25    Types: Cigarettes  . Smokeless tobacco: Never Used  Substance and Sexual Activity  . Alcohol use: Not Currently    Comment: has not had alcohol for over a month  . Drug use: Never  . Sexual activity: Not Currently

## 2018-02-01 ENCOUNTER — Ambulatory Visit: Payer: Medicaid Other

## 2018-02-01 DIAGNOSIS — R600 Localized edema: Secondary | ICD-10-CM

## 2018-02-01 DIAGNOSIS — M25561 Pain in right knee: Secondary | ICD-10-CM

## 2018-02-01 DIAGNOSIS — R2689 Other abnormalities of gait and mobility: Secondary | ICD-10-CM

## 2018-02-01 DIAGNOSIS — M25661 Stiffness of right knee, not elsewhere classified: Secondary | ICD-10-CM

## 2018-02-01 DIAGNOSIS — R262 Difficulty in walking, not elsewhere classified: Secondary | ICD-10-CM

## 2018-02-01 DIAGNOSIS — M6281 Muscle weakness (generalized): Secondary | ICD-10-CM

## 2018-02-01 NOTE — Therapy (Signed)
[facility] [address]  [place], [place], [address] Phone: [phone]   Fax:  [phone]  Physical Therapy Treatment  Patient Details  Name: Willie Parker MRN: [id number] Date of Birth: 08/03/1955 Referring Provider (PT): Azucena Cecil, MD   Encounter Date: 02/01/2018  PT End of Session - 02/01/18 1104    Visit Number  6    Number of Visits  13    Date for PT Re-Evaluation  02/28/18    Authorization Type  Medicaid    Authorization Time Period  01/18/18 - 02/28/18    Authorization - Visit Number  5    Authorization - Number of Visits  12    PT Start Time  2706    PT Stop Time  1149    PT Time Calculation (min)  52 min    Activity Tolerance  Patient tolerated treatment well    Behavior During Therapy  [facility] for tasks assessed/performed       Past Medical History:  Diagnosis Date  . Arthritis   . Knee pain     Past Surgical History:  Procedure Laterality Date  . MANDIBLE FRACTURE SURGERY     when he was 62 year old  . TOTAL KNEE ARTHROPLASTY Right 12/19/2017   Procedure: RIGHT TOTAL KNEE ARTHROPLASTY;  Surgeon: Leandrew Koyanagi, MD;  Location: [place];  Service: Orthopedics;  Laterality: Right;    There were no vitals filed for this visit.  Subjective Assessment - 02/01/18 1102    Subjective  Primary complaint today is knee stiffness     Pertinent History  R TKR 12/19/17    Patient Stated Goals  "knee back to 90%"    Currently in Pain?  Yes    Pain Score  2     Pain Location  Knee    Pain Orientation  Right    Pain Descriptors / Indicators  Aching;Sore    Pain Type  Surgical pain;Acute pain    Pain Frequency  Constant    Aggravating Factors   bending     Multiple Pain Sites  No                       [facility] Adult PT Treatment/Exercise - 02/01/18 1113      Ambulation/Gait   Stairs  Yes    Stairs Assistance  6: Modified independent (Device/Increase time)    Stair Management  Technique  One rail Right;Alternating pattern    Number of Stairs  13    Height of Stairs  7    Gait Comments  Cues required to slow descending; notable weakness at R hip and R quad descending       Knee/Hip Exercises: Aerobic   Recumbent Bike  L2 x 7 min      Knee/Hip Exercises: Standing   Heel Raises  Both;20 reps    Forward Lunges  Right;Left;10 reps    Forward Lunges Limitations  Minor cueing for LE spacing     Step Down  Right;10 reps;Hand Hold: 2;Step Height: 4"   Cues required for proper alignment    SLS  R SLS 2 x 15 sec    pt. noting difficulty with this    Other Standing Knee Exercises  B side stepping with red TB at ankles 2 x 25 ft       Knee/Hip Exercises: Supine   Bridges with Clamshell  Both;15 reps   with isometric hip abd/ER  into red TB at knee s     Knee/Hip Exercises: Sidelying   Hip ABduction  Right;15 reps    Hip ADduction  Right;15 reps    Clams  R clam shell with red TB at knees x 10 reps       Knee/Hip Exercises: Prone   Hamstring Curl  10 reps;3 seconds    Hip Extension  Right;15 reps      Vasopneumatic   Number Minutes Vasopneumatic   10 minutes    Vasopnuematic Location   Knee    Vasopneumatic Pressure  Medium    Vasopneumatic Temperature   coldest temp.      Manual Therapy   Manual Therapy  Soft tissue mobilization;Joint mobilization    Joint Mobilization  R patellar mobs all directions - good mobility     Soft tissue mobilization  Scar tissue mobilization to R TKR incision               PT Short Term Goals - 01/30/18 1104      PT SHORT TERM GOAL #1   Title  Independent with initial HEP    Status  Achieved        PT Long Term Goals - 01/30/18 1107      PT LONG TERM GOAL #1   Title  Independent iwth advanced/ongoing HEP    Status  On-going      PT LONG TERM GOAL #2   Title  R knee AROM >/= 3-120 degrees to allow for normal gait and mobility    Status  On-going      PT LONG TERM GOAL #3   Title  R hip and knee strength  >/= 4+/5 for improved stability    Status  On-going      PT LONG TERM GOAL #4   Title  Pt will ambulate w/o AD with normal gait pattern w/o evidence of R LE instability    Status  Partially Met      PT LONG TERM GOAL #5   Title  Pt will ascend/descend 1 flight of stairs reciprocally with good step pattern to allow for increased ease of community access    Status  On-going            Plan - 02/01/18 1109    Clinical Impression Statement  Willie Parker reporting MD pleased with his progress at recent f/u.  Tolerated addition of side stepping with red TB at ankles, SLS holds, forward lunges, and proximal hip strengthening in sidelying well today.  Requires occasional cueing for proper technique with activities in session.  Noted some remaining proximal hip weakness and R quad weakness with stair navigation today thus remainder of session focused on targeted strengthening activities.  Ended visit with ice/compression to R knee to reduce post-exercise soreness and swelling.      PT Treatment/Interventions  Patient/family education;Neuromuscular re-education;Therapeutic exercise;Therapeutic activities;Functional mobility training;Gait training;Stair training;ADLs/Self Care Home Management;Electrical Stimulation;Cryotherapy;Vasopneumatic Device;Manual techniques;Passive range of motion;Dry needling;Taping;Balance training    PT Next Visit Plan  4-way hip; Progress standing therex per pt. tolerance, knee flexion/extension ROM activities; vaso    Consulted and Agree with Plan of Care  Patient       Patient will benefit from skilled therapeutic intervention in order to improve the following deficits and impairments:  Pain, Impaired flexibility, Hypomobility, Decreased range of motion, Decreased strength, Decreased scar mobility, Increased muscle spasms, Increased fascial restricitons, Decreased mobility, Difficulty walking, Abnormal gait, Decreased activity tolerance, Decreased balance, Postural  dysfunction  Visit Diagnosis:  Acute pain of right knee  Stiffness of right knee, not elsewhere classified  Muscle weakness (generalized)  Other abnormalities of gait and mobility  Difficulty in walking, not elsewhere classified  Localized edema     Problem List Patient Active Problem List   Diagnosis Date Noted  . Primary osteoarthritis of right knee 12/19/2017  . S/P total knee arthroplasty, right 12/19/2017    Bess Harvest, PTA 02/01/18 12:03 PM    [facility] [address]  [place], [place], [address] Phone: [phone]   Fax:  [phone]  Name: Willie Parker MRN: [id number] Date of Birth: [birth year]

## 2018-02-06 ENCOUNTER — Ambulatory Visit: Payer: Medicaid Other

## 2018-02-06 DIAGNOSIS — M6281 Muscle weakness (generalized): Secondary | ICD-10-CM

## 2018-02-06 DIAGNOSIS — M25661 Stiffness of right knee, not elsewhere classified: Secondary | ICD-10-CM

## 2018-02-06 DIAGNOSIS — R2689 Other abnormalities of gait and mobility: Secondary | ICD-10-CM

## 2018-02-06 DIAGNOSIS — R262 Difficulty in walking, not elsewhere classified: Secondary | ICD-10-CM

## 2018-02-06 DIAGNOSIS — R600 Localized edema: Secondary | ICD-10-CM

## 2018-02-06 DIAGNOSIS — M25561 Pain in right knee: Secondary | ICD-10-CM

## 2018-02-06 NOTE — Therapy (Signed)
[facility] [address]  [place], [place], [address] Phone: [phone]   Fax:  [phone]  Physical Therapy Treatment  Patient Details  Name: Willie Parker MRN: [id number] Date of Birth: 1955/11/01 Referring Provider (PT): Azucena Cecil, MD   Encounter Date: 02/06/2018  PT End of Session - 02/06/18 1109    Visit Number  7    Number of Visits  13    Date for PT Re-Evaluation  02/28/18    Authorization Type  Medicaid    Authorization Time Period  01/18/18 - 02/28/18    Authorization - Visit Number  6    Authorization - Number of Visits  12    PT Start Time  1100    PT Stop Time  1142    PT Time Calculation (min)  42 min    Activity Tolerance  Patient tolerated treatment well    Behavior During Therapy  [facility] for tasks assessed/performed       Past Medical History:  Diagnosis Date  . Arthritis   . Knee pain     Past Surgical History:  Procedure Laterality Date  . MANDIBLE FRACTURE SURGERY     when he was 62 year old  . TOTAL KNEE ARTHROPLASTY Right 12/19/2017   Procedure: RIGHT TOTAL KNEE ARTHROPLASTY;  Surgeon: Leandrew Koyanagi, MD;  Location: [place];  Service: Orthopedics;  Laterality: Right;    There were no vitals filed for this visit.  Subjective Assessment - 02/06/18 1109    Subjective  Pt. reporting some R knee "stiffness" after ice last visit.      Pertinent History  R TKR 12/19/17    Patient Stated Goals  "knee back to 90%"    Currently in Pain?  No/denies    Pain Score  0-No pain    Multiple Pain Sites  No                       [facility] Adult PT Treatment/Exercise - 02/06/18 1118      Knee/Hip Exercises: Stretches   Sports administrator  Right;1 rep;60 seconds    Quad Stretch Limitations  prone with strap    Hip Flexor Stretch  Right;1 rep;60 seconds    Hip Flexor Stretch Limitations  mod thomas position with strap       Knee/Hip Exercises: Aerobic   Recumbent Bike  L2 x 7 min       Knee/Hip Exercises: Standing   Hip Flexion  Right;Left;10 reps;Knee straight;Stengthening    Hip Flexion Limitations  yellow TB at ankle; 2 ski poles     Hip ADduction  Right;Left;10 reps;Strengthening    Hip ADduction Limitations  yellow TB at ankle; 2 ski poles     Hip Abduction  Right;Left;10 reps;Knee straight;Stengthening    Abduction Limitations  yellow TB at ankles; 2 ski poles     Hip Extension  Right;Left;10 reps;Knee straight;Stengthening    Extension Limitations  yellow TB at ankle; 2 ski poles     Forward Step Up  Right;10 reps;Step Height: 8";Hand Hold: 1    Forward Step Up Limitations  light TM rail use     Wall Squat  10 reps;5 seconds    Wall Squat Limitations  cues required for even wt. distribution                PT Short Term Goals - 01/30/18 1104      PT SHORT TERM GOAL #1  Title  Independent with initial HEP    Status  Achieved        PT Long Term Goals - 01/30/18 1107      PT LONG TERM GOAL #1   Title  Independent iwth advanced/ongoing HEP    Status  On-going      PT LONG TERM GOAL #2   Title  R knee AROM >/= 3-120 degrees to allow for normal gait and mobility    Status  On-going      PT LONG TERM GOAL #3   Title  R hip and knee strength >/= 4+/5 for improved stability    Status  On-going      PT LONG TERM GOAL #4   Title  Pt will ambulate w/o AD with normal gait pattern w/o evidence of R LE instability    Status  Partially Met      PT LONG TERM GOAL #5   Title  Pt will ascend/descend 1 flight of stairs reciprocally with good step pattern to allow for increased ease of community access    Status  On-going            Plan - 02/06/18 1147    Clinical Impression Statement  Willie Parker doing well today however did note that ice/compression machine used last session left him feeling especially stiff afterward.  Feels he tolerated therex progression last visit well.  Tolerated addition of 4-way hip kicker with yellow TB, and  advancement of forward, lateral step-ups well today.  Ended visit with Willie Parker noting low-level R knee pain however requested to skip ice noting he will ice at home.  Pt. continues to report very limited compliance with HEP thus deferred HEP update at this time.  Strongly encouraged pt. to perform HEP for full benefit from therapy.  Will continue to progress toward goals.      Rehab Potential  Good    PT Frequency  2x / week    PT Treatment/Interventions  Patient/family education;Neuromuscular re-education;Therapeutic exercise;Therapeutic activities;Functional mobility training;Gait training;Stair training;ADLs/Self Care Home Management;Electrical Stimulation;Cryotherapy;Vasopneumatic Device;Manual techniques;Passive range of motion;Dry needling;Taping;Balance training    PT Next Visit Plan  Update HEP per pt. compliance with existing HEP; Progress standing therex per pt. tolerance, knee flexion/extension ROM activities; vaso    Consulted and Agree with Plan of Care  Patient       Patient will benefit from skilled therapeutic intervention in order to improve the following deficits and impairments:  Pain, Impaired flexibility, Hypomobility, Decreased range of motion, Decreased strength, Decreased scar mobility, Increased muscle spasms, Increased fascial restricitons, Decreased mobility, Difficulty walking, Abnormal gait, Decreased activity tolerance, Decreased balance, Postural dysfunction  Visit Diagnosis: Acute pain of right knee  Stiffness of right knee, not elsewhere classified  Muscle weakness (generalized)  Other abnormalities of gait and mobility  Difficulty in walking, not elsewhere classified  Localized edema     Problem List Patient Active Problem List   Diagnosis Date Noted  . Primary osteoarthritis of right knee 12/19/2017  . S/P total knee arthroplasty, right 12/19/2017    Bess Harvest, PTA 02/06/18 11:58 AM   [facility] [address]  [place], [place], [address] Phone: [phone]   Fax:  [phone]  Name: Willie Parker MRN: [id number] Date of Birth: March 11, 1955

## 2018-02-08 ENCOUNTER — Ambulatory Visit: Payer: Medicaid Other | Admitting: Physical Therapy

## 2018-02-08 ENCOUNTER — Encounter: Payer: Self-pay | Admitting: Physical Therapy

## 2018-02-08 DIAGNOSIS — R600 Localized edema: Secondary | ICD-10-CM

## 2018-02-08 DIAGNOSIS — M25561 Pain in right knee: Secondary | ICD-10-CM

## 2018-02-08 DIAGNOSIS — R2689 Other abnormalities of gait and mobility: Secondary | ICD-10-CM

## 2018-02-08 DIAGNOSIS — M6281 Muscle weakness (generalized): Secondary | ICD-10-CM

## 2018-02-08 DIAGNOSIS — M25661 Stiffness of right knee, not elsewhere classified: Secondary | ICD-10-CM

## 2018-02-08 DIAGNOSIS — R262 Difficulty in walking, not elsewhere classified: Secondary | ICD-10-CM

## 2018-02-08 NOTE — Therapy (Signed)
[place] [address]  [place], [place], [address] Phone: [phone]   Fax:  [phone]  Physical Therapy Treatment  Patient Details  Name: [place] MRN: [id number] Date of Birth: [birth year] Referring Provider (PT): Azucena Cecil, MD   Encounter Date: 02/08/2018  PT End of Session - 02/08/18 1059    Visit Number  8    Number of Visits  13    Date for PT Re-Evaluation  02/28/18    Authorization Type  Medicaid    Authorization Time Period  01/18/18 - 02/28/18    Authorization - Visit Number  7    Authorization - Number of Visits  12    PT Start Time  1059    PT Stop Time  1143    PT Time Calculation (min)  44 min    Activity Tolerance  Patient tolerated treatment well    Behavior During Therapy  [facility] for tasks assessed/performed       Past Medical History:  Diagnosis Date  . Arthritis   . Knee pain     Past Surgical History:  Procedure Laterality Date  . MANDIBLE FRACTURE SURGERY     when he was 62 year old  . TOTAL KNEE ARTHROPLASTY Right 12/19/2017   Procedure: RIGHT TOTAL KNEE ARTHROPLASTY;  Surgeon: Leandrew Koyanagi, MD;  Location: [place];  Service: Orthopedics;  Laterality: Right;    There were no vitals filed for this visit.  Subjective Assessment - 02/08/18 1101    Subjective  Pt reporting pain still worst at night, interfering with his sleep.    Pertinent History  R TKR 12/19/17    Patient Stated Goals  "knee back to 90%"    Currently in Pain?  Yes    Pain Score  2    up 5-6/10 at night   Pain Location  Knee    Pain Orientation  Right    Pain Descriptors / Indicators  Tightness   "stiffness"   Pain Type  Surgical pain;Acute pain    Pain Frequency  Intermittent         [facility] PT Assessment - 02/08/18 1059      Assessment   Medical Diagnosis  R TKR    Referring Provider (PT)  N. Eduard Roux, MD    Onset Date/Surgical Date  12/19/17    Next MD Visit  03/14/18                    [facility] Adult PT Treatment/Exercise - 02/08/18 1059      Exercises   Exercises  Knee/Hip      Knee/Hip Exercises: Stretches   Gastroc Stretch  Right;30 seconds;3 reps    Gastroc Stretch Limitations  prostretch      Knee/Hip Exercises: Aerobic   Recumbent Bike  L2 x 6 min      Knee/Hip Exercises: Machines for Strengthening   Cybex Knee Extension  B LE 15# x15; B con/R ecc 15# x15    Cybex Knee Flexion  B LE 20# x15; B con/R ecc 15# x15      Knee/Hip Exercises: Standing   Hip Flexion  Right;Left;10 reps;Knee straight;Knee bent;Stengthening   2 sets   Hip Flexion Limitations  red TB, 1 pole A (4 way hip); psoas march with looped red TB around foot    Hip ADduction  Right;Left;10 reps;Strengthening    Hip ADduction Limitations  red TB, 1 pole A    Hip  Abduction  Right;Left;10 reps;Knee straight;Stengthening    Abduction Limitations  red TB, 1 pole A    Hip Extension  Right;Left;10 reps;Knee straight;Stengthening    Extension Limitations  red TB, 1 pole A    Step Down  Right;10 reps;Step Height: 6";Step Height: 4";Hand Hold: 2;2 sets    Step Down Limitations  lateral (6") & fwd (4") eccentric lowering with heel touch - 1 set each    Other Standing Knee Exercises  R/L Fitter hip extension (1 black/1 blue) x15 each             PT Education - 02/08/18 1133    Education Details  HEP update - 4 way standing SLR with red TB    Person(s) Educated  Patient    Methods  Explanation;Demonstration;Handout    Comprehension  Verbalized understanding;Returned demonstration       PT Short Term Goals - 01/30/18 1104      PT SHORT TERM GOAL #1   Title  Independent with initial HEP    Status  Achieved        PT Long Term Goals - 01/30/18 1107      PT LONG TERM GOAL #1   Title  Independent iwth advanced/ongoing HEP    Status  On-going      PT LONG TERM GOAL #2   Title  R knee AROM >/= 3-120 degrees to allow for normal gait and mobility    Status   On-going      PT LONG TERM GOAL #3   Title  R hip and knee strength >/= 4+/5 for improved stability    Status  On-going      PT LONG TERM GOAL #4   Title  Pt will ambulate w/o AD with normal gait pattern w/o evidence of R LE instability    Status  Partially Met      PT LONG TERM GOAL #5   Title  Pt will ascend/descend 1 flight of stairs reciprocally with good step pattern to allow for increased ease of community access    Status  On-going            Plan - 02/08/18 1103    Clinical Impression Statement  [name] reporting no issues or concerns with current HEP but noncommittal as far as compliance at this time. Pt noting he "likes" the standing 4-way SLR with theraband, therefore provided instructions and red TB for addition to HEP. Continued proximal LE strengthening with emphasis on hip flexion/extension and added knee weight machines with good tolerance, but limited control still evident with forward step-down.    Rehab Potential  Good    PT Treatment/Interventions  Patient/family education;Neuromuscular re-education;Therapeutic exercise;Therapeutic activities;Functional mobility training;Gait training;Stair training;ADLs/Self Care Home Management;Electrical Stimulation;Cryotherapy;Vasopneumatic Device;Manual techniques;Passive range of motion;Dry needling;Taping;Balance training    PT Next Visit Plan  Progress standing therex per pt. tolerance, knee flexion/extension ROM activities; vaso    Consulted and Agree with Plan of Care  Patient       Patient will benefit from skilled therapeutic intervention in order to improve the following deficits and impairments:  Pain, Impaired flexibility, Hypomobility, Decreased range of motion, Decreased strength, Decreased scar mobility, Increased muscle spasms, Increased fascial restricitons, Decreased mobility, Difficulty walking, Abnormal gait, Decreased activity tolerance, Decreased balance, Postural dysfunction  Visit Diagnosis: Acute pain  of right knee  Stiffness of right knee, not elsewhere classified  Muscle weakness (generalized)  Other abnormalities of gait and mobility  Difficulty in walking, not elsewhere classified  Localized edema  Problem List Patient Active Problem List   Diagnosis Date Noted  . Primary osteoarthritis of right knee 12/19/2017  . S/P total knee arthroplasty, right 12/19/2017    Percival Spanish, PT, MPT 02/08/2018, 11:46 AM  [facility] [address]  [place], [place], [address] Phone: [phone]   Fax:  [phone]  Name: Jaymie Misch MRN: [id number] Date of Birth: [birth year]

## 2018-02-13 ENCOUNTER — Ambulatory Visit: Payer: Medicaid Other

## 2018-02-13 DIAGNOSIS — M25661 Stiffness of right knee, not elsewhere classified: Secondary | ICD-10-CM

## 2018-02-13 DIAGNOSIS — M25561 Pain in right knee: Secondary | ICD-10-CM | POA: Diagnosis not present

## 2018-02-13 DIAGNOSIS — R262 Difficulty in walking, not elsewhere classified: Secondary | ICD-10-CM

## 2018-02-13 DIAGNOSIS — R2689 Other abnormalities of gait and mobility: Secondary | ICD-10-CM

## 2018-02-13 DIAGNOSIS — R600 Localized edema: Secondary | ICD-10-CM

## 2018-02-13 DIAGNOSIS — M6281 Muscle weakness (generalized): Secondary | ICD-10-CM

## 2018-02-13 NOTE — Therapy (Signed)
[facility] [address]  [place], [place], [address] Phone: [phone]   Fax:  [phone]  Physical Therapy Treatment  Patient Details  Name: [place] MRN: [id number] Date of Birth: 1955/11/13 Referring Provider (PT): Azucena Cecil, MD   Encounter Date: 02/13/2018  PT End of Session - 02/13/18 1108    Visit Number  9    Number of Visits  13    Date for PT Re-Evaluation  02/28/18    Authorization Type  Medicaid    Authorization Time Period  01/18/18 - 02/28/18    Authorization - Visit Number  8    Authorization - Number of Visits  12    PT Start Time  1100    PT Stop Time  1141    PT Time Calculation (min)  41 min    Activity Tolerance  Patient tolerated treatment well    Behavior During Therapy  [facility] for tasks assessed/performed       Past Medical History:  Diagnosis Date  . Arthritis   . Knee pain     Past Surgical History:  Procedure Laterality Date  . MANDIBLE FRACTURE SURGERY     when he was 62 year old  . TOTAL KNEE ARTHROPLASTY Right 12/19/2017   Procedure: RIGHT TOTAL KNEE ARTHROPLASTY;  Surgeon: Leandrew Koyanagi, MD;  Location: [facility];  Service: Orthopedics;  Laterality: Right;    There were no vitals filed for this visit.  Subjective Assessment - 02/13/18 1106    Subjective  Doing well with no new complaints.      Pertinent History  R TKR 12/19/17    Patient Stated Goals  "knee back to 90%"    Currently in Pain?  Yes    Pain Score  2     Pain Location  Knee    Pain Orientation  Right    Pain Descriptors / Indicators  Tightness    Pain Type  Surgical pain;Acute pain    Pain Frequency  Intermittent    Aggravating Factors   bending     Multiple Pain Sites  No                       [facility] Adult PT Treatment/Exercise - 02/13/18 1118      Knee/Hip Exercises: Stretches   Quad Stretch  Right;1 rep;60 seconds    Quad Stretch Limitations  prone with strap    Gastroc  Stretch  Right;1 rep;60 seconds    Gastroc Stretch Limitations  prostretch      Knee/Hip Exercises: Aerobic   Recumbent Bike  L3 x 7 min      Knee/Hip Exercises: Machines for Strengthening   Cybex Knee Flexion  B LE 25# x 15; B con/R ecc 20# x 10 reps     Cybex Leg Press  B LE's 25# x 15 reps       Knee/Hip Exercises: Standing   Heel Raises  Both;20 reps    Heel Raises Limitations  Counter     Forward Step Up  Right;10 reps;Step Height: 8";Hand Hold: 1    Step Down  Right;10 reps;1 set;Hand Hold: 2;Step Height: 8"    Step Down Limitations  lateral (6")    Wall Squat  15 reps;5 seconds    Wall Squat Limitations  to 80 dg     SLS  R SLS on foam 2 x 10 sec    intermittent UE support required on  chair    SLS with Vectors  R SLS with opposite LE cone nock over/righting 3 x 3 cones                PT Short Term Goals - 01/30/18 1104      PT SHORT TERM GOAL #1   Title  Independent with initial HEP    Status  Achieved        PT Long Term Goals - 01/30/18 1107      PT LONG TERM GOAL #1   Title  Independent iwth advanced/ongoing HEP    Status  On-going      PT LONG TERM GOAL #2   Title  R knee AROM >/= 3-120 degrees to allow for normal gait and mobility    Status  On-going      PT LONG TERM GOAL #3   Title  R hip and knee strength >/= 4+/5 for improved stability    Status  On-going      PT LONG TERM GOAL #4   Title  Pt will ambulate w/o AD with normal gait pattern w/o evidence of R LE instability    Status  Partially Met      PT LONG TERM GOAL #5   Title  Pt will ascend/descend 1 flight of stairs reciprocally with good step pattern to allow for increased ease of community access    Status  On-going            Plan - 02/13/18 1108    Clinical Impression Statement  Pt. reporting he still has difficult navigating stairs.  Admitted to poor HEP adherence over weekend.  Tolerated progression to 6" lateral step-down today however did require cueing for slow  pacing.  Still has proximal hip weakness evident with single leg stance activities today.  Did progress single leg stance proprioception activities today and introduced leg press.  Ended visit with pt. pain free thus modalities deferred.      Rehab Potential  Good    PT Frequency  2x / week    PT Duration  6 weeks    PT Treatment/Interventions  Patient/family education;Neuromuscular re-education;Therapeutic exercise;Therapeutic activities;Functional mobility training;Gait training;Stair training;ADLs/Self Care Home Management;Electrical Stimulation;Cryotherapy;Vasopneumatic Device;Manual techniques;Passive range of motion;Dry needling;Taping;Balance training    PT Next Visit Plan  10th visit; FOTO; Progress standing therex per pt. tolerance, knee flexion/extension ROM activities; vaso    Consulted and Agree with Plan of Care  Patient       Patient will benefit from skilled therapeutic intervention in order to improve the following deficits and impairments:  Pain, Impaired flexibility, Hypomobility, Decreased range of motion, Decreased strength, Decreased scar mobility, Increased muscle spasms, Increased fascial restricitons, Decreased mobility, Difficulty walking, Abnormal gait, Decreased activity tolerance, Decreased balance, Postural dysfunction  Visit Diagnosis: Acute pain of right knee  Stiffness of right knee, not elsewhere classified  Muscle weakness (generalized)  Other abnormalities of gait and mobility  Difficulty in walking, not elsewhere classified  Localized edema     Problem List Patient Active Problem List   Diagnosis Date Noted  . Primary osteoarthritis of right knee 12/19/2017  . S/P total knee arthroplasty, right 12/19/2017    Bess Harvest, PTA 02/13/18 12:07 PM   [facility] [address]  [place], [place], [address] Phone: [phone]   Fax:  [phone]  Name: [place] MRN:  [id number] Date of Birth: [birth year]

## 2018-02-15 ENCOUNTER — Ambulatory Visit: Payer: Medicaid Other

## 2018-02-15 DIAGNOSIS — M25561 Pain in right knee: Secondary | ICD-10-CM | POA: Diagnosis not present

## 2018-02-15 DIAGNOSIS — R2689 Other abnormalities of gait and mobility: Secondary | ICD-10-CM

## 2018-02-15 DIAGNOSIS — R600 Localized edema: Secondary | ICD-10-CM

## 2018-02-15 DIAGNOSIS — M6281 Muscle weakness (generalized): Secondary | ICD-10-CM

## 2018-02-15 DIAGNOSIS — M25661 Stiffness of right knee, not elsewhere classified: Secondary | ICD-10-CM

## 2018-02-15 DIAGNOSIS — R262 Difficulty in walking, not elsewhere classified: Secondary | ICD-10-CM

## 2018-02-15 NOTE — Therapy (Addendum)
[facility] [address]  [place], [place], [address] Phone: [phone]   Fax:  [phone]  Physical Therapy Treatment  Patient Details  Name: [place] MRN: [id number] Date of Birth: 1955/11/08 Referring Provider (PT): Azucena Cecil, MD   Encounter Date: 02/15/2018  PT End of Session - 02/15/18 1105    Visit Number  10    Number of Visits  13    Date for PT Re-Evaluation  02/28/18    Authorization Type  Medicaid    Authorization Time Period  01/18/18 - 02/28/18    Authorization - Visit Number  9    Authorization - Number of Visits  12    PT Start Time  1100    PT Stop Time  1147    PT Time Calculation (min)  47 min    Activity Tolerance  Patient tolerated treatment well    Behavior During Therapy  [facility] for tasks assessed/performed       Past Medical History:  Diagnosis Date  . Arthritis   . Knee pain     Past Surgical History:  Procedure Laterality Date  . MANDIBLE FRACTURE SURGERY     when he was 62 year old  . TOTAL KNEE ARTHROPLASTY Right 12/19/2017   Procedure: RIGHT TOTAL KNEE ARTHROPLASTY;  Surgeon: Leandrew Koyanagi, MD;  Location: [facility];  Service: Orthopedics;  Laterality: Right;    There were no vitals filed for this visit.  Subjective Assessment - 02/15/18 1103    Subjective  Pt. admitting to partial compliance with HEP.      Pertinent History  R TKR 12/19/17    Patient Stated Goals  "knee back to 90%"    Currently in Pain?  Yes    Pain Score  2     Pain Location  Knee    Pain Orientation  Right    Pain Descriptors / Indicators  Tightness    Pain Type  Surgical pain    Pain Frequency  Intermittent    Multiple Pain Sites  No         [facility] PT Assessment - 02/15/18 1115      AROM   Right/Left Knee  Right    Right Knee Extension  4    Right Knee Flexion  110      PROM   Right/Left Knee  Right    Right Knee Extension  2    Right Knee Flexion  116      Strength   Strength Assessment Site  Hip;Knee    Right/Left Hip  Right;Left    Right Hip Flexion  4/5    Right Hip Extension  4+/5    Right Hip ABduction  4+/5    Right Hip ADduction  4+/5    Left Hip Flexion  4+/5    Left Hip Extension  4+/5    Left Hip ABduction  4+/5    Left Hip ADduction  4+/5    Right/Left Knee  Right;Left    Right Knee Flexion  4+/5    Right Knee Extension  5/5    Left Knee Flexion  4+/5    Left Knee Extension  5/5                   [facility] Adult PT Treatment/Exercise - 02/15/18 1124      Ambulation/Gait   Ambulation/Gait  Yes    Ambulation/Gait Assistance  5: Supervision    Ambulation/Gait Assistance  Details  Cues provided for increased B arm swing and B heel strike provided    Ambulation Distance (Feet)  180 Feet    Assistive device  None    Gait Pattern  Step-through pattern;Decreased hip/knee flexion - right    Ambulation Surface  Level;Indoor    Stairs  Yes    Stairs Assistance  6: Modified independent (Device/Increase time)    Stair Management Technique  One rail Right;Alternating pattern    Number of Stairs  13    Height of Stairs  7    Gait Comments  Pt. able to demo improved quad/hip stability navigating stairs today      Knee/Hip Exercises: Standing   Hip Flexion  Right;Left;10 reps;Stengthening    Hip Flexion Limitations  Marching with red looped TB at forefeet    Side Lunges  Right;Left;10 reps    Side Lunges Limitations  TRX    Functional Squat  15 reps;5 seconds    Functional Squat Limitations  TRX       Knee/Hip Exercises: Supine   Straight Leg Raises  Right;15 reps;Strengthening    Straight Leg Raises Limitations  3#      Manual Therapy   Manual Therapy  Soft tissue mobilization    Manual therapy comments  hooklying     Joint Mobilization  R A/P mobs (good mobility), R patellar mobs (good mobility) for ROM                PT Short Term Goals - 01/30/18 1104      PT SHORT TERM GOAL #1   Title  Independent with initial  HEP    Status  Achieved        PT Long Term Goals - 02/15/18 1118      PT LONG TERM GOAL #1   Title  Independent iwth advanced/ongoing HEP    Status  Partially Met      PT LONG TERM GOAL #2   Title  R knee AROM >/= 3-120 degrees to allow for normal gait and mobility    Status  On-going   R knee AROM 4-113 dg on 12.18.19     PT LONG TERM GOAL #3   Title  R hip and knee strength >/= 4+/5 for improved stability    Status  Partially Met      PT LONG TERM GOAL #4   Title  Pt will ambulate w/o AD with normal gait pattern w/o evidence of R LE instability    Status  Partially Met      PT LONG TERM GOAL #5   Title  Pt will ascend/descend 1 flight of stairs reciprocally with good step pattern to allow for increased ease of community access    Status  Achieved            Plan - 02/15/18 1109    Clinical Impression Statement  Pt. doing well today with no new complaints.  Able to partially meet strength goal with MMT today now only 4/5 strength with R hip flexion.  Pt. able to navigate stairs reciprocally today with one rail use and proper stepping pattern meeting LTG #5.  Focused therex for R quad and hip flexion strengthening today to focus on remaining strength deficits.  R knee ROM progressing today 4-113 dg AROM, 2-116 dg PROM.  Pt. encouraged to perform HEP HS, gastric stretching previously issued to him as these groups with some visible tightness today.  Ended visit pain free thus modalities deferred.  Will  continue to progress toward goals.     Rehab Potential  Good    PT Frequency  2x / week    PT Duration  6 weeks    PT Treatment/Interventions  Patient/family education;Neuromuscular re-education;Therapeutic exercise;Therapeutic activities;Functional mobility training;Gait training;Stair training;ADLs/Self Care Home Management;Electrical Stimulation;Cryotherapy;Vasopneumatic Device;Manual techniques;Passive range of motion;Dry needling;Taping;Balance training    PT Next Visit  Plan  Progress standing therex per pt. tolerance, knee flexion/extension ROM activities; vaso    Consulted and Agree with Plan of Care  Patient       Patient will benefit from skilled therapeutic intervention in order to improve the following deficits and impairments:  Pain, Impaired flexibility, Hypomobility, Decreased range of motion, Decreased strength, Decreased scar mobility, Increased muscle spasms, Increased fascial restricitons, Decreased mobility, Difficulty walking, Abnormal gait, Decreased activity tolerance, Decreased balance, Postural dysfunction  Visit Diagnosis: Acute pain of right knee  Stiffness of right knee, not elsewhere classified  Muscle weakness (generalized)  Other abnormalities of gait and mobility  Difficulty in walking, not elsewhere classified  Localized edema     Problem List Patient Active Problem List   Diagnosis Date Noted  . Primary osteoarthritis of right knee 12/19/2017  . S/P total knee arthroplasty, right 12/19/2017    Bess Harvest, PTA 02/15/18 12:09 PM   [facility] [address]  [place], [place], [address] Phone: [phone]   Fax:  [phone]  Name: [place] MRN: [id number] Date of Birth: [birth year]

## 2018-02-17 ENCOUNTER — Encounter: Payer: Self-pay | Admitting: Nurse Practitioner

## 2018-02-20 ENCOUNTER — Ambulatory Visit: Payer: Medicaid Other

## 2018-02-20 DIAGNOSIS — R262 Difficulty in walking, not elsewhere classified: Secondary | ICD-10-CM

## 2018-02-20 DIAGNOSIS — M6281 Muscle weakness (generalized): Secondary | ICD-10-CM

## 2018-02-20 DIAGNOSIS — M25561 Pain in right knee: Secondary | ICD-10-CM

## 2018-02-20 DIAGNOSIS — M25661 Stiffness of right knee, not elsewhere classified: Secondary | ICD-10-CM

## 2018-02-20 DIAGNOSIS — R600 Localized edema: Secondary | ICD-10-CM

## 2018-02-20 DIAGNOSIS — R2689 Other abnormalities of gait and mobility: Secondary | ICD-10-CM

## 2018-02-20 NOTE — Therapy (Signed)
[facility] [address]  [place], [place], [address] Phone: [phone]   Fax:  [phone]  Physical Therapy Treatment  Patient Details  Name: Willie Parker MRN: [id number] Date of Birth: 1956-01-15 Referring Provider (PT): Azucena Cecil, MD   Encounter Date: 02/20/2018  PT End of Session - 02/20/18 1114    Visit Number  11    Number of Visits  13    Date for PT Re-Evaluation  02/28/18    Authorization Type  Medicaid    Authorization Time Period  01/18/18 - 02/28/18    Authorization - Visit Number  10    Authorization - Number of Visits  12    PT Start Time  1100    PT Stop Time  1156    PT Time Calculation (min)  56 min    Activity Tolerance  Patient tolerated treatment well    Behavior During Therapy  [facility] for tasks assessed/performed       Past Medical History:  Diagnosis Date  . Arthritis   . Knee pain     Past Surgical History:  Procedure Laterality Date  . MANDIBLE FRACTURE SURGERY     when he was 62 year old  . TOTAL KNEE ARTHROPLASTY Right 12/19/2017   Procedure: RIGHT TOTAL KNEE ARTHROPLASTY;  Surgeon: Leandrew Koyanagi, MD;  Location: [facility];  Service: Orthopedics;  Laterality: Right;    There were no vitals filed for this visit.  Subjective Assessment - 02/20/18 1113    Subjective  Pt. reporting increased pain in R knee today due to helping take car of mother over weekend.      Pertinent History  R TKR 12/19/17    Patient Stated Goals  "knee back to 90%"    Currently in Pain?  Yes    Pain Score  5     Pain Location  Knee    Pain Orientation  Right    Pain Descriptors / Indicators  Tightness    Pain Type  Surgical pain    Aggravating Factors   bending, lifting    Multiple Pain Sites  No                       [facility] Adult PT Treatment/Exercise - 02/20/18 1116      Self-Care   Self-Care  Other Self-Care Comments    Other Self-Care Comments   Reviewed and consolidated  HEP (see pt. education); pt. verbalized understanding       Knee/Hip Exercises: Stretches   Passive Hamstring Stretch  Right;2 reps;30 seconds    Passive Hamstring Stretch Limitations  strap     Quad Stretch  2 reps;Right;60 seconds    Quad Stretch Limitations  prone with strap    Knee: Self-Stretch to increase Flexion  --   5" x 10 reps leaning into TM    Other Knee/Hip Stretches  Prone hang R knee extension stretch x 2 min       Knee/Hip Exercises: Aerobic   Recumbent Bike  L3 x 7 min      Knee/Hip Exercises: Machines for Strengthening   Cybex Knee Flexion  B LE 30# x 15       Knee/Hip Exercises: Standing   Hip Flexion  Right;Left;Stengthening;15 reps;Knee bent    Hip Flexion Limitations  Marching with red looped TB at forefeet   standing on airex pad    Forward Step Up  Right;Step Height: 8";Hand Hold:  1;15 reps    Step Down  Right;10 reps;1 set;Hand Hold: 2;Step Height: 8"    Step Down Limitations  lateral on 6" step     SLS  R SLS on foam 3 x 20 sec; light UE support on chair     Other Standing Knee Exercises  R retro step up to 7" step x 7 reps       Manual Therapy   Manual Therapy  Joint mobilization    Manual therapy comments  supine     Joint Mobilization  R A/P mobs for improved extension ROM              PT Education - 02/20/18 1231    Education Details  HEP consolidated to 4-way hip kicker with red TB, counter squat, knee flexion lunge stretch, HS stretch supine with strap, prone extension hang, Seated heel prop extension stretch     Person(s) Educated  Patient    Methods  Explanation;Verbal cues;Handout    Comprehension  Verbalized understanding;Verbal cues required;Need further instruction       PT Short Term Goals - 01/30/18 1104      PT SHORT TERM GOAL #1   Title  Independent with initial HEP    Status  Achieved        PT Long Term Goals - 02/20/18 1241      PT LONG TERM GOAL #1   Title  Independent iwth advanced/ongoing HEP    Status   Partially Met      PT LONG TERM GOAL #2   Title  R knee AROM >/= 3-120 degrees to allow for normal gait and mobility    Status  On-going   R knee AROM 4-110 dg on 12.18.19     PT LONG TERM GOAL #3   Title  R hip and knee strength >/= 4+/5 for improved stability    Status  Partially Met      PT LONG TERM GOAL #4   Title  Pt will ambulate w/o AD with normal gait pattern w/o evidence of R LE instability    Status  Partially Met      PT LONG TERM GOAL #5   Title  Pt will ascend/descend 1 flight of stairs reciprocally with good step pattern to allow for increased ease of community access    Status  Achieved            Plan - 02/20/18 1114    Clinical Impression Statement  Willie Parker reporting he wishes to finish with therapy after next visit as sister is changing MD's and this will make therapy less convenient for him.  Willie Parker has met or partially met all LTG's with exception of ROM goal as last measured at 4-11o dg AROM, 2-116 dg PROM on 12.18.  Session focused on activities to promote TKE and extension ROM as well as consolidated HEP (see education).  Will plan for final goal testing at upcoming visit in prep for transition to home program.      Rehab Potential  Good    PT Frequency  2x / week    PT Duration  6 weeks    PT Treatment/Interventions  Patient/family education;Neuromuscular re-education;Therapeutic exercise;Therapeutic activities;Functional mobility training;Gait training;Stair training;ADLs/Self Care Home Management;Electrical Stimulation;Cryotherapy;Vasopneumatic Device;Manual techniques;Passive range of motion;Dry needling;Taping;Balance training    PT Next Visit Plan  final goal testing in prep for d/c/hold     Consulted and Agree with Plan of Care  Patient       Patient will  benefit from skilled therapeutic intervention in order to improve the following deficits and impairments:  Pain, Impaired flexibility, Hypomobility, Decreased range of motion, Decreased strength,  Decreased scar mobility, Increased muscle spasms, Increased fascial restricitons, Decreased mobility, Difficulty walking, Abnormal gait, Decreased activity tolerance, Decreased balance, Postural dysfunction  Visit Diagnosis: Acute pain of right knee  Stiffness of right knee, not elsewhere classified  Muscle weakness (generalized)  Other abnormalities of gait and mobility  Difficulty in walking, not elsewhere classified  Localized edema     Problem List Patient Active Problem List   Diagnosis Date Noted  . Primary osteoarthritis of right knee 12/19/2017  . S/P total knee arthroplasty, right 12/19/2017    Bess Harvest, PTA 02/20/18 12:42 PM    [facility] [address]  [place], [place], [address] Phone: [phone]   Fax:  [phone]  Name: Willie Parker MRN: [id number] Date of Birth: 07-19-1955

## 2018-02-27 ENCOUNTER — Encounter: Payer: Self-pay | Admitting: Physical Therapy

## 2018-02-27 ENCOUNTER — Ambulatory Visit: Payer: Medicaid Other | Admitting: Physical Therapy

## 2018-02-27 DIAGNOSIS — R600 Localized edema: Secondary | ICD-10-CM

## 2018-02-27 DIAGNOSIS — R2689 Other abnormalities of gait and mobility: Secondary | ICD-10-CM

## 2018-02-27 DIAGNOSIS — R262 Difficulty in walking, not elsewhere classified: Secondary | ICD-10-CM

## 2018-02-27 DIAGNOSIS — M25561 Pain in right knee: Secondary | ICD-10-CM | POA: Diagnosis not present

## 2018-02-27 DIAGNOSIS — M6281 Muscle weakness (generalized): Secondary | ICD-10-CM

## 2018-02-27 DIAGNOSIS — M25661 Stiffness of right knee, not elsewhere classified: Secondary | ICD-10-CM

## 2018-02-27 NOTE — Therapy (Signed)
[facility] [address]  [place], [place], [address] Phone: [phone]   Fax:  [phone]  Physical Therapy Treatment / Discharge Summary  Patient Details  Name: Willie Parker MRN: [id number] Date of Birth: 02/20/1956 Referring Provider (PT): Azucena Cecil, MD   Encounter Date: 02/27/2018  PT End of Session - 02/27/18 1110    Visit Number  12    Number of Visits  13    Date for PT Re-Evaluation  02/28/18    Authorization Type  Medicaid    Authorization Time Period  01/18/18 - 02/28/18    Authorization - Visit Number  11    Authorization - Number of Visits  12    PT Start Time  1110    PT Stop Time  1146    PT Time Calculation (min)  36 min    Activity Tolerance  Patient tolerated treatment well    Behavior During Therapy  [facility] for tasks assessed/performed       Past Medical History:  Diagnosis Date  . Arthritis   . Knee pain     Past Surgical History:  Procedure Laterality Date  . MANDIBLE FRACTURE SURGERY     when he was 62 year old  . TOTAL KNEE ARTHROPLASTY Right 12/19/2017   Procedure: RIGHT TOTAL KNEE ARTHROPLASTY;  Surgeon: Leandrew Koyanagi, MD;  Location: [facility];  Service: Orthopedics;  Laterality: Right;    There were no vitals filed for this visit.  Subjective Assessment - 02/27/18 1115    Subjective  Pt noting increased stiffness today, but still feels ready to transition to HEP.    Pertinent History  R TKR 12/19/17    Limitations  Sitting    How long can you sit comfortably?  5-6 minutes    How long can you stand comfortably?  not limited    How long can you walk comfortably?  not limited    Patient Stated Goals  "knee back to 90%"    Currently in Pain?  Yes    Pain Score  3     Pain Location  Knee    Pain Orientation  Right    Pain Descriptors / Indicators  Tightness   "stiffness"   Pain Type  Surgical pain;Acute pain         [facility] PT Assessment - 02/27/18 1110      Assessment   Medical Diagnosis  R TKR    Referring Provider (PT)  N. Eduard Roux, MD    Onset Date/Surgical Date  12/19/17    Next MD Visit  03/14/18      AROM   Right Knee Extension  2    Right Knee Flexion  118      PROM   Right Knee Extension  0    Right Knee Flexion  121      Strength   Right Hip Flexion  4+/5    Right Hip Extension  4+/5    Right Hip ABduction  5/5    Right Hip ADduction  4+/5    Left Hip Flexion  5/5    Left Hip Extension  4+/5    Left Hip ABduction  5/5    Left Hip ADduction  5/5    Right Knee Flexion  4+/5    Right Knee Extension  5/5    Left Knee Flexion  5/5    Left Knee Extension  5/5      Ambulation/Gait  Ambulation/Gait Assistance  7: Independent    Gait Pattern  Within Functional Limits    Stairs Assistance  6: Modified independent (Device/Increase time)    Stair Management Technique  One rail Right;Alternating pattern    Number of Stairs  14    Height of Stairs  7                   [facility] Adult PT Treatment/Exercise - 02/27/18 1110      Exercises   Exercises  Knee/Hip      Knee/Hip Exercises: Aerobic   Recumbent Bike  L3 x 7 min      Knee/Hip Exercises: Standing   Terminal Knee Extension  Right;20 reps;Theraband    Theraband Level (Terminal Knee Extension)  --   Black TB   Terminal Knee Extension Limitations  R leg slightly staggered behind L - emphasis on TKE + hip extension      Knee/Hip Exercises: Supine   Bridges  Both;15 reps    Bridges Limitations  + alt knee extension             PT Education - 02/27/18 1145    Education Details  HEP review with emphasis on hip & knee extension ROM/strengthening (black band provided for TKE & alt knee extension added to bridge)       PT Short Term Goals - 01/30/18 1104      PT SHORT TERM GOAL #1   Title  Independent with initial HEP    Status  Achieved        PT Long Term Goals - 02/27/18 1117      PT LONG TERM GOAL #1   Title  Independent iwth  advanced/ongoing HEP    Status  Achieved      PT LONG TERM GOAL #2   Title  R knee AROM >/= 3-120 degrees to allow for normal gait and mobility    Status  Partially Met      PT LONG TERM GOAL #3   Title  R hip and knee strength >/= 4+/5 for improved stability    Status  Achieved      PT LONG TERM GOAL #4   Title  Pt will ambulate w/o AD with normal gait pattern w/o evidence of R LE instability    Status  Achieved      PT LONG TERM GOAL #5   Title  Pt will ascend/descend 1 flight of stairs reciprocally with good step pattern to allow for increased ease of community access    Status  Achieved            Plan - 02/27/18 1118    Clinical Impression Statement  Willie Parker has made excellent progress with PT s/p R TKR with AROM now 2-118 and PROM 0-121. R LE now nearly symmetrical to L with greatest weakness in B hip extension, therefore reinforced HEP exercises targeting hip extension. Gait pattern and stair negotiation now WNL/WFL. All goals met for this episode with exception of flexion AROM within 2 dg of goal (met for PROM). Pt feels confident with ongoing HEP, therefore will proceed with discharge from PT for this epsiode.    Rehab Potential  Good    PT Treatment/Interventions  Patient/family education;Neuromuscular re-education;Therapeutic exercise;Therapeutic activities;Functional mobility training;Gait training;Stair training;ADLs/Self Care Home Management;Electrical Stimulation;Cryotherapy;Vasopneumatic Device;Manual techniques;Passive range of motion;Dry needling;Taping;Balance training    PT Next Visit Plan  Discharge    Consulted and Agree with Plan of Care  Patient  Patient will benefit from skilled therapeutic intervention in order to improve the following deficits and impairments:  Pain, Impaired flexibility, Hypomobility, Decreased range of motion, Decreased strength, Decreased scar mobility, Increased muscle spasms, Increased fascial restricitons, Decreased mobility,  Difficulty walking, Abnormal gait, Decreased activity tolerance, Decreased balance, Postural dysfunction  Visit Diagnosis: Acute pain of right knee  Stiffness of right knee, not elsewhere classified  Muscle weakness (generalized)  Other abnormalities of gait and mobility  Difficulty in walking, not elsewhere classified  Localized edema     Problem List Patient Active Problem List   Diagnosis Date Noted  . Primary osteoarthritis of right knee 12/19/2017  . S/P total knee arthroplasty, right 12/19/2017   PHYSICAL THERAPY DISCHARGE SUMMARY  Visits from Start of Care: 12  Current functional level related to goals / functional outcomes:   Refer to above clinical impression.   Remaining deficits:   As above.   Education / Equipment:   HEP  Plan: Patient agrees to discharge.  Patient goals were mostly met. Patient is being discharged due to meeting the stated rehab goals.  ?????      Percival Spanish, PT, MPT 02/27/2018, 11:57 AM  [facility] [address]  [place], [place], [address] Phone: [phone]   Fax:  [phone]  Name: Willie Parker MRN: [id number] Date of Birth: 19-Feb-1956

## 2018-03-14 ENCOUNTER — Ambulatory Visit (INDEPENDENT_AMBULATORY_CARE_PROVIDER_SITE_OTHER): Payer: Medicaid Other | Admitting: Orthopaedic Surgery

## 2018-03-17 ENCOUNTER — Ambulatory Visit (INDEPENDENT_AMBULATORY_CARE_PROVIDER_SITE_OTHER): Payer: Medicaid Other | Admitting: Orthopaedic Surgery

## 2018-03-17 ENCOUNTER — Ambulatory Visit (INDEPENDENT_AMBULATORY_CARE_PROVIDER_SITE_OTHER): Payer: Medicaid Other | Admitting: Physician Assistant

## 2018-03-17 DIAGNOSIS — Z96651 Presence of right artificial knee joint: Secondary | ICD-10-CM

## 2018-03-17 NOTE — Progress Notes (Signed)
   Post-Op Visit Note   Patient: Willie Parker           Date of Birth: 16-Jul-1955           MRN: [id number] Visit Date: 03/17/2018 PCP: Gildardo Pounds, NP   Assessment & Plan:  Chief Complaint:  Chief Complaint  Patient presents with  . Right Knee - Routine Post Op   Visit Diagnoses:  1. S/P total knee arthroplasty, right     Plan: Patient is a pleasant 63 year old gentleman who presents to our clinic today 88 days status post right total knee replacement, date of surgery 12/19/2017.  He has been doing very well.  He has finished physical therapy and continues to work on home exercise program.  Minimal pain.  No fevers or chills.  Examination of his right knee reveals well-healed surgical incision without evidence of infection.  Mild swelling.  Range of motion 0 to 115 degrees.  Calf is soft and nontender.  Stable valgus varus stress.  He is neurovascularly intact distally.  At this point, would like for him to continue working on range of motion and strengthening exercises.  Follow-up with [place] in 3 months time for repeat evaluation and 3 view x-rays of the right knee.  Dental prophylaxis reinforced today.  Follow-Up Instructions: Return in about 3 months (around 06/16/2018).   Orders:  No orders of the defined types were placed in this encounter.  No orders of the defined types were placed in this encounter.   Imaging: No new imaging  PMFS History: Patient Active Problem List   Diagnosis Date Noted  . Primary osteoarthritis of right knee 12/19/2017  . S/P total knee arthroplasty, right 12/19/2017   Past Medical History:  Diagnosis Date  . Arthritis   . Knee pain     Family History  Problem Relation Age of Onset  . Diabetes Mother   . Hypertension Mother     Past Surgical History:  Procedure Laterality Date  . MANDIBLE FRACTURE SURGERY     when he was 63 year old  . TOTAL KNEE ARTHROPLASTY Right 12/19/2017   Procedure: RIGHT TOTAL KNEE ARTHROPLASTY;  Surgeon:  Leandrew Koyanagi, MD;  Location: [facility];  Service: Orthopedics;  Laterality: Right;   Social History   Occupational History  . Not on file  Tobacco Use  . Smoking status: Current Every Day Smoker    Packs/day: 0.25    Types: Cigarettes  . Smokeless tobacco: Never Used  Substance and Sexual Activity  . Alcohol use: Not Currently    Comment: has not had alcohol for over a month  . Drug use: Never  . Sexual activity: Not Currently

## 2018-04-25 ENCOUNTER — Encounter: Payer: Medicaid Other | Admitting: Nurse Practitioner

## 2018-06-15 ENCOUNTER — Ambulatory Visit (INDEPENDENT_AMBULATORY_CARE_PROVIDER_SITE_OTHER): Payer: Medicaid Other | Admitting: Orthopaedic Surgery

## 2018-06-16 ENCOUNTER — Ambulatory Visit (INDEPENDENT_AMBULATORY_CARE_PROVIDER_SITE_OTHER): Payer: Medicaid Other | Admitting: Orthopaedic Surgery

## 2019-06-15 ENCOUNTER — Ambulatory Visit: Payer: Medicaid Other | Attending: Internal Medicine

## 2019-06-15 DIAGNOSIS — Z23 Encounter for immunization: Secondary | ICD-10-CM

## 2019-06-15 NOTE — Progress Notes (Signed)
   Covid-19 Vaccination Clinic  Name:  Willie Parker    MRN: [id number] DOB: 10-31-1955  06/15/2019  Willie Parker was observed post Covid-19 immunization for 15 minutes without incident. He was provided with Vaccine Information Sheet and instruction to access the V-Safe system.   Willie Parker was instructed to call 911 with any severe reactions post vaccine: Marland Kitchen Difficulty breathing  . Swelling of face and throat  . A fast heartbeat  . A bad rash all over body  . Dizziness and weakness   Immunizations Administered    Name Date Dose VIS Date Route   Pfizer COVID-19 Vaccine 06/15/2019  3:58 PM 0.3 mL 02/09/2019 Intramuscular   Manufacturer: [place]   Lot: [id number]   [place]: [id number]

## 2019-07-09 ENCOUNTER — Ambulatory Visit: Payer: Medicaid Other | Attending: Internal Medicine

## 2019-07-09 DIAGNOSIS — Z23 Encounter for immunization: Secondary | ICD-10-CM

## 2019-07-09 NOTE — Progress Notes (Signed)
   Covid-19 Vaccination Clinic  Name:  Willie Parker    MRN: [id number] DOB: [birth year]  07/09/2019  [name] was observed post Covid-19 immunization for 15 minutes without incident. He was provided with Vaccine Information Sheet and instruction to access the V-Safe system.   [name] was instructed to call 911 with any severe reactions post vaccine: Marland Kitchen Difficulty breathing  . Swelling of face and throat  . A fast heartbeat  . A bad rash all over body  . Dizziness and weakness   Immunizations Administered    Name Date Dose VIS Date Route   Pfizer COVID-19 Vaccine 07/09/2019 12:46 PM 0.3 mL 04/25/2018 Intramuscular   Manufacturer: [place]   Lot: [id number]   [place]: [id number]

## 2019-07-13 IMAGING — CR DG KNEE COMPLETE 4+V*R*
4 series · 4 of 4 positions shown · non-contrast
Comparison: None available.

CLINICAL DATA: Right knee pain for 8 years. Chronic right knee
pain.

EXAM:
RIGHT KNEE - COMPLETE 4+ VIEW

[knee ap]
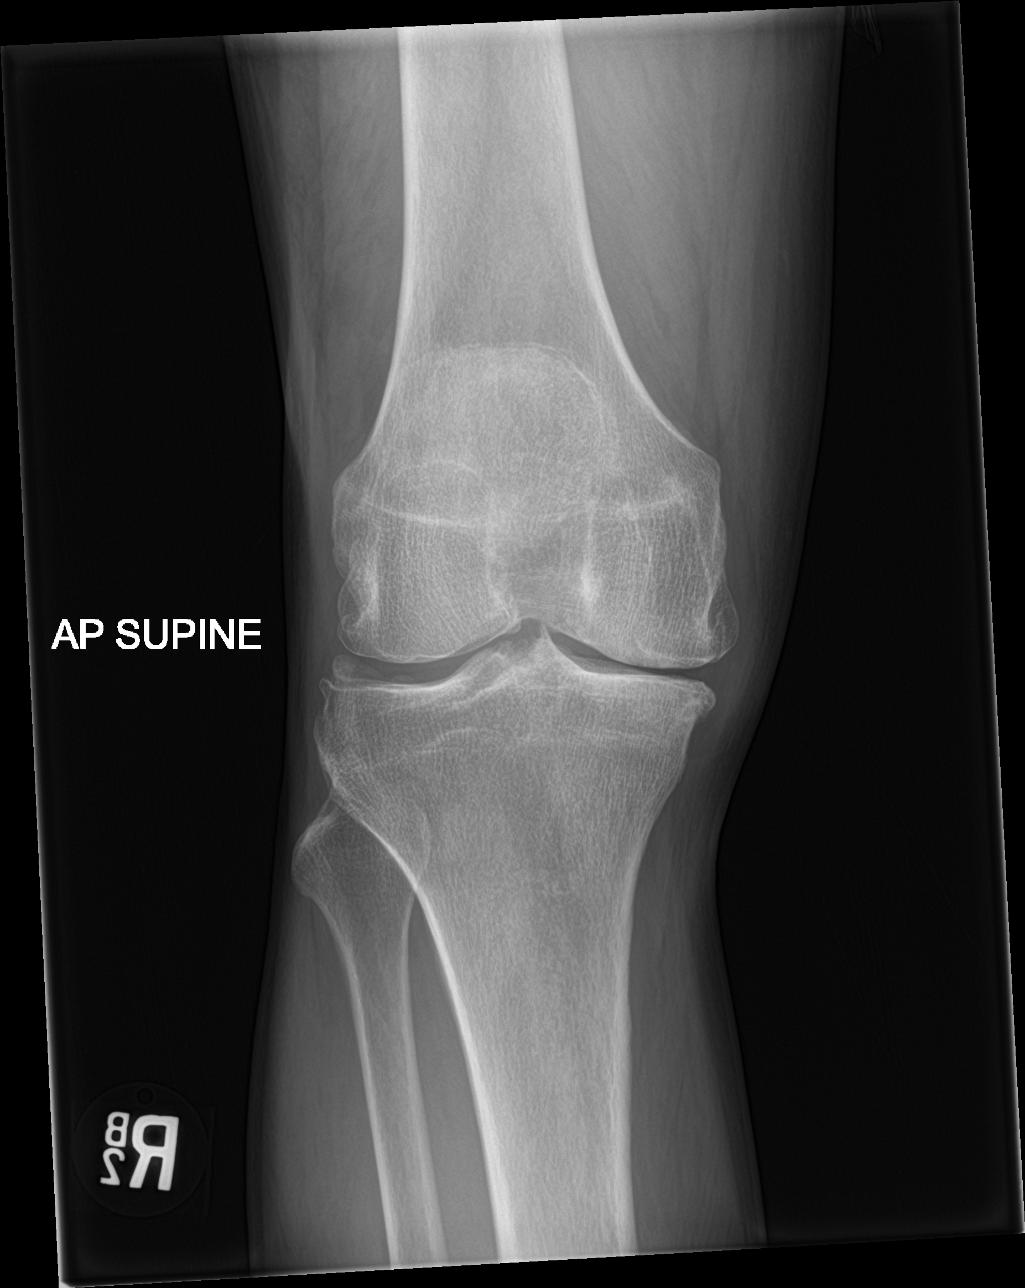

[knee lat]
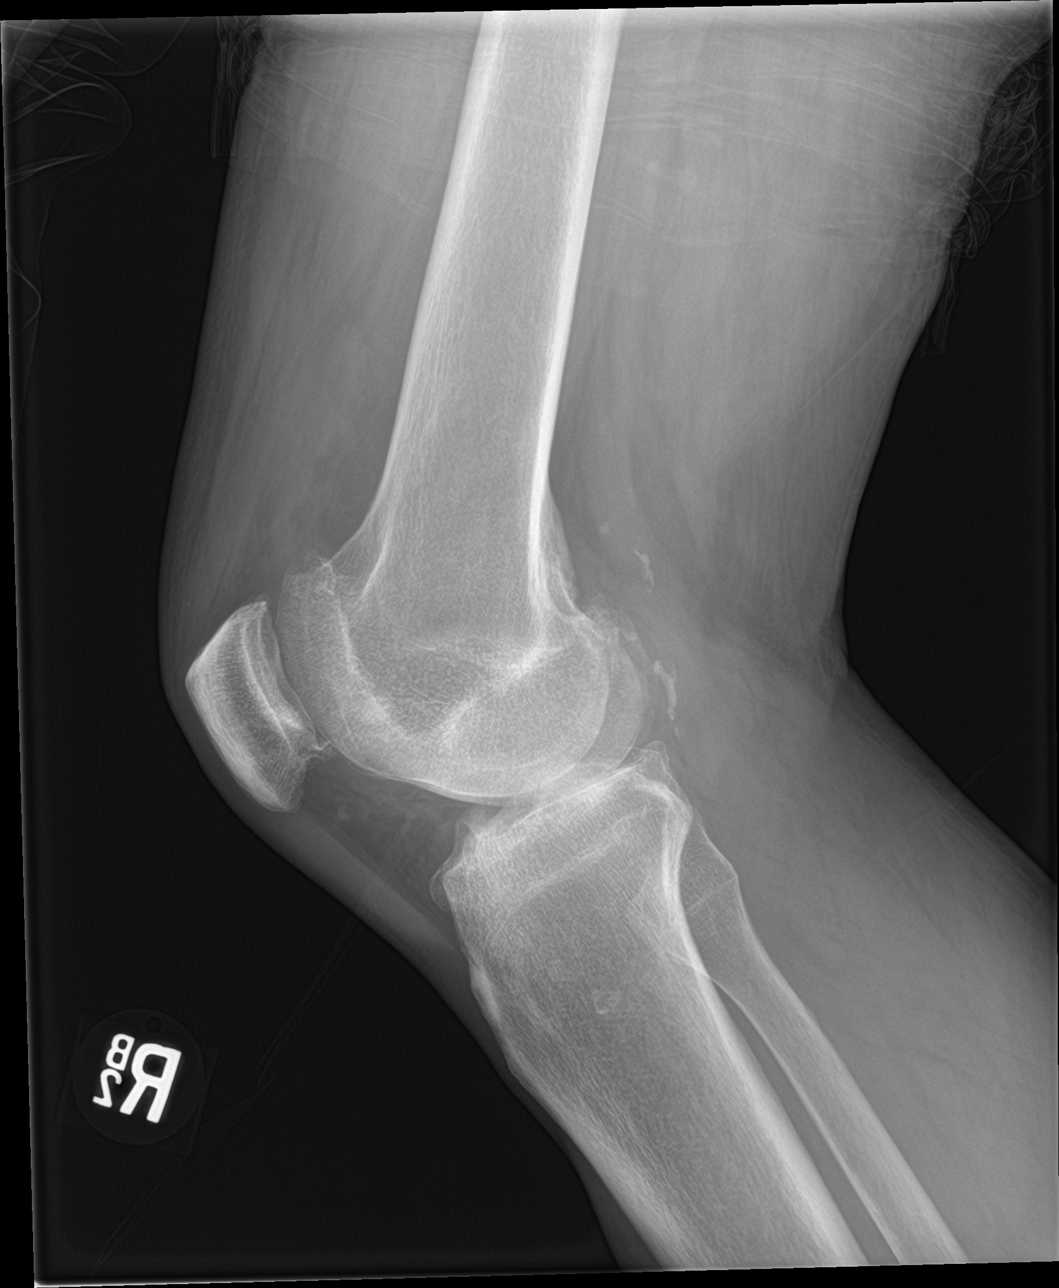

[knee obl (1 of 2)]
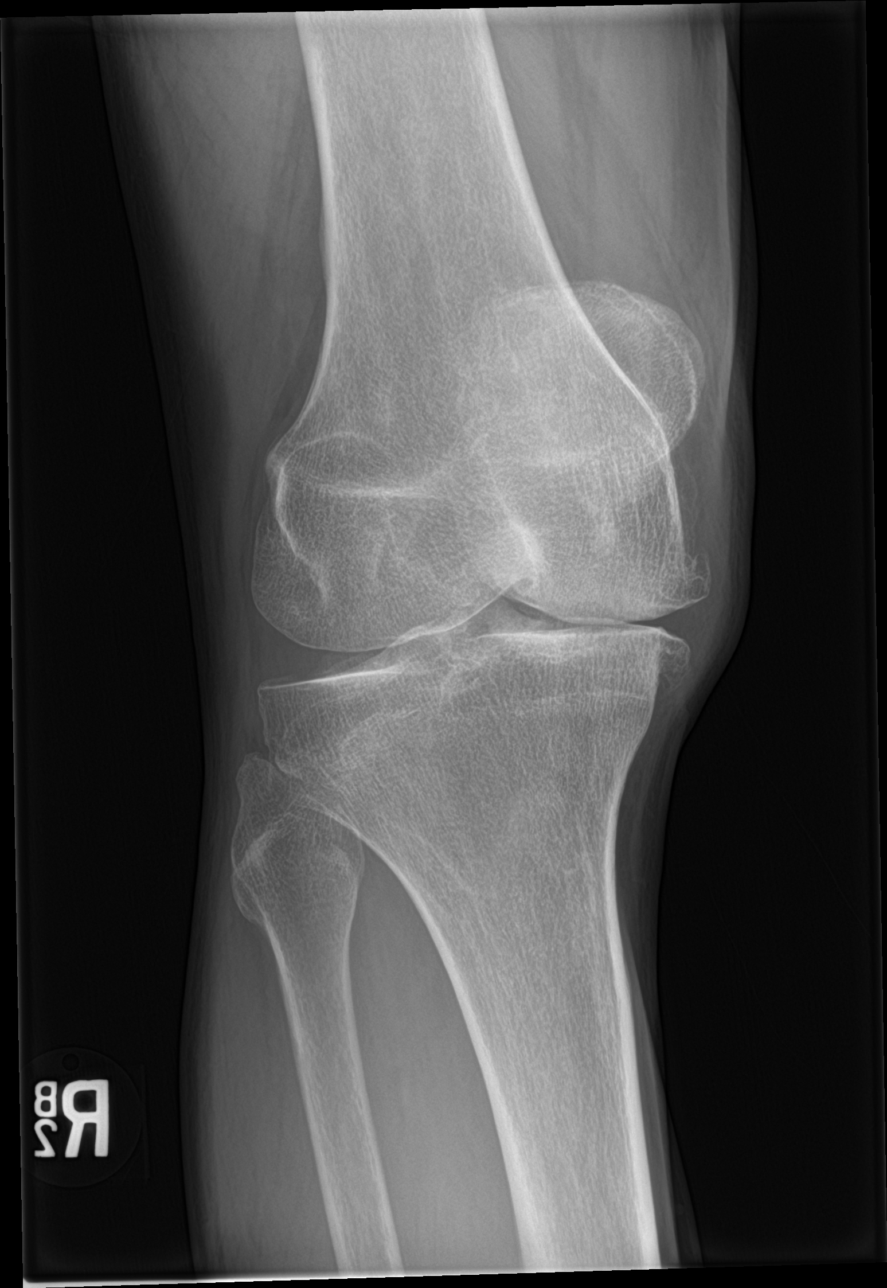

[knee obl (2 of 2)]
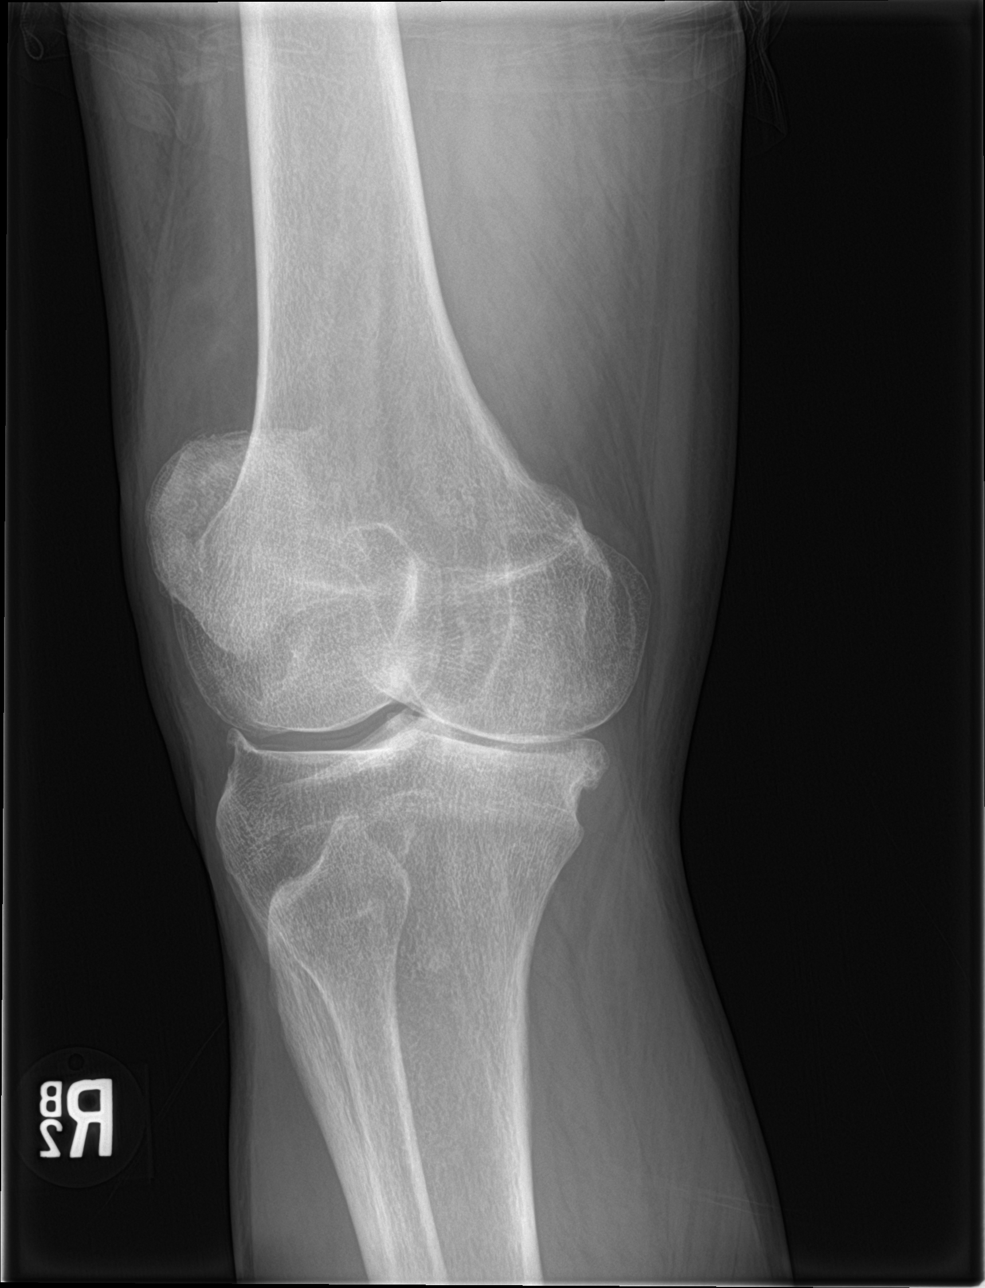

[4 of 4 positions shown; findings below may reference images not displayed]

FINDINGS: Moderate degenerative changes are noted in the medial and
patellofemoral compartments of the right knee. There is no
significant effusion. Atherosclerotic calcifications are present in
popliteal artery. No acute or healing fracture is present.
IMPRESSION: 1. Moderate degenerative changes involve the medial and
patellofemoral compartments of the knee.
2. No acute abnormality.
3. Atherosclerosis.

## 2019-11-21 IMAGING — CR DG CHEST 2V
2 series · 2 of 2 positions shown · non-contrast
Comparison: None.

CLINICAL DATA: Preop right total knee replacement.  Smoker.

EXAM:
CHEST - 2 VIEW

[w chest pa]
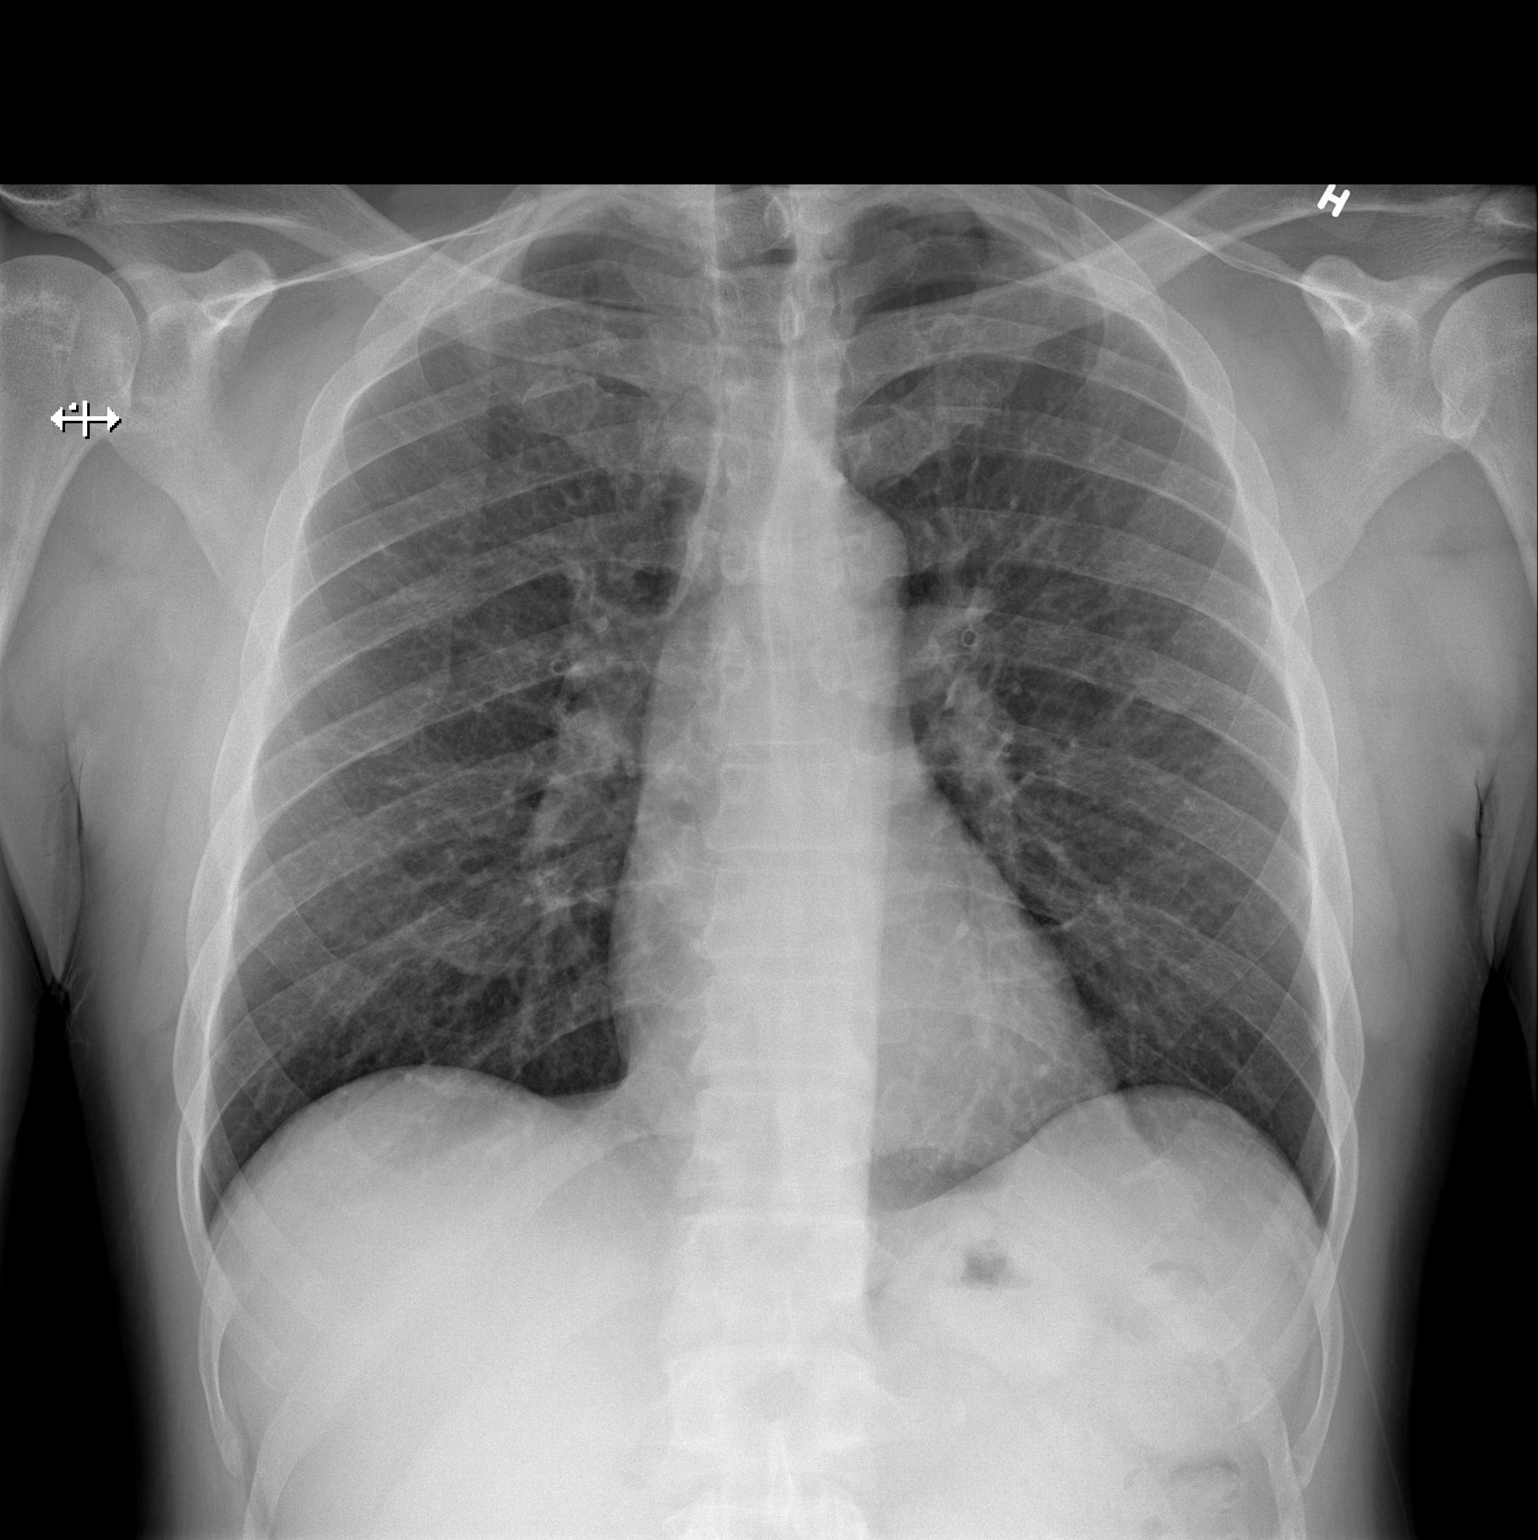

[w chest lat]
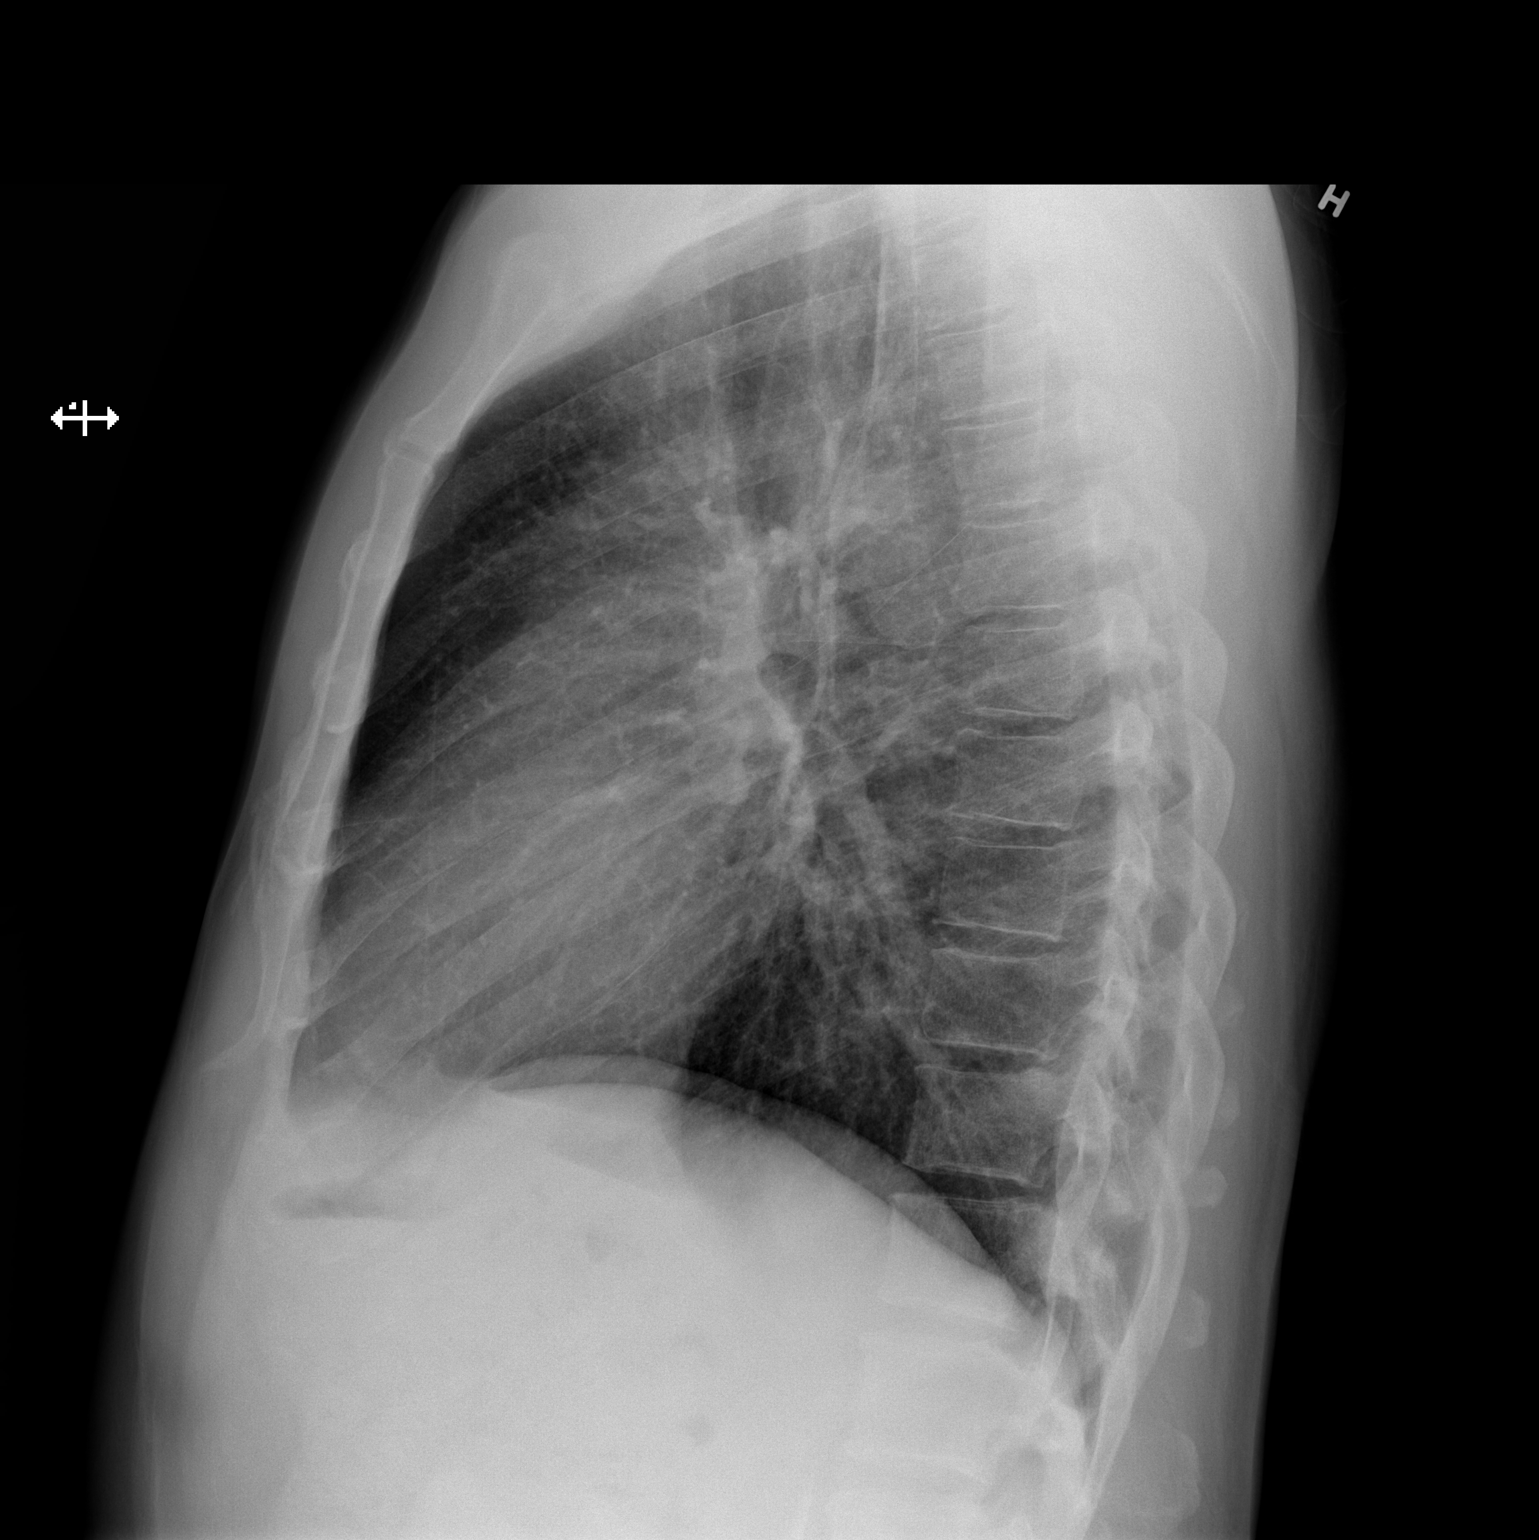

[2 of 2 positions shown; findings below may reference images not displayed]

FINDINGS: Mild peribronchial thickening. Heart and mediastinal contours are
within normal limits. No focal opacities or effusions. No acute bony
abnormality.
IMPRESSION: Mild bronchitic changes.

## 2019-11-28 IMAGING — DX DG KNEE 1-2V PORT*R*
2 series · 2 of 2 positions shown · non-contrast
Comparison: 08/03/2017.

CLINICAL DATA: 61-year-old male post total knee replacement.
Initial encounter.

EXAM:
PORTABLE RIGHT KNEE - 1-2 VIEW

[knee ap]
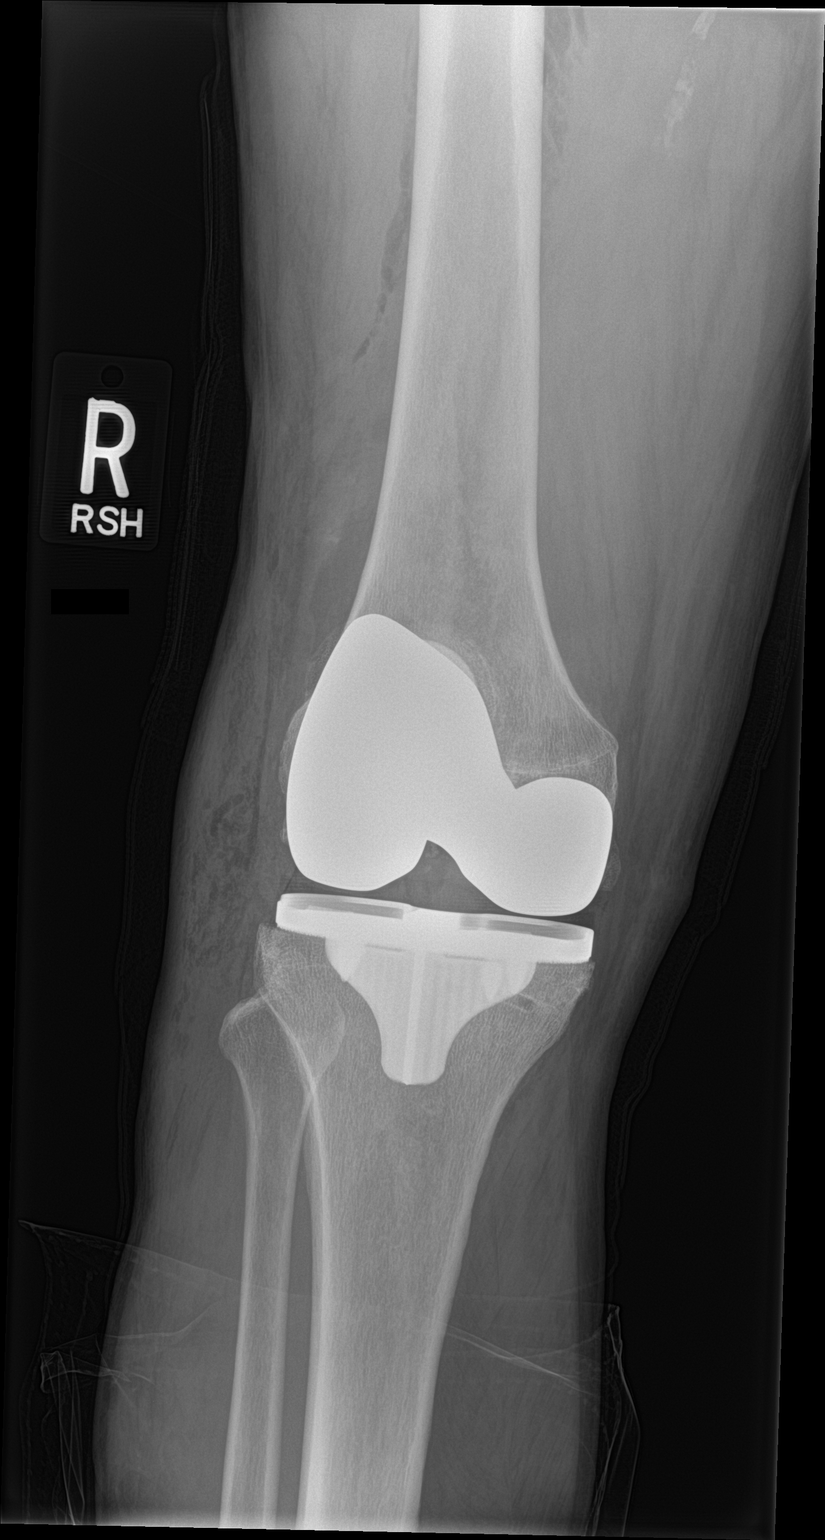

[knee lat]
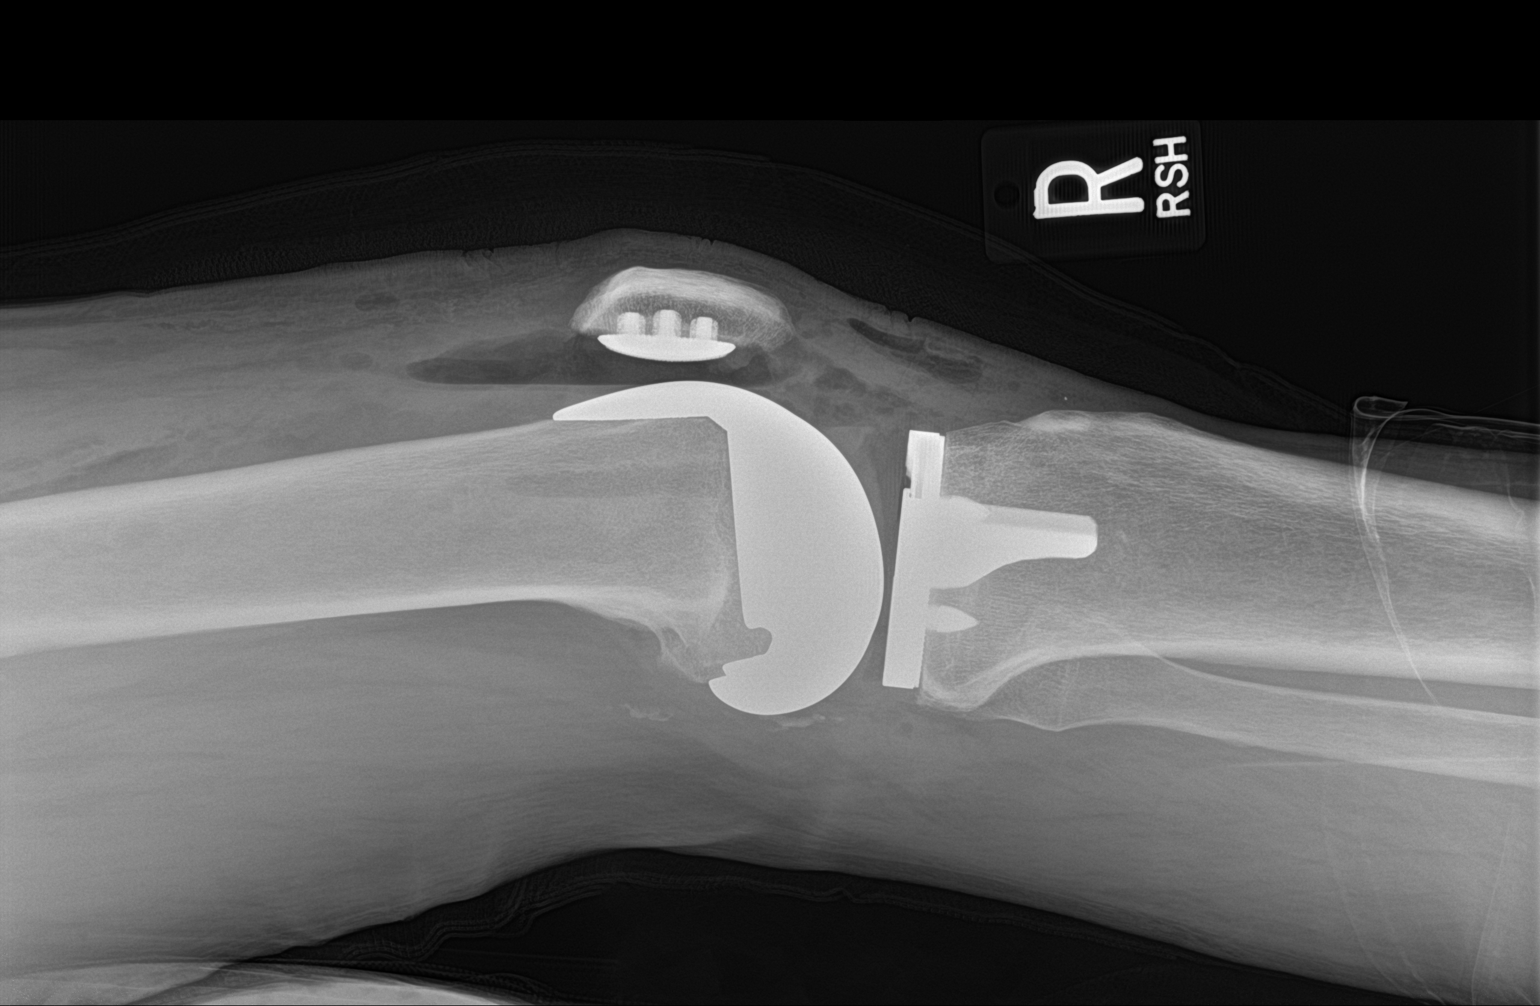

[2 of 2 positions shown; findings below may reference images not displayed]

FINDINGS: Post total right knee replacement which appears in satisfactory
position without complication noted. Lucency medial tibial plateau
may be related to procedure/prior procedure. Vascular
calcifications.
IMPRESSION: Post total right knee replacement.

## 2021-12-05 ENCOUNTER — Emergency Department (HOSPITAL_COMMUNITY): Payer: Medicare Other

## 2021-12-05 ENCOUNTER — Other Ambulatory Visit: Payer: Self-pay

## 2021-12-05 ENCOUNTER — Encounter (HOSPITAL_COMMUNITY): Payer: Self-pay

## 2021-12-05 ENCOUNTER — Inpatient Hospital Stay (HOSPITAL_COMMUNITY)
Admission: EM | Admit: 2021-12-05 | Discharge: 2021-12-12 | DRG: 375 | Disposition: A | Discharge status: Home / Self Care | Payer: Medicare Other | Attending: Internal Medicine | Admitting: Internal Medicine

## 2021-12-05 DIAGNOSIS — C787 Secondary malignant neoplasm of liver and intrahepatic bile duct: Secondary | ICD-10-CM

## 2021-12-05 DIAGNOSIS — Z681 Body mass index (BMI) 19 or less, adult: Secondary | ICD-10-CM | POA: Diagnosis not present

## 2021-12-05 DIAGNOSIS — F1410 Cocaine abuse, uncomplicated: Secondary | ICD-10-CM | POA: Diagnosis present

## 2021-12-05 DIAGNOSIS — E871 Hypo-osmolality and hyponatremia: Secondary | ICD-10-CM | POA: Diagnosis present

## 2021-12-05 DIAGNOSIS — K649 Unspecified hemorrhoids: Secondary | ICD-10-CM | POA: Diagnosis not present

## 2021-12-05 DIAGNOSIS — F1010 Alcohol abuse, uncomplicated: Secondary | ICD-10-CM

## 2021-12-05 DIAGNOSIS — F419 Anxiety disorder, unspecified: Secondary | ICD-10-CM | POA: Diagnosis present

## 2021-12-05 DIAGNOSIS — K920 Hematemesis: Secondary | ICD-10-CM

## 2021-12-05 DIAGNOSIS — Z7982 Long term (current) use of aspirin: Secondary | ICD-10-CM

## 2021-12-05 DIAGNOSIS — Z23 Encounter for immunization: Secondary | ICD-10-CM

## 2021-12-05 DIAGNOSIS — E440 Moderate protein-calorie malnutrition: Secondary | ICD-10-CM | POA: Insufficient documentation

## 2021-12-05 DIAGNOSIS — R634 Abnormal weight loss: Secondary | ICD-10-CM

## 2021-12-05 DIAGNOSIS — D62 Acute posthemorrhagic anemia: Secondary | ICD-10-CM

## 2021-12-05 DIAGNOSIS — I959 Hypotension, unspecified: Secondary | ICD-10-CM | POA: Diagnosis present

## 2021-12-05 DIAGNOSIS — K769 Liver disease, unspecified: Secondary | ICD-10-CM

## 2021-12-05 DIAGNOSIS — F17210 Nicotine dependence, cigarettes, uncomplicated: Secondary | ICD-10-CM | POA: Diagnosis present

## 2021-12-05 DIAGNOSIS — Z833 Family history of diabetes mellitus: Secondary | ICD-10-CM | POA: Diagnosis not present

## 2021-12-05 DIAGNOSIS — C169 Malignant neoplasm of stomach, unspecified: Secondary | ICD-10-CM | POA: Diagnosis not present

## 2021-12-05 DIAGNOSIS — Z8249 Family history of ischemic heart disease and other diseases of the circulatory system: Secondary | ICD-10-CM | POA: Diagnosis not present

## 2021-12-05 DIAGNOSIS — C799 Secondary malignant neoplasm of unspecified site: Secondary | ICD-10-CM | POA: Diagnosis not present

## 2021-12-05 DIAGNOSIS — C801 Malignant (primary) neoplasm, unspecified: Secondary | ICD-10-CM | POA: Diagnosis not present

## 2021-12-05 DIAGNOSIS — C7951 Secondary malignant neoplasm of bone: Secondary | ICD-10-CM | POA: Diagnosis present

## 2021-12-05 DIAGNOSIS — D72829 Elevated white blood cell count, unspecified: Secondary | ICD-10-CM | POA: Diagnosis present

## 2021-12-05 DIAGNOSIS — R627 Adult failure to thrive: Secondary | ICD-10-CM | POA: Diagnosis present

## 2021-12-05 DIAGNOSIS — K5730 Diverticulosis of large intestine without perforation or abscess without bleeding: Secondary | ICD-10-CM | POA: Diagnosis not present

## 2021-12-05 DIAGNOSIS — Z96651 Presence of right artificial knee joint: Secondary | ICD-10-CM | POA: Diagnosis present

## 2021-12-05 DIAGNOSIS — K635 Polyp of colon: Secondary | ICD-10-CM | POA: Diagnosis not present

## 2021-12-05 DIAGNOSIS — K2970 Gastritis, unspecified, without bleeding: Secondary | ICD-10-CM | POA: Diagnosis not present

## 2021-12-05 DIAGNOSIS — Z720 Tobacco use: Secondary | ICD-10-CM | POA: Diagnosis not present

## 2021-12-05 DIAGNOSIS — F1020 Alcohol dependence, uncomplicated: Secondary | ICD-10-CM | POA: Diagnosis present

## 2021-12-05 DIAGNOSIS — R7989 Other specified abnormal findings of blood chemistry: Secondary | ICD-10-CM | POA: Diagnosis not present

## 2021-12-05 DIAGNOSIS — Z79899 Other long term (current) drug therapy: Secondary | ICD-10-CM

## 2021-12-05 DIAGNOSIS — K5900 Constipation, unspecified: Secondary | ICD-10-CM | POA: Diagnosis present

## 2021-12-05 DIAGNOSIS — E869 Volume depletion, unspecified: Secondary | ICD-10-CM | POA: Diagnosis present

## 2021-12-05 DIAGNOSIS — R1084 Generalized abdominal pain: Principal | ICD-10-CM

## 2021-12-05 DIAGNOSIS — K259 Gastric ulcer, unspecified as acute or chronic, without hemorrhage or perforation: Secondary | ICD-10-CM | POA: Diagnosis not present

## 2021-12-05 DIAGNOSIS — C163 Malignant neoplasm of pyloric antrum: Principal | ICD-10-CM | POA: Diagnosis present

## 2021-12-05 DIAGNOSIS — R112 Nausea with vomiting, unspecified: Secondary | ICD-10-CM

## 2021-12-05 HISTORY — DX: Peptic ulcer, site unspecified, unspecified as acute or chronic, without hemorrhage or perforation: K27.9

## 2021-12-05 LAB — CBC WITH DIFFERENTIAL/PLATELET
Abs Immature Granulocytes: 0.09 10*3/uL — ABNORMAL HIGH (ref 0.00–0.07)
Basophils Absolute: 0 10*3/uL (ref 0.0–0.1)
Basophils Relative: 0 %
Eosinophils Absolute: 0 10*3/uL (ref 0.0–0.5)
Eosinophils Relative: 0 %
HCT: 35.5 % — ABNORMAL LOW (ref 39.0–52.0)
Hemoglobin: 12.1 g/dL — ABNORMAL LOW (ref 13.0–17.0)
Immature Granulocytes: 1 %
Lymphocytes Relative: 22 %
Lymphs Abs: 2.8 10*3/uL (ref 0.7–4.0)
MCH: 29.2 pg (ref 26.0–34.0)
MCHC: 34.1 g/dL (ref 30.0–36.0)
MCV: 85.5 fL (ref 80.0–100.0)
Monocytes Absolute: 1 10*3/uL (ref 0.1–1.0)
Monocytes Relative: 8 %
Neutro Abs: 8.9 10*3/uL — ABNORMAL HIGH (ref 1.7–7.7)
Neutrophils Relative %: 69 %
Platelets: 198 10*3/uL (ref 150–400)
RBC: 4.15 MIL/uL — ABNORMAL LOW (ref 4.22–5.81)
RDW: 12.7 % (ref 11.5–15.5)
WBC: 12.8 10*3/uL — ABNORMAL HIGH (ref 4.0–10.5)
nRBC: 0 % (ref 0.0–0.2)

## 2021-12-05 LAB — URINALYSIS, ROUTINE W REFLEX MICROSCOPIC
Bilirubin Urine: NEGATIVE
Glucose, UA: NEGATIVE mg/dL
Hgb urine dipstick: NEGATIVE
Ketones, ur: NEGATIVE mg/dL
Leukocytes,Ua: NEGATIVE
Nitrite: NEGATIVE
Protein, ur: NEGATIVE mg/dL
Specific Gravity, Urine: 1.015 (ref 1.005–1.030)
pH: 6 (ref 5.0–8.0)

## 2021-12-05 LAB — COMPREHENSIVE METABOLIC PANEL
ALT: 114 U/L — ABNORMAL HIGH (ref 0–44)
AST: 189 U/L — ABNORMAL HIGH (ref 15–41)
Albumin: 3.7 g/dL (ref 3.5–5.0)
Alkaline Phosphatase: 175 U/L — ABNORMAL HIGH (ref 38–126)
Anion gap: 8 (ref 5–15)
BUN: 24 mg/dL — ABNORMAL HIGH (ref 8–23)
CO2: 27 mmol/L (ref 22–32)
Calcium: 10.6 mg/dL — ABNORMAL HIGH (ref 8.9–10.3)
Chloride: 94 mmol/L — ABNORMAL LOW (ref 98–111)
Creatinine, Ser: 0.91 mg/dL (ref 0.61–1.24)
GFR, Estimated: >60 mL/min (ref >60)
Glucose, Bld: 101 mg/dL — ABNORMAL HIGH (ref 70–99)
Potassium: 4.1 mmol/L (ref 3.5–5.1)
Sodium: 129 mmol/L — ABNORMAL LOW (ref 135–145)
Total Bilirubin: 0.5 mg/dL (ref 0.3–1.2)
Total Protein: 7.4 g/dL (ref 6.5–8.1)

## 2021-12-05 LAB — RAPID URINE DRUG SCREEN, HOSP PERFORMED
Amphetamines: NOT DETECTED
Barbiturates: NOT DETECTED
Benzodiazepines: NOT DETECTED
Cocaine: POSITIVE — AB
Opiates: POSITIVE — AB
Tetrahydrocannabinol: NOT DETECTED

## 2021-12-05 LAB — HEMOGLOBIN AND HEMATOCRIT, BLOOD
HCT: 27.6 % — ABNORMAL LOW (ref 39.0–52.0)
Hemoglobin: 9.4 g/dL — ABNORMAL LOW (ref 13.0–17.0)

## 2021-12-05 LAB — ETHANOL: Alcohol, Ethyl (B): <10 mg/dL (ref <10)

## 2021-12-05 LAB — PROTIME-INR
INR: 1 (ref 0.8–1.2)
Prothrombin Time: 12.7 seconds (ref 11.4–15.2)

## 2021-12-05 LAB — TYPE AND SCREEN
ABO/RH(D): O POS
Antibody Screen: NEGATIVE

## 2021-12-05 LAB — LIPASE, BLOOD: Lipase: 29 U/L (ref 11–51)

## 2021-12-05 LAB — LACTIC ACID, PLASMA: Lactic Acid, Venous: 1.9 mmol/L (ref 0.5–1.9)

## 2021-12-05 MED ORDER — ACETAMINOPHEN 650 MG RE SUPP: 650.0000 mg | Freq: Four times a day (QID) | RECTAL | Status: DC | PRN

## 2021-12-05 MED ORDER — LORAZEPAM 2 MG/ML IJ SOLN: 0.0000 mg | Freq: Four times a day (QID) | INTRAMUSCULAR | Status: AC

## 2021-12-05 MED ORDER — LORAZEPAM 2 MG/ML IJ SOLN: 0.0000 mg | Freq: Four times a day (QID) | INTRAMUSCULAR | Status: DC

## 2021-12-05 MED ORDER — LORAZEPAM 2 MG/ML IJ SOLN: 1.0000 mg | INTRAMUSCULAR | Status: AC | PRN

## 2021-12-05 MED ORDER — LORAZEPAM 2 MG/ML IJ SOLN: 0.0000 mg | Freq: Two times a day (BID) | INTRAMUSCULAR | Status: DC

## 2021-12-05 MED ORDER — FOLIC ACID 1 MG PO TABS
1.0000 mg | ORAL_TABLET | Freq: Every day | ORAL | Status: DC
  Administered 2021-12-05 – 2021-12-12 (×5): 1 mg via ORAL
  Filled 2021-12-05 (×4): qty 1

## 2021-12-05 MED ORDER — MORPHINE SULFATE (PF) 4 MG/ML IV SOLN
4.0000 mg | Freq: Once | INTRAVENOUS | Status: AC
  Administered 2021-12-05: 4 mg via INTRAVENOUS
  Filled 2021-12-05: qty 1

## 2021-12-05 MED ORDER — SODIUM CHLORIDE 0.9 % IV SOLN
12.5000 mg | Freq: Once | INTRAVENOUS | Status: AC
  Administered 2021-12-05: 12.5 mg via INTRAVENOUS
  Filled 2021-12-05: qty 12.5

## 2021-12-05 MED ORDER — IOHEXOL 300 MG/ML  SOLN
100.0000 mL | Freq: Once | INTRAMUSCULAR | Status: AC | PRN
  Administered 2021-12-05: 100 mL via INTRAVENOUS

## 2021-12-05 MED ORDER — LORAZEPAM 2 MG/ML IJ SOLN: 0.0000 mg | Freq: Two times a day (BID) | INTRAMUSCULAR | Status: AC

## 2021-12-05 MED ORDER — THIAMINE MONONITRATE 100 MG PO TABS
100.0000 mg | ORAL_TABLET | Freq: Every day | ORAL | Status: DC
  Administered 2021-12-06 – 2021-12-12 (×4): 100 mg via ORAL
  Filled 2021-12-05 (×4): qty 1

## 2021-12-05 MED ORDER — ADULT MULTIVITAMIN W/MINERALS CH
1.0000 | ORAL_TABLET | Freq: Every day | ORAL | Status: DC
  Administered 2021-12-05 – 2021-12-12 (×6): 1 via ORAL
  Filled 2021-12-05 (×5): qty 1

## 2021-12-05 MED ORDER — THIAMINE HCL 100 MG/ML IJ SOLN
100.0000 mg | Freq: Every day | INTRAMUSCULAR | Status: DC
  Administered 2021-12-05 – 2021-12-10 (×3): 100 mg via INTRAVENOUS
  Filled 2021-12-05 (×2): qty 2

## 2021-12-05 MED ORDER — ONDANSETRON HCL 4 MG/2ML IJ SOLN
4.0000 mg | Freq: Once | INTRAMUSCULAR | Status: AC
  Administered 2021-12-05: 4 mg via INTRAVENOUS
  Filled 2021-12-05: qty 2

## 2021-12-05 MED ORDER — ACETAMINOPHEN 325 MG PO TABS: 650.0000 mg | ORAL_TABLET | Freq: Four times a day (QID) | ORAL | Status: DC | PRN

## 2021-12-05 MED ORDER — SODIUM CHLORIDE 0.9 % IV BOLUS
1000.0000 mL | Freq: Once | INTRAVENOUS | Status: AC
  Administered 2021-12-05: 1000 mL via INTRAVENOUS

## 2021-12-05 MED ORDER — PANTOPRAZOLE SODIUM 40 MG IV SOLR
40.0000 mg | Freq: Two times a day (BID) | INTRAVENOUS | Status: DC
  Administered 2021-12-09 – 2021-12-10 (×3): 40 mg via INTRAVENOUS
  Filled 2021-12-05 (×3): qty 10

## 2021-12-05 MED ORDER — LORAZEPAM 1 MG PO TABS: 1.0000 mg | ORAL_TABLET | ORAL | Status: AC | PRN

## 2021-12-05 MED ORDER — PANTOPRAZOLE INFUSION (NEW) - SIMPLE MED
8.0000 mg/h | INTRAVENOUS | Status: DC
  Administered 2021-12-05 – 2021-12-08 (×6): 8 mg/h via INTRAVENOUS
  Filled 2021-12-05 (×4): qty 100
  Filled 2021-12-05 (×2): qty 80
  Filled 2021-12-05 (×2): qty 100
  Filled 2021-12-05 (×2): qty 80

## 2021-12-05 MED ORDER — PANTOPRAZOLE SODIUM 40 MG IV SOLR: 40.0000 mg | Freq: Once | INTRAVENOUS | Status: DC

## 2021-12-05 MED ORDER — ONDANSETRON HCL 4 MG/2ML IJ SOLN
4.0000 mg | Freq: Four times a day (QID) | INTRAMUSCULAR | Status: DC | PRN
  Administered 2021-12-12: 4 mg via INTRAVENOUS
  Filled 2021-12-05: qty 2

## 2021-12-05 MED ORDER — MAGNESIUM SULFATE 2 GM/50ML IV SOLN
2.0000 g | Freq: Once | INTRAVENOUS | Status: AC
  Administered 2021-12-05: 2 g via INTRAVENOUS
  Filled 2021-12-05: qty 50

## 2021-12-05 MED ORDER — PANTOPRAZOLE SODIUM 40 MG IV SOLR
40.0000 mg | Freq: Once | INTRAVENOUS | Status: AC
  Administered 2021-12-05: 40 mg via INTRAVENOUS
  Filled 2021-12-05: qty 10

## 2021-12-05 MED ORDER — MORPHINE SULFATE (PF) 4 MG/ML IV SOLN
4.0000 mg | INTRAVENOUS | Status: DC | PRN
  Administered 2021-12-05 – 2021-12-10 (×13): 4 mg via INTRAVENOUS
  Filled 2021-12-05 (×13): qty 1

## 2021-12-05 MED ORDER — ONDANSETRON HCL 4 MG PO TABS: 4.0000 mg | ORAL_TABLET | Freq: Four times a day (QID) | ORAL | Status: DC | PRN

## 2021-12-05 MED ORDER — SODIUM CHLORIDE 0.9 % IV SOLN: INTRAVENOUS | Status: DC

## 2021-12-05 NOTE — ED Notes (Signed)
Pt has a urinal at bedside 

## 2021-12-05 NOTE — ED Notes (Signed)
Advised RN of B/P 93/58 seems to be running within those numbers

## 2021-12-05 NOTE — ED Provider Notes (Signed)
[facility] DEPT Provider Note   CSN: [id number] Arrival date & time: 12/05/21  1117     History  No chief complaint on file.   Willie Parker is a 66 y.o. male.  He has no significant past medical history.  He is complaining of a month or 2 of generalized abdominal pain associated with nausea and vomiting.  He said he has had 40 pounds of unintentional weight loss.  He also endorses constipation.  He said he seen his primary care doctor and had COVID testing, lab work chest x-ray and was put on antibiotics for 5 days.  He is not sure of the diagnosis.  He does not feel any better.  Also said he is generally fatigued and feels short of breath with any type of exertion.  He smokes cigarettes and drinks alcohol.  He thinks there may be a small amount of blood in the vomitus.  The history is provided by the patient.  Abdominal Pain Pain location:  Generalized Pain quality: aching   Pain severity:  Severe Onset quality:  Gradual Duration:  4 weeks Timing:  Constant Progression:  Unchanged Chronicity:  New Context: alcohol use   Context: not trauma   Relieved by:  Nothing Worsened by:  Nothing Ineffective treatments:  None tried Associated symptoms: constipation, fatigue, nausea, shortness of breath and vomiting   Associated symptoms: no chest pain, no cough, no diarrhea, no dysuria, no fever and no sore throat        Home Medications Prior to Admission medications   Medication Sig Start Date End Date Taking? Authorizing Provider  aspirin EC 81 MG tablet Take 1 tablet (81 mg total) by mouth 2 (two) times daily. 12/19/17   Leandrew Koyanagi, MD  HYDROcodone-acetaminophen (NORCO) 5-325 MG tablet Take 1-2 tablets by mouth 3 (three) times daily as needed for moderate pain. 01/10/18   Aundra Dubin, PA-C  methocarbamol (ROBAXIN) 750 MG tablet Take 1 tablet (750 mg total) by mouth 2 (two) times daily as needed for muscle spasms. 12/19/17   Leandrew Koyanagi, MD   ondansetron (ZOFRAN) 4 MG tablet Take 1-2 tablets (4-8 mg total) by mouth every 8 (eight) hours as needed for nausea or vomiting. 12/19/17   Leandrew Koyanagi, MD  oxyCODONE (OXY IR/ROXICODONE) 5 MG immediate release tablet Take 1-3 tablets (5-15 mg total) by mouth every 6 (six) hours as needed. 12/28/17   Suzan Slick, NP  promethazine (PHENERGAN) 25 MG tablet Take 1 tablet (25 mg total) by mouth every 6 (six) hours as needed for nausea. 12/19/17   Leandrew Koyanagi, MD  senna-docusate (SENOKOT S) 8.6-50 MG tablet Take 1 tablet by mouth at bedtime as needed. 12/19/17   Leandrew Koyanagi, MD  trimethoprim-polymyxin b (POLYTRIM) ophthalmic solution Place 1 drop into both eyes every 4 (four) hours. Patient taking differently: Place 1 drop into both eyes 4 (four) times daily.  11/24/17   Virgel Manifold, MD      Allergies    Patient has no known allergies.    Review of Systems   Review of Systems  Constitutional:  Positive for fatigue. Negative for fever.  HENT:  Negative for sore throat.   Eyes:  Negative for visual disturbance.  Respiratory:  Positive for shortness of breath. Negative for cough.   Cardiovascular:  Negative for chest pain.  Gastrointestinal:  Positive for abdominal pain, constipation, nausea and vomiting. Negative for diarrhea.  Genitourinary:  Negative for dysuria.  Musculoskeletal:  Negative  for gait problem.  Skin:  Negative for rash.  Neurological:  Negative for headaches.    Physical Exam Updated Vital Signs BP 112/61 (BP Location: Right Arm)   Pulse 78   Temp (!) 97.4 F (36.3 C)   Resp 18   SpO2 100%  Physical Exam Vitals and nursing note reviewed.  Constitutional:      General: He is not in acute distress.    Appearance: Normal appearance. He is well-developed.  HENT:     Head: Normocephalic and atraumatic.  Eyes:     Conjunctiva/sclera: Conjunctivae normal.  Cardiovascular:     Rate and Rhythm: Normal rate and regular rhythm.     Pulses: Normal pulses.      Heart sounds: No murmur heard. Pulmonary:     Effort: Pulmonary effort is normal. No respiratory distress.     Breath sounds: Normal breath sounds.  Abdominal:     Palpations: Abdomen is soft. There is no mass.     Tenderness: There is abdominal tenderness. There is no guarding or rebound.  Musculoskeletal:        General: Normal range of motion.     Cervical back: Neck supple.     Right lower leg: No edema.     Left lower leg: No edema.  Skin:    General: Skin is warm and dry.     Capillary Refill: Capillary refill takes less than 2 seconds.  Neurological:     General: No focal deficit present.     Mental Status: He is alert.     ED Results / Procedures / Treatments   Labs (all labs ordered are listed, but only abnormal results are displayed) Labs Reviewed  COMPREHENSIVE METABOLIC PANEL - Abnormal; Notable for the following components:      Result Value   Sodium 129 (*)    Chloride 94 (*)    Glucose, Bld 101 (*)    BUN 24 (*)    Calcium 10.6 (*)    AST 189 (*)    ALT 114 (*)    Alkaline Phosphatase 175 (*)    All other components within normal limits  CBC WITH DIFFERENTIAL/PLATELET - Abnormal; Notable for the following components:   WBC 12.8 (*)    RBC 4.15 (*)    Hemoglobin 12.1 (*)    HCT 35.5 (*)    Neutro Abs 8.9 (*)    Abs Immature Granulocytes 0.09 (*)    All other components within normal limits  RAPID URINE DRUG SCREEN, HOSP PERFORMED - Abnormal; Notable for the following components:   Opiates POSITIVE (*)    Cocaine POSITIVE (*)    All other components within normal limits  LIPASE, BLOOD  LACTIC ACID, PLASMA  URINALYSIS, ROUTINE W REFLEX MICROSCOPIC  ETHANOL  PROTIME-INR  LACTIC ACID, PLASMA  HEMOGLOBIN AND HEMATOCRIT, BLOOD  HEMOGLOBIN AND HEMATOCRIT, BLOOD  MAGNESIUM  PHOSPHORUS  TYPE AND SCREEN    EKG EKG Interpretation  Date/Time:  Saturday December 05 2021 11:37:40 EDT Ventricular Rate:  75 PR Interval:  189 QRS Duration: 90 QT  Interval:  367 QTC Calculation: 410 R Axis:   63 Text Interpretation: Sinus rhythm Atrial premature complexes in couplets Probable anteroseptal infarct, old Confirmed by Aletta Edouard [phone]) on 12/05/2021 4:49:33 PM  Radiology CT Abdomen Pelvis W Contrast  Result Date: 12/05/2021 CLINICAL DATA:  Nausea vomiting.  Abdominal pain. EXAM: CT ABDOMEN AND PELVIS WITH CONTRAST TECHNIQUE: Multidetector CT imaging of the abdomen and pelvis was performed using the standard protocol  following bolus administration of intravenous contrast. RADIATION DOSE REDUCTION: This exam was performed according to the departmental dose-optimization program which includes automated exposure control, adjustment of the mA and/or kV according to patient size and/or use of iterative reconstruction technique. CONTRAST:  176m OMNIPAQUE IOHEXOL 300 MG/ML  SOLN COMPARISON:  05/30/2021. FINDINGS: Lower chest: Clear lung bases. Hepatobiliary: Numerous hypoattenuating liver masses that are new from the prior CT, largest in the right lobe, segment 7, 4.7 cm. Possible small gallstone. No acute cholecystitis. No bile duct dilation. Pancreas: Unremarkable. No pancreatic ductal dilatation or surrounding inflammatory changes. Spleen: Normal in size without focal abnormality. Adrenals/Urinary Tract: Normal adrenal glands. Kidneys normal size, orientation and position with symmetric enhancement and excretion. Mid to upper pole left renal cyst, 1.1 cm. No follow-up recommended. No other renal masses, no stones and no hydronephrosis. Normal ureters. Bladder is unremarkable. Stomach/Bowel: Stomach is mostly decompressed, otherwise unremarkable. Small bowel is normal in caliber. No wall thickening or inflammation. Stomach is mildly distended, mostly along the ascending and transverse portions, with moderate increased stool burden. No colonic wall thickening. No inflammatory changes. No evidence of a mass. Normal appendix visualized. Vascular/Lymphatic:  Aortic atherosclerosis. No aneurysm. Poorly defined enlarged porta hepatis lymph nodes, up to 1.4 cm in short axis. Reproductive: Enlarged prostate, 4.5 x 4.4 cm. Other: No ascites. Musculoskeletal: Numerous lytic bone lesions throughout the visualized spine, as well as the pelvis. No fracture. IMPRESSION: 1. Numerous liver lesions as well as numerous lytic bone lesions consistent with metastatic disease. Primary malignancy not defined on this exam. 2. No acute findings. 3. Moderate increase in the colonic stool burden. 4. Aortic atherosclerosis. Electronically Signed   By: DLajean ManesM.D.   On: 12/05/2021 14:40   DG Chest Port 1 View  Result Date: 12/05/2021 CLINICAL DATA:  Abdominal pain.  Hematemesis.  Former smoker. EXAM: PORTABLE CHEST 1 VIEW COMPARISON:  12/12/2017. FINDINGS: Cardiac silhouette normal in size. Normal mediastinal and hilar contours. Clear lungs.  No pleural effusion or pneumothorax. Skeletal structures are grossly intact. IMPRESSION: No active disease. Electronically Signed   By: DLajean ManesM.D.   On: 12/05/2021 12:36    Procedures Procedures    Medications Ordered in ED Medications  morphine (PF) 4 MG/ML injection 4 mg (4 mg Intravenous Given 12/05/21 1557)  pantoprozole (PROTONIX) 80 mg /NS 100 mL infusion (has no administration in time range)  pantoprazole (PROTONIX) injection 40 mg (has no administration in time range)  pantoprazole (PROTONIX) injection 40 mg (40 mg Intravenous Not Given 12/05/21 1556)  0.9 %  sodium chloride infusion (has no administration in time range)  acetaminophen (TYLENOL) tablet 650 mg (has no administration in time range)    Or  acetaminophen (TYLENOL) suppository 650 mg (has no administration in time range)  ondansetron (ZOFRAN) tablet 4 mg (has no administration in time range)    Or  ondansetron (ZOFRAN) injection 4 mg (has no administration in time range)  magnesium sulfate IVPB 2 g 50 mL (has no administration in time range)   LORazepam (ATIVAN) tablet 1-4 mg (has no administration in time range)    Or  LORazepam (ATIVAN) injection 1-4 mg (has no administration in time range)  thiamine (VITAMIN B1) tablet 100 mg (has no administration in time range)    Or  thiamine (VITAMIN B1) injection 100 mg (has no administration in time range)  folic acid (FOLVITE) tablet 1 mg (has no administration in time range)  multivitamin with minerals tablet 1 tablet (has no administration in time  range)  LORazepam (ATIVAN) injection 0-4 mg (has no administration in time range)    Followed by  LORazepam (ATIVAN) injection 0-4 mg (has no administration in time range)  sodium chloride 0.9 % bolus 1,000 mL (0 mLs Intravenous Stopped 12/05/21 1557)  ondansetron (ZOFRAN) injection 4 mg (4 mg Intravenous Given 12/05/21 1222)  morphine (PF) 4 MG/ML injection 4 mg (4 mg Intravenous Given 12/05/21 1222)  promethazine (PHENERGAN) 12.5 mg in sodium chloride 0.9 % 50 mL IVPB (0 mg Intravenous Stopped 12/05/21 1513)  pantoprazole (PROTONIX) injection 40 mg (40 mg Intravenous Given 12/05/21 1457)  iohexol (OMNIPAQUE) 300 MG/ML solution 100 mL (100 mLs Intravenous Contrast Given 12/05/21 1412)    ED Course/ Medical Decision Making/ A&P Clinical Course as of 12/05/21 1648  Sat Dec 05, 2021  1235 Chest x-ray interpreted by me as no acute infiltrate no pneumothorax.  Awaiting radiology reading. [MB]  3299 Patient CT showing multiple hepatic and bony lesions concerning for metastatic disease.  I reviewed this with the patient and his sister and daughter who are present.  I recommended that he get admitted to the hospital for further pain control and work-up and he is in agreement with plan.  Discussed with Dr. Olevia Bowens [facility] hospitalist who asked that I place a secure message to GI Dr. Therisa Doyne so that they can follow along. [MB]    Clinical Course User Index [MB] Hayden Rasmussen, MD                           Medical Decision Making Amount and/or Complexity  of Data Reviewed Labs: ordered. Radiology: ordered.  Risk Prescription drug management. Decision regarding hospitalization.   This patient complains of abdominal pain nausea vomiting weight loss; this involves an extensive number of treatment Options and is a complaint that carries with it a high risk of complications and morbidity. The differential includes peptic ulcer disease, biliary colic, obstruction, malignancy, metabolic derangement, renal failure  I ordered, reviewed and interpreted labs, which included CBC with mildly elevated white count hemoglobin lower than priors, chemistries concerning for low sodium, elevated LFTs, INR normal, alcohol negative, urine without signs of infection I ordered medication IV fluids and pain medication, IV PPI and reviewed PMP when indicated. I ordered imaging studies which included chest x-ray and CT abdomen and pelvis and I independently    visualized and interpreted imaging which showed multiple liver and bony lesions concerning for malignancy Additional history obtained from patient's sister and daughter Previous records obtained and reviewed in epic no recent admissions  I consulted [facility] hospitalist Dr. Olevia Bowens and GI Dr. Therisa Doyne and discussed lab and imaging findings and discussed disposition.  Cardiac monitoring reviewed, normal sinus rhythm Social determinants considered, tobacco alcohol or drug use Critical Interventions: None  After the interventions stated above, I reevaluated the patient and found patient still to be in pain although hemodynamically stable Admission and further testing considered, patient will benefit from admission for further work-up of possible malignancy.  Patient in agreement with plan.         Final Clinical Impression(s) / ED Diagnoses Final diagnoses:  Generalized abdominal pain  Nausea and vomiting, unspecified vomiting type  Liver lesion  Elevated LFTs    Rx / DC Orders ED Discharge Orders      None         Hayden Rasmussen, MD 12/05/21 [phone]

## 2021-12-05 NOTE — H&P (Signed)
History and Physical    Patient: Willie Parker TGG:[id number] DOB: 06-27-1955 DOA: 12/05/2021 DOS: the patient was seen and examined on 12/05/2021 PCP: Egbert Garibaldi, PA-C  Patient coming from: Home  Chief Complaint:  Chief Complaint  Patient presents with   Abdominal Pain   HPI: Kal Chait is a 66 y.o. male with medical history significant of osteoarthritis, knee pain, history of right TKA, mandible fracture, peptic ulcer disease, tobacco use, cocaine abuse, alcohol abuse who presented to the emergency department with complaints of abdominal pain for at least a month associated with a 40 pound weight loss, fatigue, decreased appetite, nausea, multiple episodes of emesis in the past 2 weeks, including an episode of hematemesis this morning.  The patient has also been constipated for 2 weeks as well and stated that his stools were melanotic during his last bowel movement.  He stated that he has not been drinking that much beer as he has lost the taste for it.  He has smokes three quarters of a pack of cigarettes for decades.  No fever, positive chills and night sweats. He denied sore throat, wheezing or hemoptysis but gets frequent rhinorrhea.  No chest pain, palpitations, diaphoresis, PND, orthopnea or pitting edema of the lower extremities. No flank pain, dysuria, frequency or hematuria.  No polyuria, polydipsia, polyphagia or blurred vision.   ED course: Initial vital signs were temperature 97.4 F, pulse 78, respiration 18, BP 112/61 mmHg and O2 sat 100% on room air.  The patient received ondansetron 4 mg IVP, pantoprazole 40 mg IVP, promethazine 12.5 mg IVPB and 1000 mL of normal saline bolus.  Lab work: His urinalysis was normal.  UDS was positive for opiates and cocaine.  CBC with a white count of 12.8, hemoglobin 4.1 g/dL and platelets 198.  His hemoglobin level 3 days ago was 14.0 g/dL.  Lactic acid and lipase were normal.  CMP showed sodium 129, potassium 4.1, chloride 94 and CO2 27  mmol/L.  Calcium 10.6, glucose 101, BUN 24 and creatinine 0.91 mg/dL.  Total protein 7.4 and albumin 3.7 g/dL.  AST 189, ALT 114 and alkaline phosphatase 175 units/L.  3 days ago transaminases were slightly higher and calcium was 11.4 mg/dL.  Imaging: Portable 1 view chest radiograph with no active disease.  CT abdomen/pelvis with contrast with numerous liver lesions as well as numerous lytic bone lesions of the spine as well as the pelvis consistent with metastatic disease.  Moderate increase in the colonic stool burden.  There was aortic atherosclerosis.  No acute findings.  Review of Systems: As mentioned in the history of present illness. All other systems reviewed and are negative. Past Medical History:  Diagnosis Date   Arthritis    Knee pain    Peptic ulcer    Past Surgical History:  Procedure Laterality Date   MANDIBLE FRACTURE SURGERY     when he was 66 year old   TOTAL KNEE ARTHROPLASTY Right 12/19/2017   Procedure: RIGHT TOTAL KNEE ARTHROPLASTY;  Surgeon: Leandrew Koyanagi, MD;  Location: [facility];  Service: Orthopedics;  Laterality: Right;   Social History:  reports that he has been smoking cigarettes. He has been smoking an average of .25 packs per day. He has never used smokeless tobacco. He reports current alcohol use. He reports current drug use. Drug: Cocaine.  No Known Allergies  Family History  Problem Relation Age of Onset   Diabetes Mother    Hypertension Mother    Hypertension Sister    Diabetes Sister  Prior to Admission medications   Medication Sig Start Date End Date Taking? Authorizing Provider  aspirin EC 81 MG tablet Take 1 tablet (81 mg total) by mouth 2 (two) times daily. 12/19/17   Leandrew Koyanagi, MD  HYDROcodone-acetaminophen (NORCO) 5-325 MG tablet Take 1-2 tablets by mouth 3 (three) times daily as needed for moderate pain. 01/10/18   Aundra Dubin, PA-C  methocarbamol (ROBAXIN) 750 MG tablet Take 1 tablet (750 mg total) by mouth 2 (two) times daily as  needed for muscle spasms. 12/19/17   Leandrew Koyanagi, MD  ondansetron (ZOFRAN) 4 MG tablet Take 1-2 tablets (4-8 mg total) by mouth every 8 (eight) hours as needed for nausea or vomiting. 12/19/17   Leandrew Koyanagi, MD  oxyCODONE (OXY IR/ROXICODONE) 5 MG immediate release tablet Take 1-3 tablets (5-15 mg total) by mouth every 6 (six) hours as needed. 12/28/17   Suzan Slick, NP  promethazine (PHENERGAN) 25 MG tablet Take 1 tablet (25 mg total) by mouth every 6 (six) hours as needed for nausea. 12/19/17   Leandrew Koyanagi, MD  senna-docusate (SENOKOT S) 8.6-50 MG tablet Take 1 tablet by mouth at bedtime as needed. 12/19/17   Leandrew Koyanagi, MD  trimethoprim-polymyxin b (POLYTRIM) ophthalmic solution Place 1 drop into both eyes every 4 (four) hours. Patient taking differently: Place 1 drop into both eyes 4 (four) times daily.  11/24/17   Virgel Manifold, MD    Physical Exam: Vitals:   12/05/21 1129 12/05/21 1215 12/05/21 1315  BP: 112/61  110/70  Pulse: 78    Resp: 18  17  Temp: (!) 97.4 F (36.3 C)    SpO2: 100%  100%  Weight:  62.6 kg   Height:  6' (1.829 m)    Physical Exam Vitals and nursing note reviewed.  Constitutional:      General: He is awake. He is not in acute distress.    Appearance: He is well-developed and normal weight. He is ill-appearing. He is not toxic-appearing or diaphoretic.  HENT:     Head: Normocephalic.     Nose: No rhinorrhea.     Mouth/Throat:     Mouth: Mucous membranes are dry.  Eyes:     General: No scleral icterus.    Pupils: Pupils are equal, round, and reactive to light.  Neck:     Vascular: No JVD.  Cardiovascular:     Rate and Rhythm: Normal rate and regular rhythm. Frequent Extrasystoles are present.    Heart sounds: S1 normal and S2 normal.  Pulmonary:     Effort: Pulmonary effort is normal.     Breath sounds: Normal breath sounds.  Abdominal:     General: Bowel sounds are normal.     Palpations: Abdomen is soft.     Tenderness: There is  abdominal tenderness in the right upper quadrant and epigastric area. There is no right CVA tenderness, left CVA tenderness, guarding or rebound.  Musculoskeletal:     Cervical back: Neck supple.     Right lower leg: No edema.     Left lower leg: No edema.  Skin:    General: Skin is warm and dry.  Neurological:     General: No focal deficit present.     Mental Status: He is alert and oriented to person, place, and time.  Psychiatric:        Mood and Affect: Mood normal.        Behavior: Behavior normal. Behavior is cooperative.  Data Reviewed:  [place] labs from 12/02/2021.  Component 12/02/2021 05/30/2021      WBC 13.5 High    10.5  RBC 4.86 5.10  Hemoglobin 14.0 15.1  Hematocrit 42.0 44.9  MCV 86.5 88.1  MCH 28.9 29.5  MCHC 33.4 33.5  RDW 13.7 14.0  Platelets 208 252  MPV 8.6 8.2  Neutrophil % 74 60  Lymphocyte % 18 30  Monocyte % 8 6  Eosinophil % 1 3  Basophil % 0 1  Neutrophil Absolute 9.9 High    6.3  Lymphocyte Absolute 2.4 3.2  Monocyte Absolute 1.0 High    0.6  Eosinophil Absolute 0.1 0.3  Basophil Absolute 0.1 0.1     CHEMISTRY PANELS [facility]   Component 12/02/2021 05/30/2021      Sodium 129 Low    136  Potassium 4.6 3.6  Chloride 92 Low    103  CO2 26 26  BUN 17 11  Glucose 107 High     92  Creatinine 0.88 0.75  Calcium 11.4 High    9.5  Total Protein 8.7 High     7.5  Albumin  4.7 4.3  Total Bilirubin 0.5 0.5  Alkaline Phosphatase 206 High    68  AST (SGOT) 164 High    13  ALT (SGPT) 151 High    12  Anion Gap 11 7  Est. GFR >90 >90    Results are pending, will review when available.  Assessment and Plan: Principal Problem:   Hematemesis Associated with:   Acute blood loss anemia Admit to stepdown/inpatient. Keep NPO for now. Continue IV fluids. Continue pantoprazole infusion. Monitor H&H. Transfuse as needed. GI has agreed to see the patient.  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 Once more stable, we will consult IR for biopsy procedure. He received contrast earlier today. We will check CT chest and CT head tomorrow morning.    Abnormal LFTs Secondary to EtOH/liver metastasis. Monitor hepatic function test.    Hypercalcemia In the setting of malignancy. Continue IV fluids    Hyponatremia Has had decreased oral intake. Has had recent GI losses. Less likely malignancy related. Unchanged since 3 days ago. Continue normal saline infusion. Follow-up sodium level in AM.    Tobacco use Stated he is not smoking anymore. Nicotine replacement therapy as needed.    Alcohol abuse   Cocaine abuse (HCC) Withdrawal prevention. CIWA protocol with lorazepam. Magnesium sulfate supplementation. Folate, MVI and thiamine. Consult transition of care team.       Advance Care Planning:   Code Status: Full code.  Consults: [facility] (Dr. Therisa Doyne).  Family Communication: His sister and niece were at bedside.  Severity of Illness: The appropriate patient status for this patient is INPATIENT. Inpatient status is judged to be reasonable and necessary in order to provide the required intensity of service to ensure the patient's safety. The patient's presenting symptoms, physical exam findings, and initial radiographic and laboratory data in the context of their chronic comorbidities is felt to place them at high risk for further clinical deterioration. Furthermore, it is not anticipated that the patient will be medically stable for discharge from the hospital within 2 midnights of admission.   * I certify that at the point of admission it is my clinical judgment that the patient will  require inpatient hospital care spanning beyond 2 midnights from the point of admission due to high intensity of  service, high risk for further deterioration and high frequency of surveillance required.*  Author: Reubin Milan, MD 12/05/2021 3:59 PM  For on call review [email or web address].   This document was prepared using Dragon voice recognition software and may contain some unintended transcription errors.

## 2021-12-05 NOTE — ED Triage Notes (Signed)
Pt BIB [facility] from home with c/o abd pain with hematemesis. Pt was seen at PCP last week for same. Pt has been taking Zofran without relief. EMS report initial B/P of 94/60. They admin 1040m of NaCl. Pt reports he has lost 40lbs in last month. Pt reports he had "crack" day before yesterday.

## 2021-12-06 ENCOUNTER — Inpatient Hospital Stay (HOSPITAL_COMMUNITY): Payer: Medicare Other

## 2021-12-06 DIAGNOSIS — R7989 Other specified abnormal findings of blood chemistry: Secondary | ICD-10-CM | POA: Diagnosis not present

## 2021-12-06 DIAGNOSIS — F1010 Alcohol abuse, uncomplicated: Secondary | ICD-10-CM | POA: Diagnosis not present

## 2021-12-06 DIAGNOSIS — K920 Hematemesis: Secondary | ICD-10-CM | POA: Diagnosis not present

## 2021-12-06 DIAGNOSIS — D62 Acute posthemorrhagic anemia: Secondary | ICD-10-CM | POA: Diagnosis not present

## 2021-12-06 LAB — PHOSPHORUS: Phosphorus: 4.1 mg/dL (ref 2.5–4.6)

## 2021-12-06 LAB — HEMOGLOBIN AND HEMATOCRIT, BLOOD
HCT: 27.4 % — ABNORMAL LOW (ref 39.0–52.0)
HCT: 29.3 % — ABNORMAL LOW (ref 39.0–52.0)
HCT: 27.6 % — ABNORMAL LOW (ref 39.0–52.0)
HCT: 27 % — ABNORMAL LOW (ref 39.0–52.0)
Hemoglobin: 9.5 g/dL — ABNORMAL LOW (ref 13.0–17.0)
Hemoglobin: 9.9 g/dL — ABNORMAL LOW (ref 13.0–17.0)
Hemoglobin: 9.4 g/dL — ABNORMAL LOW (ref 13.0–17.0)
Hemoglobin: 9.4 g/dL — ABNORMAL LOW (ref 13.0–17.0)

## 2021-12-06 LAB — GLUCOSE, CAPILLARY
Glucose-Capillary: 72 mg/dL (ref 70–99)
Glucose-Capillary: 73 mg/dL (ref 70–99)

## 2021-12-06 LAB — COMPREHENSIVE METABOLIC PANEL
ALT: 102 U/L — ABNORMAL HIGH (ref 0–44)
AST: 173 U/L — ABNORMAL HIGH (ref 15–41)
Albumin: 3 g/dL — ABNORMAL LOW (ref 3.5–5.0)
Alkaline Phosphatase: 151 U/L — ABNORMAL HIGH (ref 38–126)
Anion gap: 7 (ref 5–15)
BUN: 15 mg/dL (ref 8–23)
CO2: 23 mmol/L (ref 22–32)
Calcium: 10 mg/dL (ref 8.9–10.3)
Chloride: 100 mmol/L (ref 98–111)
Creatinine, Ser: 0.65 mg/dL (ref 0.61–1.24)
GFR, Estimated: >60 mL/min (ref >60)
Glucose, Bld: 82 mg/dL (ref 70–99)
Potassium: 4.1 mmol/L (ref 3.5–5.1)
Sodium: 130 mmol/L — ABNORMAL LOW (ref 135–145)
Total Bilirubin: 0.6 mg/dL (ref 0.3–1.2)
Total Protein: 6.2 g/dL — ABNORMAL LOW (ref 6.5–8.1)

## 2021-12-06 LAB — CBC
HCT: 29 % — ABNORMAL LOW (ref 39.0–52.0)
Hemoglobin: 9.9 g/dL — ABNORMAL LOW (ref 13.0–17.0)
MCH: 29.4 pg (ref 26.0–34.0)
MCHC: 34.1 g/dL (ref 30.0–36.0)
MCV: 86.1 fL (ref 80.0–100.0)
Platelets: 175 10*3/uL (ref 150–400)
RBC: 3.37 MIL/uL — ABNORMAL LOW (ref 4.22–5.81)
RDW: 12.8 % (ref 11.5–15.5)
WBC: 10.3 10*3/uL (ref 4.0–10.5)
nRBC: 0 % (ref 0.0–0.2)

## 2021-12-06 LAB — HIV ANTIBODY (ROUTINE TESTING W REFLEX): HIV Screen 4th Generation wRfx: NONREACTIVE

## 2021-12-06 LAB — MAGNESIUM: Magnesium: 1.8 mg/dL (ref 1.7–2.4)

## 2021-12-06 MED ORDER — SODIUM CHLORIDE 0.9 % IV SOLN: INTRAVENOUS | Status: DC

## 2021-12-06 MED ORDER — PEG 3350-KCL-NA BICARB-NACL 420 G PO SOLR
4000.0000 mL | Freq: Once | ORAL | Status: AC
  Administered 2021-12-06: 4000 mL via ORAL

## 2021-12-06 MED ORDER — INFLUENZA VAC SPLIT QUAD 0.5 ML IM SUSY
0.5000 mL | PREFILLED_SYRINGE | INTRAMUSCULAR | Status: AC
  Administered 2021-12-08: 0.5 mL via INTRAMUSCULAR
  Filled 2021-12-06: qty 0.5

## 2021-12-06 MED ORDER — IOHEXOL 300 MG/ML  SOLN
75.0000 mL | Freq: Once | INTRAMUSCULAR | Status: AC | PRN
  Administered 2021-12-06: 75 mL via INTRAVENOUS

## 2021-12-06 MED ORDER — BISACODYL 10 MG RE SUPP
10.0000 mg | Freq: Once | RECTAL | Status: AC
  Administered 2021-12-06: 10 mg via RECTAL
  Filled 2021-12-06: qty 1

## 2021-12-06 MED ORDER — SODIUM CHLORIDE (PF) 0.9 % IJ SOLN
INTRAMUSCULAR | Status: AC
  Filled 2021-12-06: qty 50

## 2021-12-06 MED ORDER — SODIUM CHLORIDE 0.9 % IV BOLUS
750.0000 mL | Freq: Once | INTRAVENOUS | Status: AC
  Administered 2021-12-06: 750 mL via INTRAVENOUS

## 2021-12-06 MED ORDER — SODIUM CHLORIDE 0.9 % IV BOLUS
250.0000 mL | Freq: Once | INTRAVENOUS | Status: AC
  Administered 2021-12-06: 250 mL via INTRAVENOUS

## 2021-12-06 NOTE — Plan of Care (Signed)

## 2021-12-06 NOTE — Progress Notes (Signed)
PROGRESS NOTE    Willie Parker  MPN:[id number] DOB: 12-Jan-1956 DOA: 12/05/2021 PCP: Egbert Garibaldi, PA-C    Brief Narrative:  Willie Parker is a 66 y.o. male with past medical history significant of osteoarthritis,  peptic ulcer disease, tobacco,cocaine, alcohol use presented to the hospital with abdominal pain, 40 pound weight loss, fatigue, nausea, vomiting for last 2 weeks with an episode of hematemesis on the day of presentation.  He had been having constipation for 2 weeks or so and had melanotic bowel movements.  In the ED, patient was hemodynamically stable.  WBC was elevated at 12.8 hemoglobin 12.1 and hemoglobin 3 days back was 14.0.  Sodium was 129 creatinine of 0.9.  AST ALT and alkaline phosphatase was elevated.  Chest x-ray showed no acute disease.  CT abdomen/pelvis with contrast with numerous liver lesions as well as numerous lytic bone lesions of the spine as well as the pelvis consistent with metastatic disease.  Patient was then considered for admission to the hospital for further evaluation and treatment.    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 GI has been consulted.  Resuscitated with IV fluids and PRBC transfusion.  Continue Protonix.  Latest hemoglobin of 9.4.  Lactate has improved at this time.  GI has seen the patient at this time and recommend EGD and colonoscopy tomorrow.  On clear liquid today.  Continue Protonix drip, hold aspirin from home.  Abdominal pain, nausea, vomiting, constipation.  Liver and bone metastasis with unintentional weight loss.  Metastatic malignancy suspected. Lipase 29.  Likely secondary to metastatic disease.   Continue supportive care.  Continue Protonix drip.  Follow-up CT of the chest, CT head scan.  Resume narcotic from home for adequate pain control.     Abnormal LFTs Secondary to EtOH/liver metastasis.  We will continue to monitor.  INR was 1.0.  Continue CIWA protocol.  Hypotension.  Improved at this time.  Received IV fluid and PRBC.  Likely secondary to volume depletion.    Hypercalcemia Likely secondary to bony metastasis.  Continue IV fluids.  Mild hyponatremia Sodium level of 130.  Likely secondary to poor oral intake in history of EtOH abuse.  On normal saline.  Check BMP in AM.    Substance use disorder. States that he is not drinking anymore.  On CIWA protocol.  Continue thiamine folic acid multivitamin.  Urine drug screen was positive for opiates and cocaine.  EtOH level was less than 10.  Mild leukocytosis.  We will continue to monitor.  UA was negative.  Chest x-ray without any infiltrate.  Likely reactive.     DVT prophylaxis: SCDs Start: 12/05/21 1630   Code Status:     Code Status: Full Code  Disposition: Home likely in 2 to 3 days  Status is: Inpatient Remains inpatient appropriate because: Need for EGD colonoscopy,   Family Communication: Communicated with the patient's daughter at bedside.  Consultants:  GI  Procedures:  None yet  Antimicrobials:  None  Anti-infectives (From admission, onward)    None       Subjective: Today, patient was seen and examined at bedside.  Complains of epigastric  discomfort.  Feels hungry.  Denies any nausea vomiting or hematemesis.  Objective: Vitals:   12/06/21 0522 12/06/21 0545 12/06/21 0631 12/06/21 0635  BP:  113/67  95/63  Pulse:  85  74  Resp:  17  16  Temp: 98.6 F (37 C) 98.6 F (37 C)  98.1 F (36.7 C)  TempSrc: Oral Oral  Oral  SpO2:  98%  99%  Weight:   63.8 kg   Height:   6' (1.829 m)     Intake/Output Summary (Last 24 hours) at 12/06/2021 1454 Last data filed at 12/06/2021 1100 Gross per  24 hour  Intake 1215.27 ml  Output 600 ml  Net 615.27 ml   Filed Weights   12/05/21 1215 12/06/21 0631  Weight: 62.6 kg 63.8 kg    Physical Examination: Body mass index is 19.08 kg/m.   General: Alert awake and Communicative, not in obvious distress, thinly built HENT: Scleral pallor noted.  Oral mucosa is moist.  Chest:  Clear breath sounds.  Diminished breath sounds bilaterally. No crackles or wheezes.  CVS: S1 &S2 heard. No murmur.  Regular rate and rhythm. Abdomen:  tenderness over the epigastric region, mild guarding nondistended.  Bowel sounds are heard.   Extremities: No cyanosis, clubbing or edema.  Peripheral pulses are palpable. Psych: Alert, awake and oriented, normal mood CNS:  No cranial nerve deficits.  Power equal in all extremities.   Skin: Warm and dry.  No rashes noted.  Data Reviewed:   CBC: Recent Labs  Lab 12/05/21 1236 12/05/21 1943 12/05/21 2339 12/06/21 0431 12/06/21 0800 12/06/21 1145  WBC 12.8*  --   --  10.3  --   --   NEUTROABS 8.9*  --   --   --   --   --   HGB 12.1* 9.4* 9.5* 9.9* 9.9* 9.4*  HCT 35.5* 27.6* 27.4* 29.0* 29.3* 27.6*  MCV 85.5  --   --  86.1  --   --   PLT 198  --   --  175  --   --     Basic Metabolic Panel: Recent Labs  Lab 12/05/21 1236 12/06/21 0431  NA 129* 130*  K 4.1 4.1  CL 94* 100  CO2 27 23  GLUCOSE 101* 82  BUN 24* 15  CREATININE 0.91 0.65  CALCIUM 10.6* 10.0  MG  --  1.8  PHOS  --  4.1    Liver Function Tests: Recent Labs  Lab 12/05/21 1236 12/06/21 0431  AST 189* 173*  ALT 114* 102*  ALKPHOS 175* 151*  BILITOT 0.5 0.6  PROT 7.4 6.2*  ALBUMIN 3.7 3.0*     Radiology Studies: CT Abdomen Pelvis W Contrast  Result Date: 12/05/2021 CLINICAL DATA:  Nausea vomiting.  Abdominal pain. EXAM: CT ABDOMEN AND PELVIS WITH CONTRAST TECHNIQUE: Multidetector CT imaging of the abdomen and pelvis was performed using the standard protocol following bolus administration of intravenous contrast. RADIATION  DOSE REDUCTION: This exam was performed according to the departmental dose-optimization program which includes automated exposure control, adjustment of the mA and/or kV according to patient size and/or use of iterative reconstruction technique. CONTRAST:  183m OMNIPAQUE IOHEXOL 300 MG/ML  SOLN COMPARISON:  05/30/2021. FINDINGS: Lower chest: Clear lung bases. Hepatobiliary: Numerous hypoattenuating liver masses that are new from the prior CT, largest in the right lobe, segment 7, 4.7 cm. Possible small gallstone. No acute cholecystitis. No bile duct dilation. Pancreas: Unremarkable. No pancreatic ductal dilatation or surrounding inflammatory changes. Spleen: Normal in size  without focal abnormality. Adrenals/Urinary Tract: Normal adrenal glands. Kidneys normal size, orientation and position with symmetric enhancement and excretion. Mid to upper pole left renal cyst, 1.1 cm. No follow-up recommended. No other renal masses, no stones and no hydronephrosis. Normal ureters. Bladder is unremarkable. Stomach/Bowel: Stomach is mostly decompressed, otherwise unremarkable. Small bowel is normal in caliber. No wall thickening or inflammation. Stomach is mildly distended, mostly along the ascending and transverse portions, with moderate increased stool burden. No colonic wall thickening. No inflammatory changes. No evidence of a mass. Normal appendix visualized. Vascular/Lymphatic: Aortic atherosclerosis. No aneurysm. Poorly defined enlarged porta hepatis lymph nodes, up to 1.4 cm in short axis. Reproductive: Enlarged prostate, 4.5 x 4.4 cm. Other: No ascites. Musculoskeletal: Numerous lytic bone lesions throughout the visualized spine, as well as the pelvis. No fracture. IMPRESSION: 1. Numerous liver lesions as well as numerous lytic bone lesions consistent with metastatic disease. Primary malignancy not defined on this exam. 2. No acute findings. 3. Moderate increase in the colonic stool burden. 4. Aortic atherosclerosis.  Electronically Signed   By: Lajean Manes M.D.   On: 12/05/2021 14:40   DG Chest Port 1 View  Result Date: 12/05/2021 CLINICAL DATA:  Abdominal pain.  Hematemesis.  Former smoker. EXAM: PORTABLE CHEST 1 VIEW COMPARISON:  12/12/2017. FINDINGS: Cardiac silhouette normal in size. Normal mediastinal and hilar contours. Clear lungs.  No pleural effusion or pneumothorax. Skeletal structures are grossly intact. IMPRESSION: No active disease. Electronically Signed   By: Lajean Manes M.D.   On: 12/05/2021 12:36      LOS: 1 day    Flora Lipps, MD [facility] Available via Epic secure chat 7am-7pm After these hours, please refer to coverage provider listed on [email or web address] 12/06/2021, 2:54 PM

## 2021-12-06 NOTE — ED Notes (Signed)
Patient is hypotensive. MD was made aware.

## 2021-12-06 NOTE — TOC Initial Note (Signed)
Transition of Care [facility]) - Initial/Assessment Note    Patient Details  Name: Willie Parker MRN: [id number] Date of Birth: Jul 03, 1955  [facility] CM/SW Contact:    Vassie Moselle, LCSW Phone Number: 12/06/2021, 12:06 PM  Clinical Narrative:                 Met with pt and discussed current SA. Pt reports he has never been to rehab or received SA counseling. Pt is appreciative of resources that can be provided to assist him in his recovery efforts. SA resources have been added to patients AVS.   Expected Discharge Plan: Home/Self Care Barriers to Discharge: Continued Medical Work up   Patient Goals and CMS Choice Patient states their goals for this hospitalization and ongoing recovery are:: To go home CMS Medicare.gov Compare Post Acute Care list provided to:: Patient Choice offered to / list presented to : Patient  Expected Discharge Plan and Services Expected Discharge Plan: Home/Self Care In-house Referral: NA Discharge Planning Services: CM Consult Post Acute Care Choice: NA Living arrangements for the past 2 months: Single Family Home                 DME Arranged: N/A DME Agency: NA                  Prior Living Arrangements/Services Living arrangements for the past 2 months: Single Family Home Lives with:: Self Patient language and need for interpreter reviewed:: Yes Do you feel safe going back to the place where you live?: Yes      Need for Family Participation in Patient Care: No (Comment) Care giver support system in place?: No (comment)   Criminal Activity/Legal Involvement Pertinent to Current Situation/Hospitalization: No - Comment as needed  Activities of Daily Living Home Assistive Devices/Equipment: None ADL Screening (condition at time of admission) Patient's cognitive ability adequate to safely complete daily activities?: Yes Is the patient deaf or have difficulty hearing?: No Does the patient have difficulty seeing, even when  wearing glasses/contacts?: No Does the patient have difficulty concentrating, remembering, or making decisions?: No Patient able to express need for assistance with ADLs?: Yes Does the patient have difficulty dressing or bathing?: No Independently performs ADLs?: Yes (appropriate for developmental age) Does the patient have difficulty walking or climbing stairs?: Yes Weakness of Legs: Both Weakness of Arms/Hands: None  Permission Sought/Granted   Permission granted to share information with : No              Emotional Assessment Appearance:: Appears stated age Attitude/Demeanor/Rapport: Engaged Affect (typically observed): Accepting Orientation: : Oriented to Self, Oriented to Place, Oriented to  Time, Oriented to Situation Alcohol / Substance Use: Illicit Drugs, Alcohol Use, Tobacco Use Psych Involvement: No (comment)  Admission diagnosis:  Hematemesis [K92.0] Generalized abdominal pain [R10.84] Elevated LFTs [R79.89] Liver lesion [K76.9] Nausea and vomiting, unspecified vomiting type [R11.2] Patient Active Problem List   Diagnosis Date Noted   Hematemesis 12/05/2021   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 ([place]) 12/05/2021   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 ([place]) 12/05/2021   Unintentional weight loss 12/05/2021   Acute blood loss anemia 12/05/2021   Abnormal LFTs 12/05/2021   Hypercalcemia 12/05/2021   Hyponatremia 12/05/2021   Tobacco use 12/05/2021   Alcohol abuse 12/05/2021   Cocaine abuse ([place]) 12/05/2021   Primary osteoarthritis of right knee 12/19/2017   S/P total knee arthroplasty, right 12/19/2017   PCP:  Egbert Garibaldi, PA-C Pharmacy:   [facility]  [place], [place] AT [place] [address] [place] [address] Phone: [phone] Fax: [phone]     Social Determinants of Health (SDOH) Interventions    Readmission Risk Interventions    12/06/2021   12:05 PM  Readmission Risk Prevention  Plan  Transportation Screening Complete  PCP or Specialist Appt within 5-7 Days Complete  Home Care Screening Complete  Medication Review (RN CM) Complete

## 2021-12-06 NOTE — Hospital Course (Addendum)
Willie Parker is a 66 y.o. male with past medical history significant of osteoarthritis,  peptic ulcer disease, tobacco,cocaine, alcohol use presented to the hospital with abdominal pain, 40 pound weight loss, fatigue, nausea, vomiting for last 2 weeks with an episode of hematemesis on the day of presentation.  He had been having constipation for 2 weeks or so and had melanotic bowel movements.  In the ED patient was hemodynamically stable.  WBC was elevated at 12.8 hemoglobin 4.1 in hemoglobin 3 days back was 14.0.  Sodium was 129 creatinine of 0.9.  AST ALT and alkaline phosphatase was elevated.  Chest x-ray showed no acute disease.  CT abdomen/pelvis with contrast with numerous liver lesions as well as numerous lytic bone lesions of the spine as well as the pelvis consistent with metastatic disease.  Patient was then considered for admission to the hospital for further evaluation and treatment.

## 2021-12-06 NOTE — ED Notes (Signed)
Room is ready per RN. Patient is ready for transport.

## 2021-12-06 NOTE — Consult Note (Signed)
[facility] Consult  Referring Provider: ER Primary Care Physician:  Egbert Garibaldi, PA-C Primary Gastroenterologist: Althia Forts  Reason for Consultation: Coffee-ground emesis, history of melena, abnormal CAT scan concerning for metastatic disease  HPI: Willie Parker is a 66 y.o. male presented to the ER with complains of worsening generalized abdominal pain, 40 pound of unintentional weight loss in 1 month, multiple episodes of nausea, vomiting, some with coffee-ground emesis, constipation with no bowel movement for 2 weeks with history of black stools in the past.  No prior EGD or colonoscopy. Patient drinks beer on a daily basis and is also smoker. He also uses crack cocaine, last used 2 days ago. He is not on aspirin, NSAIDs, antiplatelets or blood thinners. He denies difficulty swallowing, pain on swallowing or complains of unintentional weight loss or loss of appetite.  No family history of colon cancer.   Past Medical History:  Diagnosis Date   Arthritis    Knee pain    Peptic ulcer     Past Surgical History:  Procedure Laterality Date   MANDIBLE FRACTURE SURGERY     when he was 66 year old   TOTAL KNEE ARTHROPLASTY Right 12/19/2017   Procedure: RIGHT TOTAL KNEE ARTHROPLASTY;  Surgeon: Leandrew Koyanagi, MD;  Location: [facility];  Service: Orthopedics;  Laterality: Right;    Prior to Admission medications   Medication Sig Start Date End Date Taking? Authorizing Provider  azithromycin (ZITHROMAX) 250 MG tablet Take 250-500 mg by mouth See admin instructions. Take 500 mg by mouth on day one, then decrease to 250 mg once daily on days 2-5 11/30/21  Yes [provider]  ondansetron (ZOFRAN) 4 MG tablet Take 1-2 tablets (4-8 mg total) by mouth every 8 (eight) hours as needed for nausea or vomiting. Patient taking differently: Take 4 mg by mouth 3 (three) times daily as needed for nausea or vomiting. 12/19/17  Yes Leandrew Koyanagi, MD  TYLENOL 500 MG tablet Take  500-1,000 mg by mouth every 6 (six) hours as needed (for pain).   Yes [provider]  aspirin EC 81 MG tablet Take 1 tablet (81 mg total) by mouth 2 (two) times daily. Patient not taking: Reported on 12/05/2021 12/19/17   Leandrew Koyanagi, MD  HYDROcodone-acetaminophen (NORCO) 5-325 MG tablet Take 1-2 tablets by mouth 3 (three) times daily as needed for moderate pain. Patient not taking: Reported on 12/05/2021 01/10/18   Aundra Dubin, PA-C  methocarbamol (ROBAXIN) 750 MG tablet Take 1 tablet (750 mg total) by mouth 2 (two) times daily as needed for muscle spasms. Patient not taking: Reported on 12/05/2021 12/19/17   Leandrew Koyanagi, MD  oxyCODONE (OXY IR/ROXICODONE) 5 MG immediate release tablet Take 1-3 tablets (5-15 mg total) by mouth every 6 (six) hours as needed. Patient not taking: Reported on 12/05/2021 12/28/17   Suzan Slick, NP  promethazine (PHENERGAN) 25 MG tablet Take 1 tablet (25 mg total) by mouth every 6 (six) hours as needed for nausea. Patient not taking: Reported on 12/05/2021 12/19/17   Leandrew Koyanagi, MD  senna-docusate (SENOKOT S) 8.6-50 MG tablet Take 1 tablet by mouth at bedtime as needed. Patient not taking: Reported on 12/05/2021 12/19/17   Leandrew Koyanagi, MD  trimethoprim-polymyxin b (POLYTRIM) ophthalmic solution Place 1 drop into both eyes every 4 (four) hours. Patient not taking: Reported on 12/05/2021 11/24/17   Virgel Manifold, MD    Current Facility-Administered Medications  Medication Dose Route Frequency Provider Last Rate Last Admin  0.9 %  sodium chloride infusion   Intravenous Continuous Reubin Milan, MD 100 mL/hr at 12/06/21 1219 New Bag at 12/06/21 1219   acetaminophen (TYLENOL) tablet 650 mg  650 mg Oral Q6H PRN Reubin Milan, MD       Or   acetaminophen (TYLENOL) suppository 650 mg  650 mg Rectal Q6H PRN Reubin Milan, MD       folic acid (FOLVITE) tablet 1 mg  1 mg Oral Daily Reubin Milan, MD   1 mg at 12/06/21 1027   [START  ON 12/07/2021] influenza vac split quadrivalent PF (FLUARIX) injection 0.5 mL  0.5 mL Intramuscular Tomorrow-1000 Pokhrel, Laxman, MD       LORazepam (ATIVAN) injection 0-4 mg  0-4 mg Intravenous Q6H Reubin Milan, MD       Followed by   Derrill Memo ON 12/07/2021] LORazepam (ATIVAN) injection 0-4 mg  0-4 mg Intravenous Q12H Reubin Milan, MD       LORazepam (ATIVAN) tablet 1-4 mg  1-4 mg Oral Q1H PRN Reubin Milan, MD       Or   LORazepam (ATIVAN) injection 1-4 mg  1-4 mg Intravenous Q1H PRN Reubin Milan, MD       morphine (PF) 4 MG/ML injection 4 mg  4 mg Intravenous Q3H PRN Hayden Rasmussen, MD   4 mg at 12/06/21 1027   multivitamin with minerals tablet 1 tablet  1 tablet Oral Daily Reubin Milan, MD   1 tablet at 12/06/21 1027   ondansetron (ZOFRAN) tablet 4 mg  4 mg Oral Q6H PRN Reubin Milan, MD       Or   ondansetron [facility]) injection 4 mg  4 mg Intravenous Q6H PRN Reubin Milan, MD       [START ON 12/09/2021] pantoprazole (PROTONIX) injection 40 mg  40 mg Intravenous Q12H Reubin Milan, MD       pantoprazole (PROTONIX) injection 40 mg  40 mg Intravenous Once Reubin Milan, MD       pantoprozole (PROTONIX) 80 mg /NS 100 mL infusion  8 mg/hr Intravenous Continuous Reubin Milan, MD 10 mL/hr at 12/06/21 0520 8 mg/hr at 12/06/21 0520   polyethylene glycol-electrolytes (NuLYTELY) solution 4,000 mL  4,000 mL Oral Once Ronnette Juniper, MD       thiamine (VITAMIN B1) tablet 100 mg  100 mg Oral Daily Reubin Milan, MD   100 mg at 12/06/21 1027   Or   thiamine (VITAMIN B1) injection 100 mg  100 mg Intravenous Daily Reubin Milan, MD   100 mg at 12/05/21 1721    Allergies as of 12/05/2021   (No Known Allergies)    Family History  Problem Relation Age of Onset   Diabetes Mother    Hypertension Mother    Hypertension Sister    Diabetes Sister     Social History   Socioeconomic History   Marital status: Divorced    Spouse  name: Not on file   Number of children: Not on file   Years of education: Not on file   Highest education level: Not on file  Occupational History   Not on file  Tobacco Use   Smoking status: Every Day    Packs/day: 0.25    Types: Cigarettes   Smokeless tobacco: Never  Vaping Use   Vaping Use: Never used  Substance and Sexual Activity   Alcohol use: Yes   Drug use: Yes    Types:  Cocaine    Comment: last use 12/03/21   Sexual activity: Not Currently  Other Topics Concern   Not on file  Social History Narrative   Not on file   Social Determinants of Health   Financial Resource Strain: Not on file  Food Insecurity: No Food Insecurity (12/06/2021)   Hunger Vital Sign    Worried About Running Out of Food in the Last Year: Never true    Ran Out of Food in the Last Year: Never true  Transportation Needs: No Transportation Needs (12/06/2021)   PRAPARE - Hydrologist (Medical): No    Lack of Transportation (Non-Medical): No  Physical Activity: Not on file  Stress: Not on file  Social Connections: Not on file  Intimate Partner Violence: Not At Risk (12/06/2021)   Humiliation, Afraid, Rape, and Kick questionnaire    Fear of Current or Ex-Partner: No    Emotionally Abused: No    Physically Abused: No    Sexually Abused: No    Review of Systems: Positive for: GI: Described in detail in HPI.    Gen: anorexia, fatigue, weakness, malaise, involuntary weight loss, denies any fever, chills, rigors, night sweats,  and sleep disorder CV: Denies chest pain, angina, palpitations, syncope, orthopnea, PND, peripheral edema, and claudication. Resp: Denies dyspnea, cough, sputum, wheezing, coughing up blood. GU : Denies urinary burning, blood in urine, urinary frequency, urinary hesitancy, nocturnal urination, and urinary incontinence. MS: Right knee surgery Derm: Denies rash, itching, oral ulcerations, hives, unhealing ulcers.  Psych: Denies depression, anxiety,  memory loss, suicidal ideation, hallucinations,  and confusion. Heme: Denies bruising, bleeding, and enlarged lymph nodes. Neuro:  Denies any headaches, dizziness, paresthesias. Endo:  Denies any problems with DM, thyroid, adrenal function.  Physical Exam: Vital signs in last 24 hours: Temp:  [98.1 F (36.7 C)-98.6 F (37 C)] 98.1 F (36.7 C) (10/08 0635) Pulse Rate:  [68-90] 74 (10/08 0635) Resp:  [12-21] 16 (10/08 0635) BP: (89-114)/(54-70) 95/63 (10/08 0635) SpO2:  [96 %-100 %] 99 % (10/08 0635) Weight:  [63.8 kg] 63.8 kg (10/08 0631) Last BM Date :  (About 2 weeks ago per patient)  General:   Alert,  Well-developed, thin appearing, pleasant and cooperative in NAD Head:  Normocephalic and atraumatic. Eyes:  Sclera clear, no icterus.   Mild pallor  Nose:  No deformity, discharge,  or lesions. Mouth:  No deformity or lesions.  Oropharynx pink & moist. Neck:  Supple; no masses or thyromegaly. Lungs:  Clear throughout to auscultation.   No wheezes, crackles, or rhonchi. No acute distress. Heart:  Regular rate and rhythm; no murmurs, clicks, rubs,  or gallops. Extremities:  Without clubbing or edema. Neurologic:  Alert and  oriented x4;  grossly normal neurologically. Skin:  Intact without significant lesions or rashes. Psych:  Alert and cooperative. Normal mood and affect. Abdomen:  Soft, mild generalized tenderness and nondistended. No masses, hepatosplenomegaly or hernias noted. Normal bowel sounds, without guarding, and without rebound.         Lab Results: Recent Labs    12/05/21 1236 12/05/21 1943 12/06/21 0431 12/06/21 0800 12/06/21 1145  WBC 12.8*  --  10.3  --   --   HGB 12.1*   < > 9.9* 9.9* 9.4*  HCT 35.5*   < > 29.0* 29.3* 27.6*  PLT 198  --  175  --   --    < > = values in this interval not displayed.   BMET Recent Labs  12/05/21 1236 12/06/21 0431  NA 129* 130*  K 4.1 4.1  CL 94* 100  CO2 27 23  GLUCOSE 101* 82  BUN 24* 15  CREATININE 0.91  0.65  CALCIUM 10.6* 10.0   LFT Recent Labs    12/06/21 0431  PROT 6.2*  ALBUMIN 3.0*  AST 173*  ALT 102*  ALKPHOS 151*  BILITOT 0.6   PT/INR Recent Labs    12/05/21 1236  LABPROT 12.7  INR 1.0    Studies/Results: CT Abdomen Pelvis W Contrast  Result Date: 12/05/2021 CLINICAL DATA:  Nausea vomiting.  Abdominal pain. EXAM: CT ABDOMEN AND PELVIS WITH CONTRAST TECHNIQUE: Multidetector CT imaging of the abdomen and pelvis was performed using the standard protocol following bolus administration of intravenous contrast. RADIATION DOSE REDUCTION: This exam was performed according to the departmental dose-optimization program which includes automated exposure control, adjustment of the mA and/or kV according to patient size and/or use of iterative reconstruction technique. CONTRAST:  143m OMNIPAQUE IOHEXOL 300 MG/ML  SOLN COMPARISON:  05/30/2021. FINDINGS: Lower chest: Clear lung bases. Hepatobiliary: Numerous hypoattenuating liver masses that are new from the prior CT, largest in the right lobe, segment 7, 4.7 cm. Possible small gallstone. No acute cholecystitis. No bile duct dilation. Pancreas: Unremarkable. No pancreatic ductal dilatation or surrounding inflammatory changes. Spleen: Normal in size without focal abnormality. Adrenals/Urinary Tract: Normal adrenal glands. Kidneys normal size, orientation and position with symmetric enhancement and excretion. Mid to upper pole left renal cyst, 1.1 cm. No follow-up recommended. No other renal masses, no stones and no hydronephrosis. Normal ureters. Bladder is unremarkable. Stomach/Bowel: Stomach is mostly decompressed, otherwise unremarkable. Small bowel is normal in caliber. No wall thickening or inflammation. Stomach is mildly distended, mostly along the ascending and transverse portions, with moderate increased stool burden. No colonic wall thickening. No inflammatory changes. No evidence of a mass. Normal appendix visualized. Vascular/Lymphatic:  Aortic atherosclerosis. No aneurysm. Poorly defined enlarged porta hepatis lymph nodes, up to 1.4 cm in short axis. Reproductive: Enlarged prostate, 4.5 x 4.4 cm. Other: No ascites. Musculoskeletal: Numerous lytic bone lesions throughout the visualized spine, as well as the pelvis. No fracture. IMPRESSION: 1. Numerous liver lesions as well as numerous lytic bone lesions consistent with metastatic disease. Primary malignancy not defined on this exam. 2. No acute findings. 3. Moderate increase in the colonic stool burden. 4. Aortic atherosclerosis. Electronically Signed   By: DLajean ManesM.D.   On: 12/05/2021 14:40   DG Chest Port 1 View  Result Date: 12/05/2021 CLINICAL DATA:  Abdominal pain.  Hematemesis.  Former smoker. EXAM: PORTABLE CHEST 1 VIEW COMPARISON:  12/12/2017. FINDINGS: Cardiac silhouette normal in size. Normal mediastinal and hilar contours. Clear lungs.  No pleural effusion or pneumothorax. Skeletal structures are grossly intact. IMPRESSION: No active disease. Electronically Signed   By: DLajean ManesM.D.   On: 12/05/2021 12:36    Impression: Nausea, vomiting, abdominal pain, constipation Coffee-ground emesis, history of melena Abnormal CAT scan with numerous liver and lytic bone lesions consistent with metastatic disease, primary not identified  Normocytic anemia, hemoglobin 12.1 on admission, 9.4/9.5/9.9/9.9/9.4 Mild hyponatremia Low total protein of 6.2 and low albumin of 3  Slightly elevated ALP of 151 likely related to bony metastases Elevated AST/ALT of 173/102 likely related to liver metastases  Alcohol use Smoker Cocaine use  Plan: EGD and colonoscopy tomorrow. The risks and the benefits of the procedure were discussed with the patient in details and later with his sister, they verbalized understanding and consent.  Patient  is on clear liquid diet, will write for colonic prep and schedule the patient for both the procedures with Dr. Randel Pigg tomorrow.  Continue  Protonix at 8 mg/h. Continue thiamine 100 mg, multivitamin 1 tablet daily, folic acid 1 mg daily along with IV Ativan as needed for possible alcohol withdrawal symptoms.   LOS: 1 day   Ronnette Juniper, MD  12/06/2021, 12:36 PM

## 2021-12-07 DIAGNOSIS — D62 Acute posthemorrhagic anemia: Secondary | ICD-10-CM | POA: Diagnosis not present

## 2021-12-07 DIAGNOSIS — F1010 Alcohol abuse, uncomplicated: Secondary | ICD-10-CM | POA: Diagnosis not present

## 2021-12-07 DIAGNOSIS — R7989 Other specified abnormal findings of blood chemistry: Secondary | ICD-10-CM | POA: Diagnosis not present

## 2021-12-07 DIAGNOSIS — K920 Hematemesis: Secondary | ICD-10-CM | POA: Diagnosis not present

## 2021-12-07 LAB — COMPREHENSIVE METABOLIC PANEL
ALT: 111 U/L — ABNORMAL HIGH (ref 0–44)
AST: 210 U/L — ABNORMAL HIGH (ref 15–41)
Albumin: 3.2 g/dL — ABNORMAL LOW (ref 3.5–5.0)
Alkaline Phosphatase: 162 U/L — ABNORMAL HIGH (ref 38–126)
Anion gap: 7 (ref 5–15)
BUN: 11 mg/dL (ref 8–23)
CO2: 25 mmol/L (ref 22–32)
Calcium: 10.6 mg/dL — ABNORMAL HIGH (ref 8.9–10.3)
Chloride: 99 mmol/L (ref 98–111)
Creatinine, Ser: 0.87 mg/dL (ref 0.61–1.24)
GFR, Estimated: >60 mL/min (ref >60)
Glucose, Bld: 100 mg/dL — ABNORMAL HIGH (ref 70–99)
Potassium: 4.2 mmol/L (ref 3.5–5.1)
Sodium: 131 mmol/L — ABNORMAL LOW (ref 135–145)
Total Bilirubin: 0.7 mg/dL (ref 0.3–1.2)
Total Protein: 6.5 g/dL (ref 6.5–8.1)

## 2021-12-07 LAB — HEMOGLOBIN AND HEMATOCRIT, BLOOD
HCT: 24.1 % — ABNORMAL LOW (ref 39.0–52.0)
HCT: 29.7 % — ABNORMAL LOW (ref 39.0–52.0)
HCT: 29.7 % — ABNORMAL LOW (ref 39.0–52.0)
HCT: 30.6 % — ABNORMAL LOW (ref 39.0–52.0)
Hemoglobin: 10.1 g/dL — ABNORMAL LOW (ref 13.0–17.0)
Hemoglobin: 10.4 g/dL — ABNORMAL LOW (ref 13.0–17.0)
Hemoglobin: 8.2 g/dL — ABNORMAL LOW (ref 13.0–17.0)
Hemoglobin: 9.9 g/dL — ABNORMAL LOW (ref 13.0–17.0)

## 2021-12-07 LAB — MAGNESIUM: Magnesium: 1.8 mg/dL (ref 1.7–2.4)

## 2021-12-07 MED ORDER — POLYETHYLENE GLYCOL 3350 17 GM/SCOOP PO POWD
1.0000 | Freq: Once | ORAL | Status: AC
  Administered 2021-12-07: 255 g via ORAL
  Filled 2021-12-07: qty 255

## 2021-12-07 NOTE — H&P (View-Only) (Signed)
[facility] Progress Note  SUBJECTIVE:   Interval history: Willie Parker was seen and evaluated today at bedside. Resting comfortably in bed with family at bedside. Noted 40 lb unintentional weight loss over the past 1-2 months. Having melena, some lower abdominal pain that is improving. No chest pain or shortness of breath. Feeling fatigued. Bowel preparation incomplete given intolerance to prep solution, will re-attempt bowel preparation today with Miralax/Gatorade solution and attempt EGD/COL on 12/08/21.  Past Medical History:  Diagnosis Date   Arthritis    Knee pain    Peptic ulcer    Past Surgical History:  Procedure Laterality Date   MANDIBLE FRACTURE SURGERY     when he was 66 year old   TOTAL KNEE ARTHROPLASTY Right 12/19/2017   Procedure: RIGHT TOTAL KNEE ARTHROPLASTY;  Surgeon: Leandrew Koyanagi, MD;  Location: [facility];  Service: Orthopedics;  Laterality: Right;   Current Facility-Administered Medications  Medication Dose Route Frequency Provider Last Rate Last Admin   0.9 %  sodium chloride infusion   Intravenous Continuous Reubin Milan, MD 100 mL/hr at 12/07/21 0400 Infusion Verify at 12/07/21 0400   0.9 %  sodium chloride infusion   Intravenous Continuous Ronnette Juniper, MD       acetaminophen (TYLENOL) tablet 650 mg  650 mg Oral Q6H PRN Reubin Milan, MD       Or   acetaminophen (TYLENOL) suppository 650 mg  650 mg Rectal Q6H PRN Reubin Milan, MD       folic acid (FOLVITE) tablet 1 mg  1 mg Oral Daily Reubin Milan, MD   1 mg at 12/06/21 1027   influenza vac split quadrivalent PF (FLUARIX) injection 0.5 mL  0.5 mL Intramuscular Tomorrow-1000 Pokhrel, Laxman, MD       LORazepam (ATIVAN) injection 0-4 mg  0-4 mg Intravenous Q6H Reubin Milan, MD       Followed by   LORazepam (ATIVAN) injection 0-4 mg  0-4 mg Intravenous Q12H Reubin Milan, MD       LORazepam (ATIVAN) tablet 1-4 mg  1-4 mg Oral Q1H PRN Reubin Milan, MD        Or   LORazepam (ATIVAN) injection 1-4 mg  1-4 mg Intravenous Q1H PRN Reubin Milan, MD       morphine (PF) 4 MG/ML injection 4 mg  4 mg Intravenous Q3H PRN Hayden Rasmussen, MD   4 mg at 12/07/21 6948   multivitamin with minerals tablet 1 tablet  1 tablet Oral Daily Reubin Milan, MD   1 tablet at 12/06/21 1027   ondansetron (ZOFRAN) tablet 4 mg  4 mg Oral Q6H PRN Reubin Milan, MD       Or   ondansetron [facility]) injection 4 mg  4 mg Intravenous Q6H PRN Reubin Milan, MD       [START ON 12/09/2021] pantoprazole (PROTONIX) injection 40 mg  40 mg Intravenous Q12H Reubin Milan, MD       pantoprazole (PROTONIX) injection 40 mg  40 mg Intravenous Once Reubin Milan, MD       pantoprozole (PROTONIX) 80 mg /NS 100 mL infusion  8 mg/hr Intravenous Continuous Reubin Milan, MD 10 mL/hr at 12/07/21 1012 8 mg/hr at 12/07/21 1012   thiamine (VITAMIN B1) tablet 100 mg  100 mg Oral Daily Reubin Milan, MD   100 mg at 12/06/21 1027   Or   thiamine (VITAMIN B1) injection 100 mg  100 mg Intravenous  Daily Reubin Milan, MD   100 mg at 12/05/21 1721   Allergies as of 12/05/2021   (No Known Allergies)   Review of Systems:  Review of Systems  Constitutional:  Positive for weight loss.  Respiratory:  Negative for shortness of breath.   Cardiovascular:  Negative for chest pain.  Gastrointestinal:  Positive for abdominal pain, diarrhea, melena, nausea and vomiting.    OBJECTIVE:   Temp:  [98.7 F (37.1 C)-99 F (37.2 C)] 99 F (37.2 C) (10/09 1506) Pulse Rate:  [50-92] 92 (10/09 1506) Resp:  [16] 16 (10/09 1506) BP: (106-109)/(55-66) 109/55 (10/09 1506) SpO2:  [99 %-100 %] 99 % (10/09 1506) Last BM Date : 12/07/21 Physical Exam Constitutional:      General: He is not in acute distress.    Appearance: He is not ill-appearing, toxic-appearing or diaphoretic.  Cardiovascular:     Rate and Rhythm: Tachycardia present. Rhythm irregular.  Pulmonary:      Effort: No respiratory distress.     Breath sounds: Normal breath sounds.  Abdominal:     General: Bowel sounds are normal. There is no distension.     Palpations: Abdomen is soft.     Tenderness: There is abdominal tenderness.  Neurological:     Mental Status: He is alert.     Labs: Recent Labs    12/05/21 1236 12/05/21 1943 12/06/21 0431 12/06/21 0800 12/07/21 0035 12/07/21 0614 12/07/21 1352  WBC 12.8*  --  10.3  --   --   --   --   HGB 12.1*   < > 9.9*   < > 10.1* 10.4* 9.9*  HCT 35.5*   < > 29.0*   < > 29.7* 30.6* 29.7*  PLT 198  --  175  --   --   --   --    < > = values in this interval not displayed.   BMET Recent Labs    12/05/21 1236 12/06/21 0431 12/07/21 0035  NA 129* 130* 131*  K 4.1 4.1 4.2  CL 94* 100 99  CO2 '27 23 25  '$ GLUCOSE 101* 82 100*  BUN 24* 15 11  CREATININE 0.91 0.65 0.87  CALCIUM 10.6* 10.0 10.6*   LFT Recent Labs    12/07/21 0035  PROT 6.5  ALBUMIN 3.2*  AST 210*  ALT 111*  ALKPHOS 162*  BILITOT 0.7   PT/INR Recent Labs    12/05/21 1236  LABPROT 12.7  INR 1.0   Diagnostic imaging: CT HEAD W & WO CONTRAST (5MM)  Result Date: 12/06/2021 CLINICAL DATA:  Metastatic disease evaluation. EXAM: CT HEAD WITHOUT AND WITH CONTRAST TECHNIQUE: Contiguous axial images were obtained from the base of the skull through the vertex without and with intravenous contrast. RADIATION DOSE REDUCTION: This exam was performed according to the departmental dose-optimization program which includes automated exposure control, adjustment of the mA and/or kV according to patient size and/or use of iterative reconstruction technique. CONTRAST:  76m OMNIPAQUE IOHEXOL 300 MG/ML  SOLN COMPARISON:  None Available. FINDINGS: Brain: No acute infarct, hemorrhage, or mass lesion is present. Postcontrast images demonstrate no pathologic enhancement. No significant white matter lesions are present. The ventricles are of normal size. No significant extraaxial fluid  collection is present. Vascular: No hyperdense vessel or unexpected calcification. Visible vessels are patent. Skull: Calvarium is intact. No focal lytic or blastic lesions are present. No significant extracranial soft tissue lesion is present. Sinuses/Orbits: The paranasal sinuses and mastoid air cells are clear. The globes and  orbits are within normal limits. IMPRESSION: Negative CT of the head without and with contrast. No evidence for metastatic disease. Electronically Signed   By: [place] M.D.   On: 12/06/2021 16:00   CT CHEST W CONTRAST  Result Date: 12/06/2021 CLINICAL DATA:  Assess for occult malignancy. Metastatic lesions noted in the liver and bones on a CT of the abdomen and pelvis, 12/05/2021. EXAM: CT CHEST WITH CONTRAST TECHNIQUE: Multidetector CT imaging of the chest was performed during intravenous contrast administration. RADIATION DOSE REDUCTION: This exam was performed according to the departmental dose-optimization program which includes automated exposure control, adjustment of the mA and/or kV according to patient size and/or use of iterative reconstruction technique. CONTRAST:  67m OMNIPAQUE IOHEXOL 300 MG/ML  SOLN COMPARISON:  CT abdomen and pelvis, 12/05/2021. Previous day's chest radiograph. FINDINGS: Cardiovascular: Heart normal in size and configuration. Left and right coronary artery calcifications. No pericardial effusion. Great vessels are normal in caliber. Minor aortic atherosclerosis. Arch branch vessels are widely patent. Mediastinum/Nodes: No enlarged mediastinal, hilar, or axillary lymph nodes. Thyroid gland, trachea, and esophagus demonstrate no significant findings. Lungs/Pleura: Minor dependent subsegmental atelectasis in the posterior lower lobes. Lungs otherwise clear. Specifically, no lung mass or nodule. No pleural effusion or pneumothorax. Upper Abdomen: No acute findings. Multiple liver masses consistent with metastatic disease as noted on the previous  day's exam. Musculoskeletal: Numerous lytic bone lesions most evident along the spine, few along the ribs, several in the sternum and several involving the scapula. No chest wall mass. IMPRESSION: 1. No evidence of a primary malignancy in the chest. 2. No evidence of lung metastatic disease or metastatic mediastinal or hilar lymphadenopathy. 3. Lytic skeletal lesions consistent with metastatic disease, as were noted on the previous day's abdomen and pelvis CT. Aortic Atherosclerosis (ICD10-I70.0). Electronically Signed   By: DLajean ManesM.D.   On: 12/06/2021 15:42    IMPRESSION: Normocytic anemia Melena Unintentional weight loss Numerous hypo-attenuating liver masses concerning for metastatic disease  Other: EtOH, cocaine use, arthritis  PLAN: - Clear liquid diet today - Attempt Gatorade/Miralax bowel preparation this afternoon - NPO at midnight for EGD and colonoscopy on 12/08/21   LOS: 2 days   CDanton Clap DO [facility]

## 2021-12-07 NOTE — Plan of Care (Signed)

## 2021-12-07 NOTE — Progress Notes (Signed)
[facility] Progress Note  SUBJECTIVE:   Interval history: Willie Parker was seen and evaluated today at bedside. Resting comfortably in bed with family at bedside. Noted 40 lb unintentional weight loss over the past 1-2 months. Having melena, some lower abdominal pain that is improving. No chest pain or shortness of breath. Feeling fatigued. Bowel preparation incomplete given intolerance to prep solution, will re-attempt bowel preparation today with Miralax/Gatorade solution and attempt EGD/COL on 12/08/21.  Past Medical History:  Diagnosis Date   Arthritis    Knee pain    Peptic ulcer    Past Surgical History:  Procedure Laterality Date   MANDIBLE FRACTURE SURGERY     when he was 66 year old   TOTAL KNEE ARTHROPLASTY Right 12/19/2017   Procedure: RIGHT TOTAL KNEE ARTHROPLASTY;  Surgeon: Leandrew Koyanagi, MD;  Location: [place];  Service: Orthopedics;  Laterality: Right;   Current Facility-Administered Medications  Medication Dose Route Frequency Provider Last Rate Last Admin   0.9 %  sodium chloride infusion   Intravenous Continuous Reubin Milan, MD 100 mL/hr at 12/07/21 0400 Infusion Verify at 12/07/21 0400   0.9 %  sodium chloride infusion   Intravenous Continuous Ronnette Juniper, MD       acetaminophen (TYLENOL) tablet 650 mg  650 mg Oral Q6H PRN Reubin Milan, MD       Or   acetaminophen (TYLENOL) suppository 650 mg  650 mg Rectal Q6H PRN Reubin Milan, MD       folic acid (FOLVITE) tablet 1 mg  1 mg Oral Daily Reubin Milan, MD   1 mg at 12/06/21 1027   influenza vac split quadrivalent PF (FLUARIX) injection 0.5 mL  0.5 mL Intramuscular Tomorrow-1000 Pokhrel, Laxman, MD       LORazepam (ATIVAN) injection 0-4 mg  0-4 mg Intravenous Q6H Reubin Milan, MD       Followed by   LORazepam (ATIVAN) injection 0-4 mg  0-4 mg Intravenous Q12H Reubin Milan, MD       LORazepam (ATIVAN) tablet 1-4 mg  1-4 mg Oral Q1H PRN Reubin Milan, MD        Or   LORazepam (ATIVAN) injection 1-4 mg  1-4 mg Intravenous Q1H PRN Reubin Milan, MD       morphine (PF) 4 MG/ML injection 4 mg  4 mg Intravenous Q3H PRN Hayden Rasmussen, MD   4 mg at 12/07/21 2423   multivitamin with minerals tablet 1 tablet  1 tablet Oral Daily Reubin Milan, MD   1 tablet at 12/06/21 1027   ondansetron (ZOFRAN) tablet 4 mg  4 mg Oral Q6H PRN Reubin Milan, MD       Or   ondansetron [facility]) injection 4 mg  4 mg Intravenous Q6H PRN Reubin Milan, MD       [START ON 12/09/2021] pantoprazole (PROTONIX) injection 40 mg  40 mg Intravenous Q12H Reubin Milan, MD       pantoprazole (PROTONIX) injection 40 mg  40 mg Intravenous Once Reubin Milan, MD       pantoprozole (PROTONIX) 80 mg /NS 100 mL infusion  8 mg/hr Intravenous Continuous Reubin Milan, MD 10 mL/hr at 12/07/21 1012 8 mg/hr at 12/07/21 1012   thiamine (VITAMIN B1) tablet 100 mg  100 mg Oral Daily Reubin Milan, MD   100 mg at 12/06/21 1027   Or   thiamine (VITAMIN B1) injection 100 mg  100 mg Intravenous  Daily Reubin Milan, MD   100 mg at 12/05/21 1721   Allergies as of 12/05/2021   (No Known Allergies)   Review of Systems:  Review of Systems  Constitutional:  Positive for weight loss.  Respiratory:  Negative for shortness of breath.   Cardiovascular:  Negative for chest pain.  Gastrointestinal:  Positive for abdominal pain, diarrhea, melena, nausea and vomiting.    OBJECTIVE:   Temp:  [98.7 F (37.1 C)-99 F (37.2 C)] 99 F (37.2 C) (10/09 1506) Pulse Rate:  [50-92] 92 (10/09 1506) Resp:  [16] 16 (10/09 1506) BP: (106-109)/(55-66) 109/55 (10/09 1506) SpO2:  [99 %-100 %] 99 % (10/09 1506) Last BM Date : 12/07/21 Physical Exam Constitutional:      General: He is not in acute distress.    Appearance: He is not ill-appearing, toxic-appearing or diaphoretic.  Cardiovascular:     Rate and Rhythm: Tachycardia present. Rhythm irregular.  Pulmonary:      Effort: No respiratory distress.     Breath sounds: Normal breath sounds.  Abdominal:     General: Bowel sounds are normal. There is no distension.     Palpations: Abdomen is soft.     Tenderness: There is abdominal tenderness.  Neurological:     Mental Status: He is alert.     Labs: Recent Labs    12/05/21 1236 12/05/21 1943 12/06/21 0431 12/06/21 0800 12/07/21 0035 12/07/21 0614 12/07/21 1352  WBC 12.8*  --  10.3  --   --   --   --   HGB 12.1*   < > 9.9*   < > 10.1* 10.4* 9.9*  HCT 35.5*   < > 29.0*   < > 29.7* 30.6* 29.7*  PLT 198  --  175  --   --   --   --    < > = values in this interval not displayed.   BMET Recent Labs    12/05/21 1236 12/06/21 0431 12/07/21 0035  NA 129* 130* 131*  K 4.1 4.1 4.2  CL 94* 100 99  CO2 '27 23 25  '$ GLUCOSE 101* 82 100*  BUN 24* 15 11  CREATININE 0.91 0.65 0.87  CALCIUM 10.6* 10.0 10.6*   LFT Recent Labs    12/07/21 0035  PROT 6.5  ALBUMIN 3.2*  AST 210*  ALT 111*  ALKPHOS 162*  BILITOT 0.7   PT/INR Recent Labs    12/05/21 1236  LABPROT 12.7  INR 1.0   Diagnostic imaging: CT HEAD W & WO CONTRAST (5MM)  Result Date: 12/06/2021 CLINICAL DATA:  Metastatic disease evaluation. EXAM: CT HEAD WITHOUT AND WITH CONTRAST TECHNIQUE: Contiguous axial images were obtained from the base of the skull through the vertex without and with intravenous contrast. RADIATION DOSE REDUCTION: This exam was performed according to the departmental dose-optimization program which includes automated exposure control, adjustment of the mA and/or kV according to patient size and/or use of iterative reconstruction technique. CONTRAST:  29m OMNIPAQUE IOHEXOL 300 MG/ML  SOLN COMPARISON:  None Available. FINDINGS: Brain: No acute infarct, hemorrhage, or mass lesion is present. Postcontrast images demonstrate no pathologic enhancement. No significant white matter lesions are present. The ventricles are of normal size. No significant extraaxial fluid  collection is present. Vascular: No hyperdense vessel or unexpected calcification. Visible vessels are patent. Skull: Calvarium is intact. No focal lytic or blastic lesions are present. No significant extracranial soft tissue lesion is present. Sinuses/Orbits: The paranasal sinuses and mastoid air cells are clear. The globes and  orbits are within normal limits. IMPRESSION: Negative CT of the head without and with contrast. No evidence for metastatic disease. Electronically Signed   By: San Morelle M.D.   On: 12/06/2021 16:00   CT CHEST W CONTRAST  Result Date: 12/06/2021 CLINICAL DATA:  Assess for occult malignancy. Metastatic lesions noted in the liver and bones on a CT of the abdomen and pelvis, 12/05/2021. EXAM: CT CHEST WITH CONTRAST TECHNIQUE: Multidetector CT imaging of the chest was performed during intravenous contrast administration. RADIATION DOSE REDUCTION: This exam was performed according to the departmental dose-optimization program which includes automated exposure control, adjustment of the mA and/or kV according to patient size and/or use of iterative reconstruction technique. CONTRAST:  4m OMNIPAQUE IOHEXOL 300 MG/ML  SOLN COMPARISON:  CT abdomen and pelvis, 12/05/2021. Previous day's chest radiograph. FINDINGS: Cardiovascular: Heart normal in size and configuration. Left and right coronary artery calcifications. No pericardial effusion. Great vessels are normal in caliber. Minor aortic atherosclerosis. Arch branch vessels are widely patent. Mediastinum/Nodes: No enlarged mediastinal, hilar, or axillary lymph nodes. Thyroid gland, trachea, and esophagus demonstrate no significant findings. Lungs/Pleura: Minor dependent subsegmental atelectasis in the posterior lower lobes. Lungs otherwise clear. Specifically, no lung mass or nodule. No pleural effusion or pneumothorax. Upper Abdomen: No acute findings. Multiple liver masses consistent with metastatic disease as noted on the previous  day's exam. Musculoskeletal: Numerous lytic bone lesions most evident along the spine, few along the ribs, several in the sternum and several involving the scapula. No chest wall mass. IMPRESSION: 1. No evidence of a primary malignancy in the chest. 2. No evidence of lung metastatic disease or metastatic mediastinal or hilar lymphadenopathy. 3. Lytic skeletal lesions consistent with metastatic disease, as were noted on the previous day's abdomen and pelvis CT. Aortic Atherosclerosis (ICD10-I70.0). Electronically Signed   By: DLajean ManesM.D.   On: 12/06/2021 15:42    IMPRESSION: Normocytic anemia Melena Unintentional weight loss Numerous hypo-attenuating liver masses concerning for metastatic disease  Other: EtOH, cocaine use, arthritis  PLAN: - Clear liquid diet today - Attempt Gatorade/Miralax bowel preparation this afternoon - NPO at midnight for EGD and colonoscopy on 12/08/21   LOS: 2 days   CDanton Clap DO [facility]

## 2021-12-07 NOTE — Progress Notes (Signed)
PROGRESS NOTE    Willie Parker  LZJ:[id number] DOB: 1955/08/09 DOA: 12/05/2021 PCP: Egbert Garibaldi, PA-C    Brief Narrative:   Willie Parker is a 66 y.o. male with past medical history significant of osteoarthritis,  peptic ulcer disease, tobacco,cocaine, alcohol use presented to the hospital with abdominal pain, 40 pound weight loss, fatigue, nausea, vomiting for last 2 weeks with an episode of hematemesis on the day of presentation.  He had been having constipation for 2 weeks or so and had melanotic bowel movements.  In the ED, patient was hemodynamically stable.  WBC was elevated at 12.8 hemoglobin 12.1 and hemoglobin 3 days back was 14.0.  Sodium was 129 creatinine of 0.9.  AST ALT and alkaline phosphatase was elevated.  Chest x-ray showed no acute disease.  CT abdomen/pelvis with contrast with numerous liver lesions as well as numerous lytic bone lesions of the spine as well as the pelvis consistent with metastatic disease.  Patient was then considered for admission to the hospital for further evaluation and treatment.    During hospitalization, patient received IV fluids.  GI followed the patient and plan is for EGD colonoscopy on 12/07/2021 to assess for hematemesis.  CT scan of the chest and head was negative..  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 GI has been consulted.  Resuscitated with IV fluids. Continue Protonix.  Latest hemoglobin of 10.4.  Lactate has improved at this time.  GI on board for EGD and colonoscopy today.  Continue Protonix drip, hold aspirin from home.  Abdominal pain, nausea, vomiting, constipation.   Liver and bone metastasis  with unintentional weight loss.  Metastatic malignancy suspected.  Lipase 29.  Likely secondary to metastatic disease.  Continue supportive care.  Continue Protonix drip.  CT chest and head scan was negative for acute findings/metastatic disease.       Abnormal LFTs Secondary to EtOH/liver metastasis.  We will continue to monitor.  INR was 1.0.  Continue CIWA protocol.  No signs of withdrawal at this time.  Hypotension.  Improved at this time.  Received IV fluid.    Hypercalcemia Likely secondary to bony metastasis.  Improved with IV fluids.  Mild hyponatremia Sodium level of 131 today..  Likely secondary to poor oral intake with history of EtOH.  Received normal saline.   Polysubstance use disorder. Patient states states that he is not drinking anymore.  On CIWA protocol.  Continue thiamine folic acid multivitamin.  Urine drug screen was positive for opiates and cocaine.  EtOH level was less than 10.  Mild leukocytosis.  Improved.  Likely reactive.  UA was negative.  Chest x-ray without any infiltrate.      DVT prophylaxis: SCDs Start: 12/05/21 1630   Code Status:     Code Status: Full Code  Disposition: Home likely in 1-2 days  Status is: Inpatient  Remains inpatient appropriate because: Need for EGD colonoscopy,   Family Communication:  Communicated with the patient's sister at bedside on 12/07/2021  Consultants:  GI  Procedures:  None yet  Antimicrobials:  None  Anti-infectives (From admission, onward)    None  Subjective: Today, patient was seen and examined at bedside.  Denies any nausea vomiting fever chills but has some weakness.  Feels hungry.  Says that his bowels are clear from preparation.  Objective: Vitals:   12/06/21 0635 12/06/21 1523 12/06/21 2100 12/07/21 0411  BP: 95/63 93/81 107/66 106/64  Pulse: 74 75 90 (!) 50  Resp: 16 20    Temp: 98.1 F (36.7 C) 98.5 F (36.9 C) 98.7 F (37.1 C) 99 F (37.2 C)  TempSrc: Oral Oral Oral Oral   SpO2: 99% 93% 100% 100%  Weight:      Height:        Intake/Output Summary (Last 24 hours) at 12/07/2021 1255 Last data filed at 12/07/2021 0900 Gross per 24 hour  Intake 2952.76 ml  Output 1775 ml  Net 1177.76 ml    Filed Weights   12/05/21 1215 12/06/21 0631  Weight: 62.6 kg 63.8 kg    Physical Examination: Body mass index is 19.08 kg/m.   General: Alert awake and Communicative, not in obvious distress, thinly built, HENT:   No scleral pallor or icterus noted. Oral mucosa is moist.  Chest:  Clear breath sounds.  Diminished breath sounds bilaterally. No crackles or wheezes.  CVS: S1 &S2 heard. No murmur.  Regular rate and rhythm. Abdomen: Soft, tenderness over the epigastric region, nondistended.  Bowel sounds are heard.   Extremities: No cyanosis, clubbing or edema.  Peripheral pulses are palpable. Psych: Alert, awake and oriented, normal mood CNS:  No cranial nerve deficits.  Power equal in all extremities.   Skin: Warm and dry.  No rashes noted.   Data Reviewed:   CBC: Recent Labs  Lab 12/05/21 1236 12/05/21 1943 12/06/21 0431 12/06/21 0800 12/06/21 1145 12/06/21 1831 12/07/21 0035 12/07/21 0614  WBC 12.8*  --  10.3  --   --   --   --   --   NEUTROABS 8.9*  --   --   --   --   --   --   --   HGB 12.1*   < > 9.9* 9.9* 9.4* 9.4* 10.1* 10.4*  HCT 35.5*   < > 29.0* 29.3* 27.6* 27.0* 29.7* 30.6*  MCV 85.5  --  86.1  --   --   --   --   --   PLT 198  --  175  --   --   --   --   --    < > = values in this interval not displayed.     Basic Metabolic Panel: Recent Labs  Lab 12/05/21 1236 12/06/21 0431 12/07/21 0035  NA 129* 130* 131*  K 4.1 4.1 4.2  CL 94* 100 99  CO2 '27 23 25  '$ GLUCOSE 101* 82 100*  BUN 24* 15 11  CREATININE 0.91 0.65 0.87  CALCIUM 10.6* 10.0 10.6*  MG  --  1.8 1.8  PHOS  --  4.1  --      Liver Function Tests: Recent Labs  Lab 12/05/21 1236 12/06/21 0431 12/07/21 0035  AST 189* 173* 210*  ALT 114* 102* 111*  ALKPHOS 175*  151* 162*  BILITOT 0.5 0.6 0.7  PROT 7.4 6.2* 6.5  ALBUMIN 3.7 3.0* 3.2*      Radiology Studies: CT HEAD W & WO CONTRAST (5MM)  Result Date: 12/06/2021 CLINICAL DATA:  Metastatic disease evaluation. EXAM: CT HEAD WITHOUT AND WITH CONTRAST TECHNIQUE: Contiguous axial images were obtained from the base of the skull through the vertex without and with  intravenous contrast. RADIATION DOSE REDUCTION: This exam was performed according to the departmental dose-optimization program which includes automated exposure control, adjustment of the mA and/or kV according to patient size and/or use of iterative reconstruction technique. CONTRAST:  71m OMNIPAQUE IOHEXOL 300 MG/ML  SOLN COMPARISON:  None Available. FINDINGS: Brain: No acute infarct, hemorrhage, or mass lesion is present. Postcontrast images demonstrate no pathologic enhancement. No significant white matter lesions are present. The ventricles are of normal size. No significant extraaxial fluid collection is present. Vascular: No hyperdense vessel or unexpected calcification. Visible vessels are patent. Skull: Calvarium is intact. No focal lytic or blastic lesions are present. No significant extracranial soft tissue lesion is present. Sinuses/Orbits: The paranasal sinuses and mastoid air cells are clear. The globes and orbits are within normal limits. IMPRESSION: Negative CT of the head without and with contrast. No evidence for metastatic disease. Electronically Signed   By: CSan MorelleM.D.   On: 12/06/2021 16:00   CT CHEST W CONTRAST  Result Date: 12/06/2021 CLINICAL DATA:  Assess for occult malignancy. Metastatic lesions noted in the liver and bones on a CT of the abdomen and pelvis, 12/05/2021. EXAM: CT CHEST WITH CONTRAST TECHNIQUE: Multidetector CT imaging of the chest was performed during intravenous contrast administration. RADIATION DOSE REDUCTION: This exam was performed according to the departmental dose-optimization program which  includes automated exposure control, adjustment of the mA and/or kV according to patient size and/or use of iterative reconstruction technique. CONTRAST:  767mOMNIPAQUE IOHEXOL 300 MG/ML  SOLN COMPARISON:  CT abdomen and pelvis, 12/05/2021. Previous day's chest radiograph. FINDINGS: Cardiovascular: Heart normal in size and configuration. Left and right coronary artery calcifications. No pericardial effusion. Great vessels are normal in caliber. Minor aortic atherosclerosis. Arch branch vessels are widely patent. Mediastinum/Nodes: No enlarged mediastinal, hilar, or axillary lymph nodes. Thyroid gland, trachea, and esophagus demonstrate no significant findings. Lungs/Pleura: Minor dependent subsegmental atelectasis in the posterior lower lobes. Lungs otherwise clear. Specifically, no lung mass or nodule. No pleural effusion or pneumothorax. Upper Abdomen: No acute findings. Multiple liver masses consistent with metastatic disease as noted on the previous day's exam. Musculoskeletal: Numerous lytic bone lesions most evident along the spine, few along the ribs, several in the sternum and several involving the scapula. No chest wall mass. IMPRESSION: 1. No evidence of a primary malignancy in the chest. 2. No evidence of lung metastatic disease or metastatic mediastinal or hilar lymphadenopathy. 3. Lytic skeletal lesions consistent with metastatic disease, as were noted on the previous day's abdomen and pelvis CT. Aortic Atherosclerosis (ICD10-I70.0). Electronically Signed   By: DaLajean Manes.D.   On: 12/06/2021 15:42   CT Abdomen Pelvis W Contrast  Result Date: 12/05/2021 CLINICAL DATA:  Nausea vomiting.  Abdominal pain. EXAM: CT ABDOMEN AND PELVIS WITH CONTRAST TECHNIQUE: Multidetector CT imaging of the abdomen and pelvis was performed using the standard protocol following bolus administration of intravenous contrast. RADIATION DOSE REDUCTION: This exam was performed according to the departmental  dose-optimization program which includes automated exposure control, adjustment of the mA and/or kV according to patient size and/or use of iterative reconstruction technique. CONTRAST:  10043mMNIPAQUE IOHEXOL 300 MG/ML  SOLN COMPARISON:  05/30/2021. FINDINGS: Lower chest: Clear lung bases. Hepatobiliary: Numerous hypoattenuating liver masses that are new from the prior CT, largest in the right lobe, segment 7, 4.7 cm. Possible small gallstone. No acute cholecystitis. No bile duct dilation. Pancreas: Unremarkable. No pancreatic ductal dilatation or surrounding inflammatory changes. Spleen: Normal in size without focal abnormality. Adrenals/Urinary  Tract: Normal adrenal glands. Kidneys normal size, orientation and position with symmetric enhancement and excretion. Mid to upper pole left renal cyst, 1.1 cm. No follow-up recommended. No other renal masses, no stones and no hydronephrosis. Normal ureters. Bladder is unremarkable. Stomach/Bowel: Stomach is mostly decompressed, otherwise unremarkable. Small bowel is normal in caliber. No wall thickening or inflammation. Stomach is mildly distended, mostly along the ascending and transverse portions, with moderate increased stool burden. No colonic wall thickening. No inflammatory changes. No evidence of a mass. Normal appendix visualized. Vascular/Lymphatic: Aortic atherosclerosis. No aneurysm. Poorly defined enlarged porta hepatis lymph nodes, up to 1.4 cm in short axis. Reproductive: Enlarged prostate, 4.5 x 4.4 cm. Other: No ascites. Musculoskeletal: Numerous lytic bone lesions throughout the visualized spine, as well as the pelvis. No fracture. IMPRESSION: 1. Numerous liver lesions as well as numerous lytic bone lesions consistent with metastatic disease. Primary malignancy not defined on this exam. 2. No acute findings. 3. Moderate increase in the colonic stool burden. 4. Aortic atherosclerosis. Electronically Signed   By: Lajean Manes M.D.   On: 12/05/2021 14:40       LOS: 2 days    Flora Lipps, MD [facility] Available via Epic secure chat 7am-7pm After these hours, please refer to coverage provider listed on [email or web address] 12/07/2021, 12:55 PM

## 2021-12-08 ENCOUNTER — Inpatient Hospital Stay (HOSPITAL_COMMUNITY): Payer: Medicare Other | Admitting: Certified Registered Nurse Anesthetist

## 2021-12-08 ENCOUNTER — Encounter (HOSPITAL_COMMUNITY): Payer: Self-pay

## 2021-12-08 ENCOUNTER — Encounter (HOSPITAL_COMMUNITY)
Admission: EM | Disposition: A | Discharge status: Home / Self Care | Payer: Self-pay | Source: Home / Self Care | Attending: Internal Medicine

## 2021-12-08 DIAGNOSIS — K259 Gastric ulcer, unspecified as acute or chronic, without hemorrhage or perforation: Secondary | ICD-10-CM

## 2021-12-08 DIAGNOSIS — D62 Acute posthemorrhagic anemia: Secondary | ICD-10-CM | POA: Diagnosis not present

## 2021-12-08 DIAGNOSIS — K635 Polyp of colon: Secondary | ICD-10-CM

## 2021-12-08 DIAGNOSIS — K5730 Diverticulosis of large intestine without perforation or abscess without bleeding: Secondary | ICD-10-CM

## 2021-12-08 DIAGNOSIS — F17210 Nicotine dependence, cigarettes, uncomplicated: Secondary | ICD-10-CM

## 2021-12-08 DIAGNOSIS — K649 Unspecified hemorrhoids: Secondary | ICD-10-CM

## 2021-12-08 DIAGNOSIS — E440 Moderate protein-calorie malnutrition: Secondary | ICD-10-CM | POA: Insufficient documentation

## 2021-12-08 DIAGNOSIS — K920 Hematemesis: Secondary | ICD-10-CM | POA: Diagnosis not present

## 2021-12-08 DIAGNOSIS — F1010 Alcohol abuse, uncomplicated: Secondary | ICD-10-CM | POA: Diagnosis not present

## 2021-12-08 DIAGNOSIS — K2970 Gastritis, unspecified, without bleeding: Secondary | ICD-10-CM

## 2021-12-08 DIAGNOSIS — R7989 Other specified abnormal findings of blood chemistry: Secondary | ICD-10-CM | POA: Diagnosis not present

## 2021-12-08 HISTORY — PX: COLONOSCOPY: SHX5424

## 2021-12-08 HISTORY — PX: HOT HEMOSTASIS: SHX5433

## 2021-12-08 HISTORY — PX: ESOPHAGOGASTRODUODENOSCOPY (EGD) WITH PROPOFOL: SHX5813

## 2021-12-08 HISTORY — PX: BIOPSY: SHX5522

## 2021-12-08 HISTORY — PX: POLYPECTOMY: SHX5525

## 2021-12-08 LAB — HEMOGLOBIN AND HEMATOCRIT, BLOOD
HCT: 30.6 % — ABNORMAL LOW (ref 39.0–52.0)
HCT: 30.8 % — ABNORMAL LOW (ref 39.0–52.0)
Hemoglobin: 10.2 g/dL — ABNORMAL LOW (ref 13.0–17.0)
Hemoglobin: 10.5 g/dL — ABNORMAL LOW (ref 13.0–17.0)

## 2021-12-08 SURGERY — ESOPHAGOGASTRODUODENOSCOPY (EGD) WITH PROPOFOL
Anesthesia: Monitor Anesthesia Care | Laterality: Left

## 2021-12-08 MED ORDER — PANTOPRAZOLE SODIUM 40 MG IV SOLR: 40.0000 mg | Freq: Two times a day (BID) | INTRAVENOUS | Status: DC

## 2021-12-08 MED ORDER — PROPOFOL 10 MG/ML IV BOLUS
INTRAVENOUS | Status: DC | PRN
  Administered 2021-12-08 (×2): 20 mg via INTRAVENOUS
  Administered 2021-12-08: 50 mg via INTRAVENOUS
  Administered 2021-12-08: 20 mg via INTRAVENOUS

## 2021-12-08 MED ORDER — EPHEDRINE SULFATE-NACL 50-0.9 MG/10ML-% IV SOSY
PREFILLED_SYRINGE | INTRAVENOUS | Status: DC | PRN
  Administered 2021-12-08: 5 mg via INTRAVENOUS
  Administered 2021-12-08: 10 mg via INTRAVENOUS
  Administered 2021-12-08 (×2): 7.5 mg via INTRAVENOUS
  Administered 2021-12-08: 5 mg via INTRAVENOUS

## 2021-12-08 MED ORDER — FLEET ENEMA 7-19 GM/118ML RE ENEM
1.0000 | ENEMA | Freq: Once | RECTAL | Status: AC
  Administered 2021-12-08: 1 via RECTAL
  Filled 2021-12-08 (×2): qty 1

## 2021-12-08 MED ORDER — PHENYLEPHRINE 80 MCG/ML (10ML) SYRINGE FOR IV PUSH (FOR BLOOD PRESSURE SUPPORT)
PREFILLED_SYRINGE | INTRAVENOUS | Status: DC | PRN
  Administered 2021-12-08: 80 ug via INTRAVENOUS
  Administered 2021-12-08: 160 ug via INTRAVENOUS
  Administered 2021-12-08: 80 ug via INTRAVENOUS
  Administered 2021-12-08 (×3): 120 ug via INTRAVENOUS
  Administered 2021-12-08: 160 ug via INTRAVENOUS
  Administered 2021-12-08: 120 ug via INTRAVENOUS
  Administered 2021-12-08: 80 ug via INTRAVENOUS
  Administered 2021-12-08: 160 ug via INTRAVENOUS
  Administered 2021-12-08: 120 ug via INTRAVENOUS

## 2021-12-08 MED ORDER — ALBUMIN HUMAN 5 % IV SOLN: INTRAVENOUS | Status: DC | PRN

## 2021-12-08 MED ORDER — LACTATED RINGERS IV SOLN: INTRAVENOUS | Status: DC

## 2021-12-08 MED ORDER — ENSURE ENLIVE PO LIQD
237.0000 mL | Freq: Two times a day (BID) | ORAL | Status: DC
  Administered 2021-12-08 – 2021-12-11 (×5): 237 mL via ORAL

## 2021-12-08 MED ORDER — PROPOFOL 500 MG/50ML IV EMUL
INTRAVENOUS | Status: DC | PRN
  Administered 2021-12-08: 150 ug/kg/min via INTRAVENOUS

## 2021-12-08 MED ORDER — LIDOCAINE 2% (20 MG/ML) 5 ML SYRINGE
INTRAMUSCULAR | Status: DC | PRN
  Administered 2021-12-08: 80 mg via INTRAVENOUS

## 2021-12-08 NOTE — Interval H&P Note (Signed)
History and Physical Interval Note:  12/08/2021 1:09 PM  Willie Parker  has presented today for surgery, with the diagnosis of weight loss,abnormal ct-metastatic disease, coffee ground emesis, melena,constipation.  The various methods of treatment have been discussed with the patient and family. After consideration of risks, benefits and other options for treatment, the patient has consented to  Procedure(s): ESOPHAGOGASTRODUODENOSCOPY (EGD) WITH PROPOFOL (Left) COLONOSCOPY (Left) as a surgical intervention.  The patient's history has been reviewed, patient examined, no change in status, stable for surgery.  I have reviewed the patient's chart and labs.  Questions were answered to the patient's satisfaction.     Loney Laurence

## 2021-12-08 NOTE — Anesthesia Preprocedure Evaluation (Addendum)
Anesthesia Evaluation  Patient identified by MRN, date of birth, ID band Patient awake    Reviewed: Allergy & Precautions, NPO status , Patient's Chart, lab work & pertinent test results  History of Anesthesia Complications Negative for: history of anesthetic complications  Airway Mallampati: I  TM Distance: >3 FB Neck ROM: Full    Dental  (+) Missing, Dental Advisory Given, Edentulous Upper, Partial Lower   Pulmonary Current Smoker,    Pulmonary exam normal        Cardiovascular negative cardio ROS Normal cardiovascular exam     Neuro/Psych negative neurological ROS     GI/Hepatic Neg liver ROS, PUD, Liver metastasis   Endo/Other  negative endocrine ROS  Renal/GU negative Renal ROS     Musculoskeletal  (+) Arthritis , Bone metastasis   Abdominal   Peds  Hematology  (+) Blood dyscrasia, anemia ,   Anesthesia Other Findings   Reproductive/Obstetrics                            Anesthesia Physical Anesthesia Plan  ASA: 3  Anesthesia Plan: MAC   Post-op Pain Management: Minimal or no pain anticipated   Induction:   PONV Risk Score and Plan: Propofol infusion  Airway Management Planned: Natural Airway  Additional Equipment:   Intra-op Plan:   Post-operative Plan:   Informed Consent: I have reviewed the patients History and Physical, chart, labs and discussed the procedure including the risks, benefits and alternatives for the proposed anesthesia with the patient or authorized representative who has indicated his/her understanding and acceptance.     Dental advisory given  Plan Discussed with: CRNA and Anesthesiologist  Anesthesia Plan Comments:         Anesthesia Quick Evaluation

## 2021-12-08 NOTE — Anesthesia Postprocedure Evaluation (Addendum)
Anesthesia Post Note  Patient: Willie Parker  Procedure(s) Performed: ESOPHAGOGASTRODUODENOSCOPY (EGD) WITH PROPOFOL (Left) COLONOSCOPY (Left) BIOPSY HOT HEMOSTASIS (ARGON PLASMA COAGULATION/BICAP) POLYPECTOMY     Patient location during evaluation: Endoscopy Anesthesia Type: MAC Level of consciousness: awake and alert Pain management: pain level controlled Vital Signs Assessment: post-procedure vital signs reviewed and stable Respiratory status: spontaneous breathing and respiratory function stable Cardiovascular status: stable Postop Assessment: no apparent nausea or vomiting Anesthetic complications: no   No notable events documented.  Last Vitals:  Vitals:   12/08/21 1445 12/08/21 1450  BP: (!) 102/54 108/64  Pulse: 87 79  Resp: 16 19  Temp:    SpO2: 100% 96%    Last Pain:  Vitals:   12/08/21 1450  TempSrc:   PainSc: 0-No pain                 Precious Segall DANIEL

## 2021-12-08 NOTE — Op Note (Addendum)
[facility] Patient Name: Willie Parker Procedure Date: 12/08/2021 MRN: [id number] Attending MD: Danton Clap DO, DO Date of Birth: 10-03-1955 CSN: [id number] Age: 66 Admit Type: Outpatient Procedure:                Upper GI endoscopy Indications:              Generalized abdominal pain, Melena, Abnormal CT of                            the GI tract, Weight loss Providers:                Danton Clap DO, DO, Orvil Feil, RN, Frazier Richards, Technician Referring MD:              Medicines:                See the Anesthesia note for documentation of the                            administered medications Complications:            No immediate complications. Estimated Blood Loss:     Estimated blood loss was minimal. Procedure:                Pre-Anesthesia Assessment:                           - ASA Grade Assessment: III - A patient with severe                            systemic disease.                           - After reviewing the risks and benefits, the                            patient was deemed in satisfactory condition to                            undergo the procedure.                           After obtaining informed consent, the endoscope was                            passed under direct vision. Throughout the                            procedure, the patient's blood pressure, pulse, and                            oxygen saturations were monitored continuously. The                            [id number]-H190 ([id number]) Olympus endoscope was introduced  through the mouth, and advanced to the second part                            of duodenum. The upper GI endoscopy was                            accomplished without difficulty. The patient                            tolerated the procedure well. Scope In: Scope Out: Findings:      The examined esophagus was normal.      Localized mild inflammation  characterized by erythema was found in the       gastric antrum. Biopsies were taken with a cold forceps for Helicobacter       pylori testing.      One non-bleeding cratered gastric ulcer with pigmented material was       found at the incisura. The lesion was 23 mm in largest dimension.       Coagulation for bleeding prevention using bipolar probe was successful.       Estimated blood loss was minimal. Biopsies were taken with a cold       forceps for histology.      The examined duodenum was normal. Impression:               - Normal esophagus.                           - Gastritis. Biopsied.                           - Non-bleeding gastric ulcer with pigmented                            material. Treated with bipolar cautery.                           - Normal examined duodenum. Moderate Sedation:      Moderate (conscious) sedation was personally administered by an       anesthesia professional. The following parameters were monitored: oxygen       saturation, heart rate, blood pressure, and response to care. Recommendation:           - Soft diet for 2 days.                           - Await pathology results.                           - Repeat upper endoscopy in 6 weeks to check                            healing. Procedure Code(s):        --- Professional ---                           [phone], Esophagogastroduodenoscopy, flexible,  transoral; with biopsy, single or multiple Diagnosis Code(s):        --- Professional ---                           K29.70, Gastritis, unspecified, without bleeding                           K25.9, Gastric ulcer, unspecified as acute or                            chronic, without hemorrhage or perforation                           R10.84, Generalized abdominal pain                           K92.1, Melena (includes Hematochezia)                           R63.4, Abnormal weight loss                           R93.3, Abnormal  findings on diagnostic imaging of                            other parts of digestive tract CPT copyright 2019 [employer]. All rights reserved. The codes documented in this report are preliminary and upon coder review may  be revised to meet current compliance requirements. Dr Danton Clap, [place], DO 12/08/2021 2:37:03 PM Number of Addenda: 0

## 2021-12-08 NOTE — Transfer of Care (Signed)
Immediate Anesthesia Transfer of Care Note  Patient: Marshaun Lortie  Procedure(s) Performed: ESOPHAGOGASTRODUODENOSCOPY (EGD) WITH PROPOFOL (Left) COLONOSCOPY (Left) BIOPSY HOT HEMOSTASIS (ARGON PLASMA COAGULATION/BICAP) POLYPECTOMY  Patient Location: PACU  Anesthesia Type:MAC  Level of Consciousness: drowsy  Airway & Oxygen Therapy: Patient Spontanous Breathing and Patient connected to face mask oxygen  Post-op Assessment: Report given to RN and Post -op Vital signs reviewed and stable  Post vital signs: Reviewed and stable.  Patient back to pre-procedural VS  Last Vitals:  Vitals Value Taken Time  BP 84/37 12/08/21 1428  Temp 36.2 C 12/08/21 1428  Pulse 80 12/08/21 1429  Resp 19 12/08/21 1429  SpO2 100 % 12/08/21 1429  Vitals shown include unvalidated device data.  Last Pain:  Vitals:   12/08/21 1428  TempSrc: Temporal  PainSc:       Patients Stated Pain Goal: 2 (97/67/34 1937)  Complications: No notable events documented.

## 2021-12-08 NOTE — Op Note (Addendum)
[facility] Patient Name: Willie Parker Procedure Date: 12/08/2021 MRN: [id number] Attending MD: Danton Clap DO, DO Date of Birth: August 06, 1955 CSN: [id number] Age: 66 Admit Type: Outpatient Procedure:                Colonoscopy Indications:              Melena, Suspected metastatic malignancy to liver,                            Weight loss Providers:                Danton Clap DO, DO, Orvil Feil, RN, Frazier Richards, Technician Referring MD:              Medicines:                See the Anesthesia note for documentation of the                            administered medications Complications:            No immediate complications. Estimated blood loss:                            Minimal. Estimated Blood Loss:     Estimated blood loss was minimal. Estimated blood                            loss was minimal. Procedure:                Pre-Anesthesia Assessment:                           - ASA Grade Assessment: III - A patient with severe                            systemic disease.                           - The anesthesia plan was to use moderate                            sedation/analgesia (conscious sedation).                           After obtaining informed consent, the colonoscope                            was passed under direct vision. Throughout the                            procedure, the patient's blood pressure, pulse, and                            oxygen saturations were monitored continuously. The  CF-HQ190L ([id number]) Olympus colonoscope was                            introduced through the anus and advanced to the the                            cecum, identified by appendiceal orifice and                            ileocecal valve. The colonoscopy was performed                            without difficulty. The patient tolerated the                            procedure well. The  quality of the bowel                            preparation was fair. Scope In: 1:51:54 PM Scope Out: 2:21:34 PM Scope Withdrawal Time: 0 hours 16 minutes 29 seconds  Total Procedure Duration: 0 hours 29 minutes 40 seconds  Findings:      A 4 mm polyp was found in the descending colon. The polyp was sessile.       The polyp was removed with a cold snare. Resection and retrieval were       complete.      A 5 mm polyp was found in the transverse colon. The polyp was sessile.       The polyp was removed with a cold snare. Resection and retrieval were       complete.      The terminal ileum appeared normal.      A few small-mouthed diverticula were found in the left colon.      Hemorrhoids were found on perianal exam. Impression:               - Preparation of the colon was fair.                           - One 4 mm polyp in the descending colon, removed                            with a cold snare. Resected and retrieved.                           - One 5 mm polyp in the transverse colon, removed                            with a cold snare. Resected and retrieved.                           - The examined portion of the ileum was normal.                           - Diverticulosis in the left colon.                           -  Hemorrhoids found on perianal exam. Moderate Sedation:      Moderate (conscious) sedation was personally administered by an       anesthesia professional. The following parameters were monitored: oxygen       saturation, heart rate, blood pressure, and response to care. Recommendation:           - Soft diet for 2 days.                           - Continue present medications.                           - Repeat colonoscopy date to be determined after                            pending pathology results are reviewed for                            surveillance based on pathology results. Procedure Code(s):        --- Professional ---                           [phone],  Colonoscopy, flexible; with removal of                            tumor(s), polyp(s), or other lesion(s) by snare                            technique Diagnosis Code(s):        --- Professional ---                           K64.9, Unspecified hemorrhoids                           K63.5, Polyp of colon                           K92.1, Melena (includes Hematochezia)                           R63.4, Abnormal weight loss                           K57.30, Diverticulosis of large intestine without                            perforation or abscess without bleeding CPT copyright 2019 American Medical Association. All rights reserved. The codes documented in this report are preliminary and upon coder review may  be revised to meet current compliance requirements. Dr Danton Clap, DO Danton Clap DO, DO 12/08/2021 2:43:45 PM Number of Addenda: 0

## 2021-12-08 NOTE — Progress Notes (Signed)
Initial Nutrition Assessment  DOCUMENTATION CODES:   Non-severe (moderate) malnutrition in context of chronic illness  INTERVENTION:   Once diet advanced: -Recommend Dysphagia 3 diet given poor dentition -Ensure Plus High Protein po BID, each supplement provides 350 kcal and 20 grams of protein.   NUTRITION DIAGNOSIS:   Moderate Malnutrition related to chronic illness (substance abuse) as evidenced by energy intake < or equal to 75% for > or equal to 1 month, moderate fat depletion, moderate muscle depletion.  GOAL:   Patient will meet greater than or equal to 90% of their needs  MONITOR:   PO intake, Supplement acceptance, Labs, Weight trends, I & O's, Diet advancement  REASON FOR ASSESSMENT:   Malnutrition Screening Tool    ASSESSMENT:   66 y.o. male with past medical history significant of osteoarthritis,  peptic ulcer disease, tobacco,cocaine, alcohol use presented to the hospital with abdominal pain, 40 pound weight loss, fatigue, nausea, vomiting for last 2 weeks with an episode of hematemesis on the day of presentation.  He had been having constipation for 2 weeks or so and had melanotic bowel movements.  Patient currently NPO awaiting EGD/colonoscopy today. Pt reports he has been eating poorly for 3 weeks now d/t early satiety and N/V. Pt with history of alcohol and cocaine use. Pt with new liver and bone mets. Unknown primary at this time. Pt states that prior to symptoms onset, pt was only consuming 1 meal a day. But would consumes meats, starches, very little fruits or vegetables.  Has some difficulty chewing foods so is agreeable to chopped diet once diet is advanced. Will order Ensure supplements following diet advancement.  Per pt, UBW is ~180 lbs. Current weight: 140 lbs.  Medications: Folic acid, Thiamine  Labs reviewed.  NUTRITION - FOCUSED PHYSICAL EXAM:  Flowsheet Row Most Recent Value  Orbital Region Moderate depletion  Upper Arm Region Moderate  depletion  Thoracic and Lumbar Region Mild depletion  Buccal Region Severe depletion  Temple Region Severe depletion  Clavicle Bone Region Moderate depletion  Clavicle and Acromion Bone Region Moderate depletion  Scapular Bone Region Moderate depletion  Dorsal Hand Mild depletion  Patellar Region Moderate depletion  Anterior Thigh Region Moderate depletion  Posterior Calf Region Moderate depletion  Edema (RD Assessment) None  Hair Unable to assess  Eyes Reviewed  Mouth Reviewed  [poor dentition]  Skin Reviewed  Nails Reviewed       Diet Order:   Diet Order             Diet clear liquid Room service appropriate? Yes; Fluid consistency: Thin  Diet effective now                   EDUCATION NEEDS:   No education needs have been identified at this time  Skin:  Skin Assessment: Reviewed RN Assessment  Last BM:  10/10  Height:   Ht Readings from Last 1 Encounters:  12/06/21 6' (1.829 m)    Weight:   Wt Readings from Last 1 Encounters:  12/06/21 63.8 kg    BMI:  Body mass index is 19.08 kg/m.  Estimated Nutritional Needs:   Kcal:  1900-2100  Protein:  95-110g  Fluid:  2L/day  Clayton Bibles, MS, RD, LDN Inpatient Clinical Dietitian Contact information available via Amion

## 2021-12-08 NOTE — Progress Notes (Signed)
Fleets enema administered as per order.  Patient with large amount of dark brown liquid.

## 2021-12-08 NOTE — Progress Notes (Addendum)
PROGRESS NOTE    Willie Parker  NAT:[id number] DOB: 1955-10-13 DOA: 12/05/2021 PCP: Egbert Garibaldi, PA-C    Brief Narrative:   Willie Parker is a 66 y.o. male with past medical history significant of osteoarthritis,  peptic ulcer disease, tobacco,cocaine, alcohol use presented to the hospital with abdominal pain, 40 pound weight loss, fatigue, nausea, vomiting for last 2 weeks with an episode of hematemesis on the day of presentation.  He had been having constipation for 2 weeks or so and had melanotic bowel movements.  In the ED, patient was hemodynamically stable.  WBC was elevated at 12.8 ,hemoglobin 12.1 and hemoglobin 3 days prior to presentation was 14.0.  Sodium was 129 creatinine of 0.9.  AST ALT and alkaline phosphatase was elevated.  Chest x-ray showed no acute disease.  CT abdomen/pelvis with contrast with numerous liver lesions as well as numerous lytic bone lesions of the spine as well as the pelvis consistent with metastatic disease.  Patient was then considered for admission to the hospital for further evaluation and treatment.    During hospitalization, patient received IV fluids.   CT scan of the chest and head was negative..GI followed the patient and plan is for EGD colonoscopy on 12/08/2021 to assess for hematemesis.   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 GI on board..  Resuscitated with IV fluids, continue Protonix.  Latest hemoglobin of 10.5.  Lactate has improved at this time.  GI on board for EGD and colonoscopy today.  Continue Protonix drip, hold aspirin from home.  Abdominal pain, nausea, vomiting, constipation.   Liver and bone  metastasis with unintentional weight loss.  Metastatic malignancy suspected.  Lipase 29.  Likely secondary to metastatic disease.  Continue supportive care.  Continue Protonix drip.  CT chest and head scan was negative for acute findings/metastatic disease.  Awaiting for EGD colonoscopy today, might need liver biopsy/tissue biopsy if EGD colonoscopy negative.     Abnormal LFTs Secondary to EtOH/liver metastasis.  We will continue to monitor.  INR was 1.0.  Continue CIWA protocol.  No signs of withdrawal at this time.  Hypotension.  Improved at this time.  Received IV fluid.    Hypercalcemia Likely secondary to bony metastasis.  Improved with IV fluids.  Latest calcium of 10.6  Mild hyponatremia Latest sodium of 131..  Likely secondary to poor oral intake with history of EtOH.  Received normal saline.  Check BMP in a.m.  Polysubstance use disorder. Patient states states that he is not drinking anymore.  On CIWA protocol.  Continue thiamine folic acid multivitamin.  Urine drug screen was positive for opiates and cocaine.  EtOH level was less than 10.  Mild leukocytosis.  Improved.  Likely reactive.  UA was negative.  Chest x-ray without any infiltrate.      DVT prophylaxis: SCDs Start: 12/05/21 1630   Code Status:     Code Status: Full Code  Disposition: Home likely in 1-2 days, awaiting for EGD colonoscopy  Status is: Inpatient  Remains inpatient appropriate because: Need for EGD/ colonoscopy, possible biopsy   Family Communication:  Communicated with the patient's sister at bedside on  12/07/2021  Consultants:  GI  Procedures:  None yet  Antimicrobials:  None  Anti-infectives (From admission, onward)    None       Subjective: Today, patient was seen and examined at bedside.  Awaiting for EGD colonoscopy.  Has been going to the bathroom with bowel preparation.  Complains of mild epigastric discomfort.  Objective: Vitals:   12/06/21 1523 12/06/21 2100 12/07/21  0411 12/07/21 1506  BP: 93/81 107/66 106/64 (!) 109/55  Pulse: 75 90 (!) 50 92  Resp: 20   16  Temp: 98.5 F (36.9 C) 98.7 F (37.1 C) 99 F (37.2 C) 99 F (37.2 C)  TempSrc: Oral Oral Oral Oral  SpO2: 93% 100% 100% 99%  Weight:      Height:        Intake/Output Summary (Last 24 hours) at 12/08/2021 1043 Last data filed at 12/08/2021 0425 Gross per 24 hour  Intake 4656.6 ml  Output 875 ml  Net 3781.6 ml    Filed Weights   12/05/21 1215 12/06/21 0631  Weight: 62.6 kg 63.8 kg    Physical Examination: Body mass index is 19.08 kg/m.   General: Thinly built, not in obvious distress HENT:   No scleral pallor or icterus noted. Oral mucosa is moist.  Chest:  Clear breath sounds.  Diminished breath sounds bilaterally. No crackles or wheezes.  CVS: S1 &S2 heard. No murmur.  Regular rate and rhythm. Abdomen: Soft, mild tenderness over the epigastric region, nondistended.  Bowel sounds are heard.   Extremities: No cyanosis, clubbing or edema.  Peripheral pulses are palpable. Psych: Alert, awake and oriented, normal mood CNS:  No cranial nerve deficits.  Power equal in all extremities.   Skin: Warm and dry.  No rashes noted.   Data Reviewed:   CBC: Recent Labs  Lab 12/05/21 1236 12/05/21 1943 12/06/21 0431 12/06/21 0800 12/07/21 0614 12/07/21 1352 12/07/21 1730 12/08/21 0019 12/08/21 0616  WBC 12.8*  --  10.3  --   --   --   --   --   --   NEUTROABS 8.9*  --   --   --   --   --   --   --   --   HGB 12.1*   < > 9.9*   < > 10.4* 9.9* 8.2* 10.2* 10.5*  HCT 35.5*   < > 29.0*   < > 30.6* 29.7* 24.1* 30.6* 30.8*  MCV 85.5  --  86.1  --   --   --   --   --   --   PLT 198  --  175  --   --   --   --   --   --    < > = values in this interval not displayed.     Basic Metabolic Panel: Recent Labs  Lab 12/05/21 1236 12/06/21 0431 12/07/21 0035  NA 129* 130* 131*  K 4.1 4.1 4.2  CL 94* 100 99  CO2 '27 23 25  '$ GLUCOSE 101* 82 100*  BUN 24* 15 11  CREATININE 0.91  0.65 0.87  CALCIUM 10.6* 10.0 10.6*  MG  --  1.8 1.8  PHOS  --  4.1  --      Liver Function Tests: Recent Labs  Lab 12/05/21 1236 12/06/21 0431 12/07/21 0035  AST 189* 173* 210*  ALT 114* 102* 111*  ALKPHOS 175* 151* 162*  BILITOT 0.5 0.6 0.7  PROT 7.4 6.2* 6.5  ALBUMIN 3.7 3.0* 3.2*  Radiology Studies: CT HEAD W & WO CONTRAST (5MM)  Result Date: 12/06/2021 CLINICAL DATA:  Metastatic disease evaluation. EXAM: CT HEAD WITHOUT AND WITH CONTRAST TECHNIQUE: Contiguous axial images were obtained from the base of the skull through the vertex without and with intravenous contrast. RADIATION DOSE REDUCTION: This exam was performed according to the departmental dose-optimization program which includes automated exposure control, adjustment of the mA and/or kV according to patient size and/or use of iterative reconstruction technique. CONTRAST:  54m OMNIPAQUE IOHEXOL 300 MG/ML  SOLN COMPARISON:  None Available. FINDINGS: Brain: No acute infarct, hemorrhage, or mass lesion is present. Postcontrast images demonstrate no pathologic enhancement. No significant white matter lesions are present. The ventricles are of normal size. No significant extraaxial fluid collection is present. Vascular: No hyperdense vessel or unexpected calcification. Visible vessels are patent. Skull: Calvarium is intact. No focal lytic or blastic lesions are present. No significant extracranial soft tissue lesion is present. Sinuses/Orbits: The paranasal sinuses and mastoid air cells are clear. The globes and orbits are within normal limits. IMPRESSION: Negative CT of the head without and with contrast. No evidence for metastatic disease. Electronically Signed   By: CSan MorelleM.D.   On: 12/06/2021 16:00   CT CHEST W CONTRAST  Result Date: 12/06/2021 CLINICAL DATA:  Assess for occult malignancy. Metastatic lesions noted in the liver and bones on a CT of the abdomen and pelvis, 12/05/2021. EXAM: CT CHEST WITH  CONTRAST TECHNIQUE: Multidetector CT imaging of the chest was performed during intravenous contrast administration. RADIATION DOSE REDUCTION: This exam was performed according to the departmental dose-optimization program which includes automated exposure control, adjustment of the mA and/or kV according to patient size and/or use of iterative reconstruction technique. CONTRAST:  770mOMNIPAQUE IOHEXOL 300 MG/ML  SOLN COMPARISON:  CT abdomen and pelvis, 12/05/2021. Previous day's chest radiograph. FINDINGS: Cardiovascular: Heart normal in size and configuration. Left and right coronary artery calcifications. No pericardial effusion. Great vessels are normal in caliber. Minor aortic atherosclerosis. Arch branch vessels are widely patent. Mediastinum/Nodes: No enlarged mediastinal, hilar, or axillary lymph nodes. Thyroid gland, trachea, and esophagus demonstrate no significant findings. Lungs/Pleura: Minor dependent subsegmental atelectasis in the posterior lower lobes. Lungs otherwise clear. Specifically, no lung mass or nodule. No pleural effusion or pneumothorax. Upper Abdomen: No acute findings. Multiple liver masses consistent with metastatic disease as noted on the previous day's exam. Musculoskeletal: Numerous lytic bone lesions most evident along the spine, few along the ribs, several in the sternum and several involving the scapula. No chest wall mass. IMPRESSION: 1. No evidence of a primary malignancy in the chest. 2. No evidence of lung metastatic disease or metastatic mediastinal or hilar lymphadenopathy. 3. Lytic skeletal lesions consistent with metastatic disease, as were noted on the previous day's abdomen and pelvis CT. Aortic Atherosclerosis (ICD10-I70.0). Electronically Signed   By: DaLajean Manes.D.   On: 12/06/2021 15:42      LOS: 3 days    LaFlora LippsMD [facility] Available via Epic secure chat 7am-7pm After these hours, please refer to coverage provider listed on  [email or web address] 12/08/2021, 10:43 AM

## 2021-12-08 NOTE — Addendum Note (Signed)
Addendum  created 12/08/21 1501 by Duane Boston, MD   Clinical Note Signed

## 2021-12-09 DIAGNOSIS — K920 Hematemesis: Secondary | ICD-10-CM | POA: Diagnosis not present

## 2021-12-09 LAB — COMPREHENSIVE METABOLIC PANEL
ALT: 93 U/L — ABNORMAL HIGH (ref 0–44)
AST: 163 U/L — ABNORMAL HIGH (ref 15–41)
Albumin: 2.7 g/dL — ABNORMAL LOW (ref 3.5–5.0)
Alkaline Phosphatase: 134 U/L — ABNORMAL HIGH (ref 38–126)
Anion gap: 7 (ref 5–15)
BUN: 6 mg/dL — ABNORMAL LOW (ref 8–23)
CO2: 26 mmol/L (ref 22–32)
Calcium: 11 mg/dL — ABNORMAL HIGH (ref 8.9–10.3)
Chloride: 100 mmol/L (ref 98–111)
Creatinine, Ser: 0.81 mg/dL (ref 0.61–1.24)
GFR, Estimated: >60 mL/min (ref >60)
Glucose, Bld: 100 mg/dL — ABNORMAL HIGH (ref 70–99)
Potassium: 4.1 mmol/L (ref 3.5–5.1)
Sodium: 133 mmol/L — ABNORMAL LOW (ref 135–145)
Total Bilirubin: 0.5 mg/dL (ref 0.3–1.2)
Total Protein: 5.6 g/dL — ABNORMAL LOW (ref 6.5–8.1)

## 2021-12-09 LAB — CBC
HCT: 26.4 % — ABNORMAL LOW (ref 39.0–52.0)
Hemoglobin: 8.9 g/dL — ABNORMAL LOW (ref 13.0–17.0)
MCH: 29.5 pg (ref 26.0–34.0)
MCHC: 33.7 g/dL (ref 30.0–36.0)
MCV: 87.4 fL (ref 80.0–100.0)
Platelets: 177 10*3/uL (ref 150–400)
RBC: 3.02 MIL/uL — ABNORMAL LOW (ref 4.22–5.81)
RDW: 13 % (ref 11.5–15.5)
WBC: 8.4 10*3/uL (ref 4.0–10.5)
nRBC: 0 % (ref 0.0–0.2)

## 2021-12-09 LAB — MAGNESIUM: Magnesium: 1.4 mg/dL — ABNORMAL LOW (ref 1.7–2.4)

## 2021-12-09 LAB — HEMOGLOBIN AND HEMATOCRIT, BLOOD
HCT: 27.5 % — ABNORMAL LOW (ref 39.0–52.0)
Hemoglobin: 9.2 g/dL — ABNORMAL LOW (ref 13.0–17.0)

## 2021-12-09 MED ORDER — LIP MEDEX EX OINT
TOPICAL_OINTMENT | CUTANEOUS | Status: DC | PRN
  Administered 2021-12-09: 75 via TOPICAL
  Filled 2021-12-09: qty 7

## 2021-12-09 NOTE — Progress Notes (Signed)
Mobility Specialist - Progress Note   12/09/21 1500  Mobility  Activity Ambulated with assistance in hallway  Level of Assistance Standby assist, set-up cues, supervision of patient - no hands on  Assistive Device Front wheel walker  Distance Ambulated (ft) 400 ft  Range of Motion/Exercises Active  Activity Response Tolerated well  $Mobility charge 1 Mobility   Pt was found in bed and agreeable to ambulate. Had no complaints and at EOS returned to bed with all necessities in reach.  Ferd Hibbs Mobility Specialist

## 2021-12-09 NOTE — Progress Notes (Signed)
PROGRESS NOTE  Willie Parker  DOB: 1955/09/09  PCP: Egbert Garibaldi, PA-C ZLD:[id number]  DOA: 12/05/2021  LOS: 4 days  Hospital Day: 5  Brief narrative: Willie Parker is a 66 y.o. male with PMH significant for chronic alcoholism, smoking, cocaine use, osteoarthritis, peptic ulcer disease. 10/7, patient presented to the ED with complaint of abdominal pain, nausea, vomiting, fatigue, constipation for 2 weeks, and an episode of hematemesis on the day of presentation.  He also endorsed 40 pound weight loss in 1 month Initial work-up in the ED showed hemoglobin at 12.1, sodium at 129, AST/ALT/alk phos elevated CT abdomen/pelvis showed numerous liver lesions as well as numerous lytic bone lesions of the spine as well as the pelvis consistent with metastatic disease.   Admitted to [facility] GI consulted 10/10, patient underwent EGD/colonoscopy See below for details  Subjective: Patient was seen and examined this morning.  Elderly African-American male.  Lying in bed.  Not in distress.  Sister at bedside. Chart reviewed In the last 24 hours, remains afebrile, blood pressure in low normal range, in 90s this morning, breathing on room air. Last set of labs from this morning showed hemoglobin at 8.9, sodium 133, BUN/creatinine 6/0.81, magnesium 1.4, LFTs improving.  Assessment and plan: Acute GI bleeding Presented with coffee-ground emesis, 1 episode hematemesis, 40 pound unintentional weight loss in a month CT scan finding with possible intra-abdominal and bony metastasis, unclear primary 10/10, underwent EGD and colonoscopy EGD showed gastritis, nonbleeding gastric ulcer. Colonoscopy showed 2 polyps in the descending colon, transverse colon, diverticulosis in the left colon. Biopsies pending. Continue Protonix.  Keep aspirin on hold.  Acute blood loss anemia 2/2 GI bleeding Hemoglobin trend as below.  No active gross bleeding seizures pleasant.  Continue to monitor.  Transfuse if less than  7.  Recent Labs    12/06/21 0431 12/06/21 0800 12/07/21 1352 12/07/21 1730 12/08/21 0019 12/08/21 0616 12/09/21 0352  HGB 9.9*   < > 9.9* 8.2* 10.2* 10.5* 8.9*  MCV 86.1  --   --   --   --   --  87.4   < > = values in this interval not displayed.   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   Patient was planned for liver biopsy by IR today but canceled.  IR states that we should wait for biopsy report before proceeding with liver biopsy.  Discussed with patient and family.  In order to expedite the treatment, and to prevent loss of follow-up, they want to do the biopsy in the hospital.  I will keep him n.p.o. after midnight just in case colon biopsy report is tomorrow.  If not, hope it comes out before the weekend.   LFT trend as below. Recent Labs  Lab 12/05/21 1236 12/06/21 0431 12/07/21 0035 12/09/21 0352  AST 189* 173* 210* 163*  ALT 114* 102* 111* 93*  ALKPHOS 175* 151* 162* 134*  BILITOT 0.5 0.6 0.7 0.5  PROT 7.4 6.2* 6.5 5.6*  ALBUMIN 3.7 3.0* 3.2* 2.7*   Hypercalcemia Likely secondary to bony metastasis.  Remains elevated Continue IV hydration Recent Labs    12/05/21 1236 12/06/21 0431 12/07/21 0035 12/09/21 0352  CALCIUM 10.6* 10.0 10.6* 11.0*  PHOS  --  4.1  --   --    Mild hyponatremia Remains  stable between 130 and 135 Recent Labs  Lab 12/05/21 1236 12/06/21 0431 12/07/21 0035 12/09/21 0352  NA 129* 130* 131* 133*   Chronic alcoholism States he is not drinking anymore.  Questionable reliability Continue CIWA protocol.  Continue thiamine, folic acid and multivitamin.  Polysubstance use disorder UDS positive for opiates and cocaine.  EtOH level was less than 10.  Goals of care   Code Status: Full Code    Mobility: Encourage  ambulation  Skin assessment:     Nutritional status:  Body mass index is 19.08 kg/m.  Nutrition Problem: Moderate Malnutrition Etiology: chronic illness (substance abuse) Signs/Symptoms: energy intake < or equal to 75% for > or equal to 1 month, moderate fat depletion, moderate muscle depletion     Diet:  Diet Order             Diet NPO time specified  Diet effective midnight           Diet regular Room service appropriate? Yes; Fluid consistency: Thin  Diet effective now                   DVT prophylaxis:  SCDs Start: 12/05/21 1630   Antimicrobials: None Fluid: NS at 100 mill per hour Consultants: IR, GI Family Communication: Sister at bedside  Status is: Inpatient  Continue in-hospital care because: Pending, biopsy report, pending liver biopsy Level of care: Progressive   Dispo: The patient is from: Home              Anticipated d/c is to: Home after liver biopsy              Patient currently is not medically stable to d/c.   Difficult to place patient No     Infusions:   sodium chloride 100 mL/hr at 12/09/21 0253    Scheduled Meds:  feeding supplement  237 mL Oral BID BM   folic acid  1 mg Oral Daily   LORazepam  0-4 mg Intravenous Q12H   multivitamin with minerals  1 tablet Oral Daily   pantoprazole  40 mg Intravenous Q12H   pantoprazole (PROTONIX) IV  40 mg Intravenous Once   thiamine  100 mg Oral Daily   Or   thiamine  100 mg Intravenous Daily    PRN meds: acetaminophen **OR** acetaminophen, lip balm, morphine injection, ondansetron **OR** ondansetron (ZOFRAN) IV   Antimicrobials: Anti-infectives (From admission, onward)    None       Objective: Vitals:   12/09/21 0442 12/09/21 1145  BP: (!) 95/58 96/68  Pulse: 73 69  Resp: 17 16  Temp: 98.6 F (37 C) 98.4 F (36.9 C)  SpO2: 99% 100%    Intake/Output Summary (Last 24 hours) at 12/09/2021 1435 Last data filed at 12/09/2021 1145 Gross per 24 hour  Intake 311.67 ml   Output 1925 ml  Net -1613.33 ml   Filed Weights   12/05/21 1215 12/06/21 0631  Weight: 62.6 kg 63.8 kg   Weight change:  Body mass index is 19.08 kg/m.   Physical Exam: General exam: Pleasant, elderly African-American male.  Not in physical distress Skin: No rashes, lesions or ulcers. HEENT: Atraumatic, normocephalic, no obvious bleeding Lungs: Clear to auscultation bilaterally CVS: Regular rate and rhythm, no murmur GI/Abd soft, nontender, nondistended, bowel sound present CNS: Alert, awake, oriented x3 Psychiatry: Mood appropriate Extremities: No pedal edema, no calf tenderness  Data Review: I have personally reviewed the laboratory data and studies available.  F/u labs ordered [employer] (  From admission, onward)     Start     Ordered   12/10/21 0500  CBC with Differential/Platelet  Tomorrow morning,   R        12/09/21 1435   12/10/21 2536  Basic metabolic panel  Tomorrow morning,   R        12/09/21 1435            Signed, Terrilee Croak, MD [facility] 12/09/2021

## 2021-12-09 NOTE — Plan of Care (Signed)
  Problem: Clinical Measurements: Goal: Diagnostic test results will improve Outcome: Progressing Goal: Respiratory complications will improve Outcome: Progressing   Problem: Coping: Goal: Level of anxiety will decrease Outcome: Progressing   

## 2021-12-09 NOTE — Progress Notes (Signed)
[facility] Progress Note  SUBJECTIVE:   Interval history: Willie Parker was seen and evaluated today at bedside.  His wife and daughter were at bedside as well.  He is doing well.  He has some epigastric abdominal discomfort worse with eating.  He has some shortness of breath.  No chest pain.  No nausea or vomiting.  Had a nonbloody bowel movement this AM.  His appetite is good.  Pathology from his EGD and colonoscopy are pending.  Interventional radiology is following for potential liver biopsy.  Past Medical History:  Diagnosis Date   Arthritis    Knee pain    Peptic ulcer    Past Surgical History:  Procedure Laterality Date   MANDIBLE FRACTURE SURGERY     when he was 66 year old   TOTAL KNEE ARTHROPLASTY Right 12/19/2017   Procedure: RIGHT TOTAL KNEE ARTHROPLASTY;  Surgeon: Leandrew Koyanagi, MD;  Location: [facility];  Service: Orthopedics;  Laterality: Right;   Current Facility-Administered Medications  Medication Dose Route Frequency Provider Last Rate Last Admin   0.9 %  sodium chloride infusion   Intravenous Continuous Loney Laurence, DO 100 mL/hr at 12/09/21 0253 New Bag at 12/09/21 0253   acetaminophen (TYLENOL) tablet 650 mg  650 mg Oral Q6H PRN Loney Laurence, DO       Or   acetaminophen (TYLENOL) suppository 650 mg  650 mg Rectal Q6H PRN Loney Laurence, DO       feeding supplement (ENSURE ENLIVE / ENSURE PLUS) liquid 237 mL  237 mL Oral BID BM Pokhrel, Laxman, MD   237 mL at 43/15/40 0867   folic acid (FOLVITE) tablet 1 mg  1 mg Oral Daily Danton Clap H, DO   1 mg at 12/08/21 1602   lip balm (CARMEX) ointment   Topical PRN Terrilee Croak, MD   75 Application at 61/95/09 0923   LORazepam (ATIVAN) injection 0-4 mg  0-4 mg Intravenous Q12H Danton Clap H, DO       morphine (PF) 4 MG/ML injection 4 mg  4 mg Intravenous Q3H PRN Danton Clap H, DO   4 mg at 12/09/21 3267   multivitamin with minerals tablet 1 tablet  1 tablet Oral Daily Danton Clap H, DO   1 tablet at 12/08/21 1602   ondansetron (ZOFRAN) tablet 4 mg  4 mg Oral Q6H PRN Loney Laurence, DO       Or   ondansetron (ZOFRAN) injection 4 mg  4 mg Intravenous Q6H PRN Danton Clap H, DO       pantoprazole (PROTONIX) injection 40 mg  40 mg Intravenous Q12H Danton Clap H, DO   40 mg at 12/09/21 0924   pantoprazole (PROTONIX) injection 40 mg  40 mg Intravenous Once Danton Clap H, DO       thiamine (VITAMIN B1) tablet 100 mg  100 mg Oral Daily Danton Clap H, DO   100 mg at 12/08/21 1245   Or   thiamine (VITAMIN B1) injection 100 mg  100 mg Intravenous Daily Danton Clap H, DO   100 mg at 12/09/21 8099   Allergies as of 12/05/2021   (No Known Allergies)   Review of Systems:  Review of Systems  Respiratory:  Positive for shortness of breath.   Cardiovascular:  Negative for chest pain.  Gastrointestinal:  Positive for abdominal pain. Negative for blood in stool, constipation, diarrhea, nausea and vomiting.    OBJECTIVE:   Temp:  [97.2 F (36.2 C)-98.6 F (  37 C)] 98.4 F (36.9 C) (10/11 1145) Pulse Rate:  [69-87] 69 (10/11 1145) Resp:  [16-19] 16 (10/11 1145) BP: (84-108)/(33-68) 96/68 (10/11 1145) SpO2:  [96 %-100 %] 100 % (10/11 1145) Last BM Date : 12/08/21 Physical Exam Constitutional:      General: He is not in acute distress.    Appearance: He is underweight.  Cardiovascular:     Rate and Rhythm: Normal rate and regular rhythm.  Pulmonary:     Effort: No respiratory distress.     Breath sounds: Normal breath sounds.  Abdominal:     General: Bowel sounds are normal. There is no distension.     Palpations: Abdomen is soft.     Tenderness: There is abdominal tenderness. There is no guarding.  Musculoskeletal:     Right lower leg: No edema.     Left lower leg: No edema.  Skin:    General: Skin is warm and dry.  Neurological:     Mental Status: He is alert.     Labs: Recent Labs    12/08/21 0019 12/08/21 0616  12/09/21 0352  WBC  --   --  8.4  HGB 10.2* 10.5* 8.9*  HCT 30.6* 30.8* 26.4*  PLT  --   --  177   BMET Recent Labs    12/07/21 0035 12/09/21 0352  NA 131* 133*  K 4.2 4.1  CL 99 100  CO2 25 26  GLUCOSE 100* 100*  BUN 11 6*  CREATININE 0.87 0.81  CALCIUM 10.6* 11.0*   LFT Recent Labs    12/09/21 0352  PROT 5.6*  ALBUMIN 2.7*  AST 163*  ALT 93*  ALKPHOS 134*  BILITOT 0.5   PT/INR No results for input(s): "LABPROT", "INR" in the last 72 hours. Diagnostic imaging: No results found.  IMPRESSION: Melena Normocytic anemia Unintentional weight loss Large ulcer of gastric incisura with dark pigment on EGD 12/08/2021 status post gold probe cautery and biopsy Colon polyps x2 on colonoscopy 12/08/2021 Numerous, unspecified liver masses, suspect from metastatic disease  -IR following  PLAN: -Hemoglobin trend noted today, no nausea or vomiting and no signs of blood per rectum -No plan for repeat endoscopic evaluation at this time -Continue IV PPI every 12 hours -Trend H/H, transfuse for hemoglobin less than 7 -Follow-up pathology from EGD and colonoscopy on 12/08/2021 -GI soft diet as tolerated -Your interventional radiology recommendations -Gastroenterology will follow   LOS: 4 days   Danton Clap, [facility]

## 2021-12-10 ENCOUNTER — Inpatient Hospital Stay (HOSPITAL_COMMUNITY): Payer: Medicare Other

## 2021-12-10 ENCOUNTER — Telehealth: Payer: Self-pay | Admitting: Hematology and Oncology

## 2021-12-10 ENCOUNTER — Telehealth: Payer: Self-pay | Admitting: Hematology & Oncology

## 2021-12-10 DIAGNOSIS — F1010 Alcohol abuse, uncomplicated: Secondary | ICD-10-CM | POA: Diagnosis not present

## 2021-12-10 DIAGNOSIS — E440 Moderate protein-calorie malnutrition: Secondary | ICD-10-CM

## 2021-12-10 DIAGNOSIS — C169 Malignant neoplasm of stomach, unspecified: Secondary | ICD-10-CM | POA: Diagnosis present

## 2021-12-10 DIAGNOSIS — D62 Acute posthemorrhagic anemia: Secondary | ICD-10-CM | POA: Diagnosis not present

## 2021-12-10 DIAGNOSIS — K920 Hematemesis: Secondary | ICD-10-CM | POA: Diagnosis not present

## 2021-12-10 LAB — SURGICAL PATHOLOGY

## 2021-12-10 LAB — CBC WITH DIFFERENTIAL/PLATELET
Abs Immature Granulocytes: 0.14 10*3/uL — ABNORMAL HIGH (ref 0.00–0.07)
Basophils Absolute: 0 10*3/uL (ref 0.0–0.1)
Basophils Relative: 1 %
Eosinophils Absolute: 0.2 10*3/uL (ref 0.0–0.5)
Eosinophils Relative: 2 %
HCT: 27.3 % — ABNORMAL LOW (ref 39.0–52.0)
Hemoglobin: 9.2 g/dL — ABNORMAL LOW (ref 13.0–17.0)
Immature Granulocytes: 2 %
Lymphocytes Relative: 26 %
Lymphs Abs: 2.2 10*3/uL (ref 0.7–4.0)
MCH: 29.6 pg (ref 26.0–34.0)
MCHC: 33.7 g/dL (ref 30.0–36.0)
MCV: 87.8 fL (ref 80.0–100.0)
Monocytes Absolute: 0.7 10*3/uL (ref 0.1–1.0)
Monocytes Relative: 9 %
Neutro Abs: 5.2 10*3/uL (ref 1.7–7.7)
Neutrophils Relative %: 60 %
Platelets: 195 10*3/uL (ref 150–400)
RBC: 3.11 MIL/uL — ABNORMAL LOW (ref 4.22–5.81)
RDW: 13.2 % (ref 11.5–15.5)
WBC: 8.5 10*3/uL (ref 4.0–10.5)
nRBC: 0 % (ref 0.0–0.2)

## 2021-12-10 LAB — BASIC METABOLIC PANEL
Anion gap: 6 (ref 5–15)
BUN: 6 mg/dL — ABNORMAL LOW (ref 8–23)
CO2: 26 mmol/L (ref 22–32)
Calcium: 11.3 mg/dL — ABNORMAL HIGH (ref 8.9–10.3)
Chloride: 101 mmol/L (ref 98–111)
Creatinine, Ser: 0.91 mg/dL (ref 0.61–1.24)
GFR, Estimated: >60 mL/min (ref >60)
Glucose, Bld: 97 mg/dL (ref 70–99)
Potassium: 4.2 mmol/L (ref 3.5–5.1)
Sodium: 133 mmol/L — ABNORMAL LOW (ref 135–145)

## 2021-12-10 MED ORDER — DEXAMETHASONE 4 MG PO TABS
4.0000 mg | ORAL_TABLET | Freq: Every day | ORAL | Status: DC
  Administered 2021-12-10 – 2021-12-12 (×3): 4 mg via ORAL
  Filled 2021-12-10 (×3): qty 1

## 2021-12-10 MED ORDER — SUCRALFATE 1 GM/10ML PO SUSP
1.0000 g | Freq: Three times a day (TID) | ORAL | Status: DC
  Administered 2021-12-10 – 2021-12-12 (×8): 1 g via ORAL
  Filled 2021-12-10 (×8): qty 10

## 2021-12-10 MED ORDER — FUROSEMIDE 20 MG PO TABS
20.0000 mg | ORAL_TABLET | Freq: Every day | ORAL | Status: DC
  Administered 2021-12-10: 20 mg via ORAL
  Filled 2021-12-10: qty 1

## 2021-12-10 MED ORDER — PANTOPRAZOLE SODIUM 40 MG PO TBEC
40.0000 mg | DELAYED_RELEASE_TABLET | Freq: Two times a day (BID) | ORAL | Status: DC
  Administered 2021-12-10 – 2021-12-12 (×5): 40 mg via ORAL
  Filled 2021-12-10 (×5): qty 1

## 2021-12-10 NOTE — Telephone Encounter (Signed)
Spoke to pt and his daughter about appt with Dr. Marin Olp on 10/18. They are aware of appt location and date as well as to arrive early for check in. Willie Parker will schedule pt for appt.

## 2021-12-10 NOTE — TOC Progression Note (Signed)
Transition of Care [facility]) - Progression Note    Patient Details  Name: Willie Parker MRN: [id number] Date of Birth: 1955/10/03  Transition of Care [facility]) CM/SW [place], RN Phone Number: 12/10/2021, 10:25 AM  Clinical Narrative:   SA resources have been attached to AVS, no current TOC needs at this time.  TOC will continue to follow for any discharge needs.    Expected Discharge Plan: Home/Self Care Barriers to Discharge: Continued Medical Work up  Expected Discharge Plan and Services Expected Discharge Plan: Home/Self Care In-house Referral: NA Discharge Planning Services: CM Consult Post Acute Care Choice: NA Living arrangements for the past 2 months: Single Family Home                 DME Arranged: N/A DME Agency: NA                   Social Determinants of Health (SDOH) Interventions    Readmission Risk Interventions    12/06/2021   12:05 PM  Readmission Risk Prevention Plan  Transportation Screening Complete  PCP or Specialist Appt within 5-7 Days Complete  Home Care Screening Complete  Medication Review (RN CM) Complete

## 2021-12-10 NOTE — Care Management Important Message (Signed)
Important Message  Patient Details IM Letter given to the Patient. Name: Willie Parker MRN: [id number] Date of Birth: 1955-04-19   Medicare Important Message Given:  Yes     Kerin Salen 12/10/2021, 10:47 AM

## 2021-12-10 NOTE — Progress Notes (Signed)
[facility] Progress Note  Willie Parker 66 y.o. 1955-09-15  Subjective: Patient seen and examined at bedside this morning.  He is n.p.o., expects to have liver biopsy today.  He reports diffuse abdominal pain, worse in the upper abdomen.  Has had some nausea, no vomiting today.  Does report some chest pain and shortness of breath with exertion.  EGD path pending. Passing flatus but has not had a bowel movement.   ROS : Review of Systems  Constitutional:  Negative for chills and fever.  Respiratory:  Positive for shortness of breath.   Cardiovascular:  Positive for chest pain.  Gastrointestinal:  Positive for abdominal pain and nausea. Negative for heartburn and vomiting.  Musculoskeletal:  Negative for falls.      Objective: Vital signs in last 24 hours: Vitals:   12/10/21 0800 12/10/21 0918  BP:  110/64  Pulse:  60  Resp: 17 17  Temp:  98.4 F (36.9 C)  SpO2:  100%    Physical Exam:  General:  Alert, cooperative, no distress, appears stated age  Head:  Normocephalic, without obvious abnormality, atraumatic  Eyes:  Anicteric sclera, EOM's intact  Lungs:   Clear to auscultation bilaterally, respirations unlabored  Heart:  Regular rate and rhythm, S1, S2 normal  Abdomen:   Soft, diffusely tender to palpation, worse in epigastrium, bowel sounds active all four quadrants,  no masses  Extremities: Extremities normal, atraumatic, no  edema  Pulses: 2+ and symmetric    Lab Results: Recent Labs    12/09/21 0352 12/10/21 0419  NA 133* 133*  K 4.1 4.2  CL 100 101  CO2 26 26  GLUCOSE 100* 97  BUN 6* 6*  CREATININE 0.81 0.91  CALCIUM 11.0* 11.3*  MG 1.4*  --    Recent Labs    12/09/21 0352  AST 163*  ALT 93*  ALKPHOS 134*  BILITOT 0.5  PROT 5.6*  ALBUMIN 2.7*   Recent Labs    12/09/21 0352 12/09/21 1719 12/10/21 0419  WBC 8.4  --  8.5  NEUTROABS  --   --  5.2  HGB 8.9* 9.2* 9.2*  HCT 26.4* 27.5* 27.3*  MCV 87.4  --  87.8  PLT 177  --  195    No results for input(s): "LABPROT", "INR" in the last 72 hours.  Assessment Melena, normocytic anemia - Large gastric ulcer seen on EGD 12/08/2021, status post gold probe cautery and biopsy, results pending - Colon polyps x2 seen on colonoscopy 12/08/2021, path pending - Hemoglobin 9.2 today, stable, no signs of active bleeding  Numerous, unspecified for masses, suspect for metastatic disease - IR planning biopsy, possibly today  Plan: Will await pathology results.  Continue IV PPI every 12 hours Continue daily CBC and transfuse as needed to maintain HGB > 7  N.p.o. at this time for liver biopsy.  Consider addition of carafate after procedure for abdominal pain.  [facility] will follow.  Angelique Holm PA-C 12/10/2021, 11:19 AM  Contact #  [phone]

## 2021-12-10 NOTE — Progress Notes (Signed)
Mobility Specialist - Progress Note   12/10/21 1430  Mobility  Activity Ambulated with assistance in hallway  Level of Assistance Independent after set-up  Assistive Device Front wheel walker  Distance Ambulated (ft) 700 ft  Range of Motion/Exercises Active  Activity Response Tolerated well  Mobility Referral Yes  $Mobility charge 1 Mobility   Pt received in bed and agreeable to mobility. Pt took one seated rest break during ambulation. No complaints during mobility.  Pt to bed after session with all needs met.    [facility]

## 2021-12-10 NOTE — Progress Notes (Signed)
PROGRESS NOTE  Willie Parker  DOB: November 03, 1955  PCP: Egbert Garibaldi, PA-C DXI:[id number]  DOA: 12/05/2021  LOS: 5 days  Hospital Day: 6  Brief narrative: Willie Parker is a 66 y.o. male with PMH significant for chronic alcoholism, smoking, cocaine use, osteoarthritis, peptic ulcer disease. 10/7, patient presented to the ED with complaint of abdominal pain, nausea, vomiting, fatigue, constipation for 2 weeks, and an episode of hematemesis on the day of presentation.  He also endorsed 40 pound weight loss in 1 month Initial work-up in the ED showed hemoglobin at 12.1, sodium at 129, AST/ALT/alk phos elevated CT abdomen/pelvis showed numerous liver lesions as well as numerous lytic bone lesions of the spine as well as the pelvis consistent with metastatic disease.   Admitted to [facility] GI consulted 10/10, patient underwent EGD/colonoscopy Gastric ulcer biopsy positive for adenocarcinoma.  Subjective: Patient was seen and examined.  He complained of mild epigastric pain.  He was n.p.o. in the morning.  I went to talk to him again with his daughter at the bedside.  Patient denied any complaints.  He was able to eat his lunch.  Denies any nausea vomiting. Updated patient and daughter that gastric ulcer biopsy showing cancer cells, a scheduled appointment with oncology next week in [place] to discuss treatment plan.  Patient is planning to go to daughter's home to avoid going into his alcoholic environment.  Assessment and plan: Acute GI bleeding Presented with coffee-ground emesis, 1 episode hematemesis, 40 pound unintentional weight loss in a month CT scan finding with possible intra-abdominal and bony metastasis, probably gastric ulcer. 10/10, underwent EGD and colonoscopy EGD showed gastritis, nonbleeding gastric ulcer. Colonoscopy showed 2 polyps in the descending colon, transverse colon, diverticulosis in the left colon. 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 Currently on IV Protonix.  Changed to oral Protonix, add sucralfate.  Advance to regular diet.  GI following.  Acute blood loss anemia 2/2 GI bleeding Hemoglobin trend as below.  No active gross bleeding seizures pleasant.  Continue to monitor.  Transfuse if less than 7.  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 Outpatient oncology follow-up as above.  Hypercalcemia Likely secondary to bony metastasis.  Remains elevated.  On IV fluids.  Will discontinue and monitor levels tomorrow morning.  If persistently elevated, will discuss with oncology about giving a dose of Zometa before discharge. Recent Labs    12/05/21 1236 12/06/21 0431 12/07/21 0035 12/09/21 0352 12/10/21 0419  CALCIUM 10.6* 10.0 10.6* 11.0* 11.3*  PHOS  --  4.1  --   --   --    Mild hyponatremia Remains stable between 130 and 135 Recent Labs  Lab 12/05/21 1236 12/06/21 0431 12/07/21 0035 12/09/21 0352 12/10/21 0419  NA 129* 130* 131* 133* 133*   Chronic alcoholism States he is not drinking anymore.  No evidence of withdrawal complications so far. Continue CIWA protocol.  Continue thiamine, folic acid and multivitamin.  Polysubstance use disorder UDS positive for opiates and cocaine.  EtOH level was less than 10.  Goals of care   Code Status: Full Code    Mobility: Encourage ambulation  Skin assessment:     Nutritional status:  Body mass index is 19.08 kg/m.  Nutrition Problem: Moderate Malnutrition Etiology: chronic illness (substance abuse) Signs/Symptoms:  energy intake < or equal to 75% for > or equal to 1 month, moderate fat depletion, moderate muscle depletion     Diet:  Diet Order             Diet regular Room service appropriate? Yes; Fluid consistency: Thin  Diet effective now                    DVT prophylaxis:  SCDs Start: 12/05/21 1630   Antimicrobials: None Fluid: Stop IV fluids. Consultants: IR, GI Family Communication: Sister on the phone.  Daughter at the bedside.  Status is: Inpatient  Continue in-hospital care because: Taper off IV  medications, monitor tolerance to diet and anticipate home tomorrow. Level of care: Progressive   Dispo: The patient is from: Home              Anticipated d/c is to: Home likely tomorrow.              Patient currently is not medically stable to d/c.   Difficult to place patient No     Infusions:     Scheduled Meds:  feeding supplement  237 mL Oral BID BM   folic acid  1 mg Oral Daily   multivitamin with minerals  1 tablet Oral Daily   pantoprazole  40 mg Oral BID   sucralfate  1 g Oral TID WC & HS   thiamine  100 mg Oral Daily   Or   thiamine  100 mg Intravenous Daily    PRN meds: acetaminophen **OR** acetaminophen, lip balm, ondansetron **OR** ondansetron (ZOFRAN) IV   Antimicrobials: Anti-infectives (From admission, onward)    None       Objective: Vitals:   12/10/21 0918 12/10/21 1309  BP: 110/64 (!) 102/56  Pulse: 60 82  Resp: 17 18  Temp: 98.4 F (36.9 C) 98 F (36.7 C)  SpO2: 100% 100%    Intake/Output Summary (Last 24 hours) at 12/10/2021 1454 Last data filed at 12/10/2021 0907 Gross per 24 hour  Intake 2871.3 ml  Output 1725 ml  Net 1146.3 ml   Filed Weights   12/05/21 1215 12/06/21 0631  Weight: 62.6 kg 63.8 kg   Weight change:  Body mass index is 19.08 kg/m.   Physical Exam: General exam: Pleasant, Not in physical distress.  Defers conversations to daughter. Skin: No rashes, lesions or ulcers. HEENT: Atraumatic, normocephalic, no obvious bleeding Lungs: Clear to auscultation bilaterally CVS: Regular rate and rhythm, no murmur GI/Abd soft, nontender, nondistended, bowel sound present CNS: Alert, awake, oriented x3 Psychiatry: Flat affect. Extremities: No  pedal edema, no calf tenderness  Data Review: I have personally reviewed the laboratory data and studies available.  F/u labs ordered Unresulted Labs (From admission, onward)    None       Signed, Barb Merino, MD [facility] 12/10/2021

## 2021-12-10 NOTE — Telephone Encounter (Signed)
Received referral from hospital The patient underwent ultrasound-guided biopsy, result pending We will get him scheduled to be seen as outpatient

## 2021-12-11 ENCOUNTER — Encounter (HOSPITAL_COMMUNITY): Payer: Self-pay | Admitting: Internal Medicine

## 2021-12-11 DIAGNOSIS — C169 Malignant neoplasm of stomach, unspecified: Secondary | ICD-10-CM

## 2021-12-11 LAB — COMPREHENSIVE METABOLIC PANEL
ALT: 137 U/L — ABNORMAL HIGH (ref 0–44)
AST: 230 U/L — ABNORMAL HIGH (ref 15–41)
Albumin: 2.7 g/dL — ABNORMAL LOW (ref 3.5–5.0)
Alkaline Phosphatase: 233 U/L — ABNORMAL HIGH (ref 38–126)
Anion gap: 11 (ref 5–15)
BUN: 8 mg/dL (ref 8–23)
CO2: 24 mmol/L (ref 22–32)
Calcium: 12 mg/dL — ABNORMAL HIGH (ref 8.9–10.3)
Chloride: 100 mmol/L (ref 98–111)
Creatinine, Ser: 0.79 mg/dL (ref 0.61–1.24)
GFR, Estimated: >60 mL/min (ref >60)
Glucose, Bld: 118 mg/dL — ABNORMAL HIGH (ref 70–99)
Potassium: 4.1 mmol/L (ref 3.5–5.1)
Sodium: 135 mmol/L (ref 135–145)
Total Bilirubin: 0.4 mg/dL (ref 0.3–1.2)
Total Protein: 6.1 g/dL — ABNORMAL LOW (ref 6.5–8.1)

## 2021-12-11 LAB — CBC WITH DIFFERENTIAL/PLATELET
Abs Immature Granulocytes: 0.16 10*3/uL — ABNORMAL HIGH (ref 0.00–0.07)
Basophils Absolute: 0 10*3/uL (ref 0.0–0.1)
Basophils Relative: 0 %
Eosinophils Absolute: 0 10*3/uL (ref 0.0–0.5)
Eosinophils Relative: 0 %
HCT: 28.6 % — ABNORMAL LOW (ref 39.0–52.0)
Hemoglobin: 9.8 g/dL — ABNORMAL LOW (ref 13.0–17.0)
Immature Granulocytes: 2 %
Lymphocytes Relative: 17 %
Lymphs Abs: 1.6 10*3/uL (ref 0.7–4.0)
MCH: 29.7 pg (ref 26.0–34.0)
MCHC: 34.3 g/dL (ref 30.0–36.0)
MCV: 86.7 fL (ref 80.0–100.0)
Monocytes Absolute: 0.7 10*3/uL (ref 0.1–1.0)
Monocytes Relative: 7 %
Neutro Abs: 6.9 10*3/uL (ref 1.7–7.7)
Neutrophils Relative %: 74 %
Platelets: 200 10*3/uL (ref 150–400)
RBC: 3.3 MIL/uL — ABNORMAL LOW (ref 4.22–5.81)
RDW: 13.2 % (ref 11.5–15.5)
WBC: 9.3 10*3/uL (ref 4.0–10.5)
nRBC: 0 % (ref 0.0–0.2)

## 2021-12-11 LAB — MAGNESIUM: Magnesium: 1.3 mg/dL — ABNORMAL LOW (ref 1.7–2.4)

## 2021-12-11 LAB — PHOSPHORUS: Phosphorus: 3.9 mg/dL (ref 2.5–4.6)

## 2021-12-11 MED ORDER — TRAMADOL HCL 50 MG PO TABS
50.0000 mg | ORAL_TABLET | Freq: Four times a day (QID) | ORAL | Status: DC | PRN
  Administered 2021-12-11 – 2021-12-12 (×3): 50 mg via ORAL
  Filled 2021-12-11 (×3): qty 1

## 2021-12-11 MED ORDER — MAGNESIUM SULFATE 2 GM/50ML IV SOLN
2.0000 g | Freq: Once | INTRAVENOUS | Status: AC
  Administered 2021-12-11: 2 g via INTRAVENOUS
  Filled 2021-12-11: qty 50

## 2021-12-11 MED ORDER — OXYCODONE HCL 5 MG PO TABS: 5.0000 mg | ORAL_TABLET | ORAL | Status: DC | PRN

## 2021-12-11 MED ORDER — ORAL CARE MOUTH RINSE: 15.0000 mL | OROMUCOSAL | Status: DC | PRN

## 2021-12-11 MED ORDER — SODIUM CHLORIDE 0.9 % IV SOLN: INTRAVENOUS | Status: DC

## 2021-12-11 MED ORDER — FUROSEMIDE 10 MG/ML IJ SOLN
40.0000 mg | Freq: Every day | INTRAMUSCULAR | Status: DC
  Administered 2021-12-11 – 2021-12-12 (×2): 40 mg via INTRAVENOUS
  Filled 2021-12-11 (×2): qty 4

## 2021-12-11 NOTE — Progress Notes (Signed)
Mobility Specialist - Progress Note   12/11/21 1650  Mobility  Activity Ambulated with assistance in hallway  Level of Assistance Contact guard assist, steadying assist  Assistive Device Front wheel walker  Distance Ambulated (ft) 350 ft  Range of Motion/Exercises Active  Activity Response Tolerated well  Mobility Referral Yes  $Mobility charge 1 Mobility   Pt was found in bed and agreeable to ambulate. Before ambulation stated having abdominal pain on his left side. At EOS returned to bed with all necessities in reach.  Ferd Hibbs Mobility Specialist

## 2021-12-11 NOTE — Progress Notes (Signed)
Mobility Specialist - Progress Note   12/11/21 1520  Mobility  Activity Ambulated with assistance in hallway  Level of Assistance Contact guard assist, steadying assist  Assistive Device Front wheel walker  Distance Ambulated (ft) 350 ft  Range of Motion/Exercises Active  Activity Response Tolerated well  Mobility Referral Yes  $Mobility charge 1 Mobility   Pt was found in bed and agreeable to ambulate. Grew fatigued during session and returned to bed with all necessities in reach at EOS.  Ferd Hibbs Mobility Specialist

## 2021-12-11 NOTE — Progress Notes (Signed)
[facility] Progress Note  SUBJECTIVE:   Interval history: Willie Parker was seen and evaluated today at bedside.  He was resting comfortably in bed.  He notes having abdominal discomfort whenever he eats.  He has not had a bowel movement though is passing gas.  No chest pain.  Has shortness of breath.  Hemoglobin stable 9.8 today from 9.2 yesterday.  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  He verbalized understanding and asked appropriate questions about treatment options.  I recommended that he speak to the oncology team regarding treatment options, it appears that he has an appointment next week.  We will plan to optimize his pain control from gastric ulcer with PPI therapy and Carafate.  Past Medical History:  Diagnosis Date   Arthritis    Knee pain    Peptic ulcer    Past Surgical History:  Procedure Laterality Date   MANDIBLE FRACTURE SURGERY     when he was 66 year old   TOTAL KNEE ARTHROPLASTY Right 12/19/2017   Procedure: RIGHT TOTAL KNEE ARTHROPLASTY;  Surgeon: Leandrew Koyanagi, MD;  Location: [facility];  Service: Orthopedics;  Laterality: Right;   Current Facility-Administered Medications  Medication Dose Route Frequency Provider Last Rate Last Admin   0.9 %  sodium chloride infusion   Intravenous Continuous Barb Merino, MD 150 mL/hr at 12/11/21 0841 New Bag at 12/11/21 0841   acetaminophen (TYLENOL) tablet 650 mg  650 mg Oral Q6H PRN Loney Laurence, DO       Or   acetaminophen (TYLENOL) suppository 650 mg  650 mg Rectal Q6H PRN Danton Clap H, DO       dexamethasone (DECADRON) tablet 4 mg  4 mg Oral Daily Barb Merino, MD   4 mg at 12/11/21 0926   feeding supplement (ENSURE ENLIVE / ENSURE PLUS) liquid 237 mL  237 mL Oral BID BM Pokhrel, Laxman, MD   237 mL at 86/76/72 0947   folic acid (FOLVITE) tablet 1 mg  1 mg Oral Daily Danton Clap H, DO   1 mg at  12/11/21 0962   furosemide (LASIX) injection 40 mg  40 mg Intravenous Daily Barb Merino, MD   40 mg at 12/11/21 8366   lip balm (CARMEX) ointment   Topical PRN Terrilee Croak, MD   75 Application at 29/47/65 0923   multivitamin with minerals tablet 1 tablet  1 tablet Oral Daily Danton Clap H, DO   1 tablet at 12/11/21 0926   ondansetron (ZOFRAN) tablet 4 mg  4 mg Oral Q6H PRN Loney Laurence, DO       Or   ondansetron (ZOFRAN) injection 4 mg  4 mg Intravenous Q6H PRN Loney Laurence, DO       Oral care mouth rinse  15 mL Mouth Rinse PRN Barb Merino, MD       pantoprazole (PROTONIX) EC tablet 40 mg  40 mg Oral BID Barb Merino, MD   40 mg at 12/11/21 0926   sucralfate (CARAFATE) 1 GM/10ML suspension 1 g  1 g Oral TID WC & HS Barb Merino, MD   1 g at 12/11/21 1131   thiamine (VITAMIN B1) tablet 100 mg  100 mg Oral Daily Danton Clap H, DO   100 mg at 12/11/21 4650   Or   thiamine (VITAMIN B1) injection 100 mg  100 mg Intravenous Daily Danton Clap H, DO   100 mg at 12/10/21 3546  traMADol (ULTRAM) tablet 50 mg  50 mg Oral Q6H PRN Barb Merino, MD   50 mg at 12/11/21 0844   Allergies as of 12/05/2021   (No Known Allergies)   Review of Systems:  Review of Systems  Respiratory:  Positive for shortness of breath.   Cardiovascular:  Negative for chest pain.  Gastrointestinal:  Positive for abdominal pain. Negative for constipation, diarrhea, nausea and vomiting.    OBJECTIVE:   Temp:  [98 F (36.7 C)-98.8 F (37.1 C)] 98.6 F (37 C) (10/13 0422) Pulse Rate:  [70-82] 70 (10/13 0441) Resp:  [17-19] 17 (10/13 0422) BP: (94-107)/(56-74) 100/61 (10/13 0441) SpO2:  [99 %-100 %] 100 % (10/13 0422) Last BM Date : 12/09/21 Physical Exam Constitutional:      General: He is not in acute distress.    Appearance: He is underweight. He is not ill-appearing, toxic-appearing or diaphoretic.  Cardiovascular:     Rate and Rhythm: Normal rate and regular rhythm.   Pulmonary:     Effort: No respiratory distress.     Breath sounds: Normal breath sounds.  Abdominal:     General: Bowel sounds are normal. There is no distension.     Palpations: Abdomen is soft.     Tenderness: There is abdominal tenderness. There is no guarding.  Neurological:     Mental Status: He is alert.     Labs: Recent Labs    12/09/21 0352 12/09/21 1719 12/10/21 0419 12/11/21 0353  WBC 8.4  --  8.5 9.3  HGB 8.9* 9.2* 9.2* 9.8*  HCT 26.4* 27.5* 27.3* 28.6*  PLT 177  --  195 200   BMET Recent Labs    12/09/21 0352 12/10/21 0419 12/11/21 0353  NA 133* 133* 135  K 4.1 4.2 4.1  CL 100 101 100  CO2 '26 26 24  '$ GLUCOSE 100* 97 118*  BUN 6* 6* 8  CREATININE 0.81 0.91 0.79  CALCIUM 11.0* 11.3* 12.0*   LFT Recent Labs    12/11/21 0353  PROT 6.1*  ALBUMIN 2.7*  AST 230*  ALT 137*  ALKPHOS 233*  BILITOT 0.4   PT/INR No results for input(s): "LABPROT", "INR" in the last 72 hours. Diagnostic imaging: No results found.  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 pylori Colon polyps x2 on colonoscopy 12/08/2021  -Polypectomy pathology with tubular adenoma x2 Numerous, unspecified liver masses, suspect from metastatic disease  -Pending pathology report from IR biopsy 12/10/2021  PLAN: -Discussed malignancy findings with patient at bedside today, he was tearful and had appropriate questions -Recommended that he discuss treatment options further with oncology at his office visit next week -Continue IV PPI every 12 hours while inpatient, transition to oral PPI therapy twice daily on discharge -Continue Carafate suspension 1 g p.o. 3 times daily and nightly -Diet as tolerated -Gastroenterology team will  respectfully sign off and be available for any questions that arise   LOS: 6 days   Danton Clap, [facility]

## 2021-12-11 NOTE — Progress Notes (Signed)
PROGRESS NOTE  Willie Parker  DOB: [birth year]  PCP: Egbert Garibaldi, PA-C ONG:[id number]  DOA: 12/05/2021  LOS: 6 days  Hospital Day: 7  Brief narrative: Willie Parker is a 66 y.o. male with PMH significant for chronic alcoholism, smoking, cocaine use, osteoarthritis, peptic ulcer disease. 10/7, patient presented to the ED with complaint of abdominal pain, nausea, vomiting, fatigue, constipation for 2 weeks, and an episode of hematemesis on the day of presentation.  He also endorsed 40 pound weight loss in 1 month Initial work-up in the ED showed hemoglobin at 12.1, sodium at 129, AST/ALT/alk phos elevated CT abdomen/pelvis showed numerous liver lesions as well as numerous lytic bone lesions of the spine as well as the pelvis consistent with metastatic disease.   Admitted to [facility] GI consulted 10/10, patient underwent EGD/colonoscopy Gastric ulcer biopsy positive for adenocarcinoma.  Subjective: Patient seen and examined.  No overnight events.  Denies any nausea vomiting.  Appetite remains poor.  He feels weak and difficulty mobility. Magnesium is 1.3.  Calcium is 12 despite dexamethasone and Lasix. He wants to improve his mobility today and anticipate home tomorrow.  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 Pathology from gastric ulcer positive for adenocarcinoma. Newly diagnosed gastric ulcer with metastatic disease to liver and bones.  Medically stabilizing. EGD 10/10 with gastritis, nonbleeding gastric ulcer.  Colonoscopy with 2 polyps negative for malignancy. Oral Protonix, sucralfate, encourage oral intake. Continue mobilizing.  Improve mobility and oral intake before discharge.  Hypercalcemia of malignancy: Calcium remains persistently elevated.  Case discussed with oncology. Start patient on IV fluids 150 mL/h, dexamethasone 4 mg daily, Lasix 40 mg daily.  Recheck levels tomorrow morning.  If improving, will discharge on dexamethasone  and oral Lasix. Patient is scheduled to see oncology clinic next week for further treatment planning.  Chronic alcoholism States he is not drinking anymore.  No evidence of withdrawal complications so far. Continue CIWA protocol.  Continue thiamine, folic acid and multivitamin.  Polysubstance use disorder UDS positive for opiates and cocaine.  EtOH level was less than 10.  Hypomagnesemia Severe and persistent.  Replace aggressively IV today.  Recheck levels tomorrow.  Goals of care   Code Status: Full Code    Mobility: Encourage ambulation  Skin assessment:     Nutritional status:  Body mass index is 19.08 kg/m.  Nutrition Problem: Moderate Malnutrition Etiology: chronic illness (substance abuse) Signs/Symptoms: energy intake < or equal to 75% for > or equal to 1 month, moderate fat depletion, moderate muscle depletion     Diet:  Diet Order             Diet regular Room service appropriate? Yes; Fluid consistency: Thin  Diet effective now                   DVT prophylaxis:  SCDs Start: 12/05/21 1630   Antimicrobials: None Fluid: Normal saline 150 mL/h Consultants: IR, GI Family Communication: None today.  Status is: Inpatient  Continue in-hospital care because: Severe electrolyte abnormalities, elevated calcium, IV fluids. Level of care: Progressive   Dispo: The patient is from: Home              Anticipated d/c is to: Home likely tomorrow.              Patient currently is not medically stable to d/c.   Difficult to place patient No     Infusions:   sodium chloride 150 mL/hr at 12/11/21 0841  Scheduled Meds:  dexamethasone  4 mg Oral Daily   feeding supplement  237 mL Oral BID BM   folic acid  1 mg Oral Daily   furosemide  40 mg Intravenous Daily   multivitamin with minerals  1 tablet Oral Daily   pantoprazole  40 mg Oral BID   sucralfate  1 g Oral TID WC & HS   thiamine  100 mg Oral Daily   Or   thiamine  100 mg Intravenous Daily     PRN meds: acetaminophen **OR** acetaminophen, lip balm, ondansetron **OR** ondansetron (ZOFRAN) IV, mouth rinse, traMADol   Antimicrobials: Anti-infectives (From admission, onward)    None       Objective: Vitals:   12/11/21 0422 12/11/21 0441  BP: (!) 94/59 100/61  Pulse: 71 70  Resp: 17   Temp: 98.6 F (37 C)   SpO2: 100%     Intake/Output Summary (Last 24 hours) at 12/11/2021 1136 Last data filed at 12/11/2021 1000 Gross per 24 hour  Intake 631.24 ml  Output 3240 ml  Net -2608.76 ml   Filed Weights   12/05/21 1215 12/06/21 0631  Weight: 62.6 kg 63.8 kg   Weight change:  Body mass index is 19.08 kg/m.   Physical Exam:  General: Chronically sick looking but not in any distress.  Pleasant to conversation.  Thin and cachectic. Cardiovascular: S1-S2 normal.  Regular rate rhythm. Respiratory: Bilateral clear.  No added sounds. Gastrointestinal: Soft.  Nontender.  Nontender hepatomegaly palpable 4 cm below costal margins. Ext: No swelling or edema.  No cyanosis.  No deformities. Neuro: Alert and x4.  Flat affect.  No neurological deficits.    Data Review: I have personally reviewed the laboratory data and studies available.  F/u labs ordered Unresulted Labs (From admission, onward)     Start     Ordered   12/12/21 0500  Comprehensive metabolic panel  Tomorrow morning,   R       Question:  Specimen collection method  Answer:  Lab=Lab collect   12/11/21 0827   12/12/21 0500  Magnesium  Tomorrow morning,   R       Question:  Specimen collection method  Answer:  Lab=Lab collect   12/11/21 0827            Signed, Barb Merino, MD [facility] 12/11/2021

## 2021-12-11 NOTE — Progress Notes (Signed)
Mobility Specialist - Progress Note   12/11/21 1200  Mobility  Activity Stood at bedside  Level of Assistance Contact guard assist, steadying assist  Assistive Device None  Range of Motion/Exercises Active  Activity Response Tolerated well  $Mobility charge 1 Mobility   Pt was found in bed and agreeable to ambulate. Once stood up was very unsteady and did not feel like he was ready to ambulate at this time. Will try ambulation at a later time. At EOS returned to bed with all necessities in reach.  Ferd Hibbs Mobility Specialist

## 2021-12-12 DIAGNOSIS — C799 Secondary malignant neoplasm of unspecified site: Secondary | ICD-10-CM

## 2021-12-12 DIAGNOSIS — R1084 Generalized abdominal pain: Secondary | ICD-10-CM

## 2021-12-12 LAB — COMPREHENSIVE METABOLIC PANEL
ALT: 160 U/L — ABNORMAL HIGH (ref 0–44)
AST: 257 U/L — ABNORMAL HIGH (ref 15–41)
Albumin: 3 g/dL — ABNORMAL LOW (ref 3.5–5.0)
Alkaline Phosphatase: 249 U/L — ABNORMAL HIGH (ref 38–126)
Anion gap: 7 (ref 5–15)
BUN: 13 mg/dL (ref 8–23)
CO2: 25 mmol/L (ref 22–32)
Calcium: 12.5 mg/dL — ABNORMAL HIGH (ref 8.9–10.3)
Chloride: 101 mmol/L (ref 98–111)
Creatinine, Ser: 1.01 mg/dL (ref 0.61–1.24)
GFR, Estimated: >60 mL/min (ref >60)
Glucose, Bld: 87 mg/dL (ref 70–99)
Potassium: 3.8 mmol/L (ref 3.5–5.1)
Sodium: 133 mmol/L — ABNORMAL LOW (ref 135–145)
Total Bilirubin: 0.6 mg/dL (ref 0.3–1.2)
Total Protein: 6.1 g/dL — ABNORMAL LOW (ref 6.5–8.1)

## 2021-12-12 LAB — MAGNESIUM: Magnesium: 1.7 mg/dL (ref 1.7–2.4)

## 2021-12-12 MED ORDER — TRAMADOL HCL 50 MG PO TABS: 50.0000 mg | ORAL_TABLET | Freq: Four times a day (QID) | ORAL | 0 refills | Status: DC | PRN

## 2021-12-12 MED ORDER — ONDANSETRON HCL 4 MG PO TABS: 4.0000 mg | ORAL_TABLET | Freq: Four times a day (QID) | ORAL | 0 refills | Status: DC | PRN

## 2021-12-12 MED ORDER — ZOLEDRONIC ACID 4 MG/5ML IV CONC: 4.0000 mg | Freq: Once | INTRAVENOUS | Status: DC

## 2021-12-12 MED ORDER — SUCRALFATE 1 GM/10ML PO SUSP: 1.0000 g | Freq: Three times a day (TID) | ORAL | 0 refills | Status: DC

## 2021-12-12 MED ORDER — LORAZEPAM 2 MG/ML IJ SOLN: 0.5000 mg | Freq: Four times a day (QID) | INTRAMUSCULAR | Status: DC | PRN

## 2021-12-12 MED ORDER — MAGNESIUM SULFATE 2 GM/50ML IV SOLN
2.0000 g | Freq: Once | INTRAVENOUS | Status: AC
  Administered 2021-12-12: 2 g via INTRAVENOUS
  Filled 2021-12-12: qty 50

## 2021-12-12 MED ORDER — FUROSEMIDE 40 MG PO TABS: 40.0000 mg | ORAL_TABLET | Freq: Every day | ORAL | 0 refills | Status: DC

## 2021-12-12 MED ORDER — SODIUM CHLORIDE 0.9 % IV BOLUS
1000.0000 mL | Freq: Once | INTRAVENOUS | Status: AC
  Administered 2021-12-12: 1000 mL via INTRAVENOUS

## 2021-12-12 MED ORDER — LORAZEPAM 0.5 MG PO TABS: 0.5000 mg | ORAL_TABLET | Freq: Two times a day (BID) | ORAL | 0 refills | Status: DC

## 2021-12-12 MED ORDER — DEXAMETHASONE 4 MG PO TABS: 4.0000 mg | ORAL_TABLET | Freq: Every day | ORAL | 0 refills | Status: DC

## 2021-12-12 MED ORDER — PANTOPRAZOLE SODIUM 40 MG PO TBEC: 40.0000 mg | DELAYED_RELEASE_TABLET | Freq: Two times a day (BID) | ORAL | 0 refills | Status: DC

## 2021-12-12 NOTE — Progress Notes (Signed)
Patient discharged home. Discharge instructions explained to both patient and daughter, both verbalize understanding.  Patient discharged with walker.

## 2021-12-12 NOTE — Discharge Summary (Signed)
Physician Discharge Summary  Willie Parker NWG:[id number] DOB: February 11, 1956 DOA: 12/05/2021  PCP: Egbert Garibaldi, PA-C  Admit date: 12/05/2021 Discharge date: 12/12/2021  Admitted From: Home Disposition: Home with family  Recommendations for Outpatient Follow-up:  Follow-up with oncology next week as scheduled. Referral sent to outpatient palliative care for follow-up at home.  Home Health: N/A Equipment/Devices: Walker  Discharge Condition: Fair CODE STATUS: Full code Diet recommendation: Regular diet as tolerated  Discharge summary: 66 y.o. male with PMH significant for chronic alcoholism, smoking, cocaine use, osteoarthritis, peptic ulcer disease. 10/7, patient presented to the ED with complaint of abdominal pain, nausea, vomiting, fatigue, constipation for 2 weeks, and an episode of hematemesis on the day of presentation.  He also endorsed 40 pound weight loss in 1 month Initial work-up in the ED showed hemoglobin at 12.1, sodium at 129, AST/ALT/alk phos elevated CT abdomen/pelvis showed numerous liver lesions as well as numerous lytic bone lesions of the spine as well as the pelvis consistent with metastatic disease.   Admitted to [facility] GI consulted 10/10, patient underwent EGD/colonoscopy Gastric ulcer biopsy positive for adenocarcinoma.  Acute GI bleeding secondary to malignant gastric ulcer. Pathology from gastric ulcer positive for adenocarcinoma. Newly diagnosed gastric ulcer with metastatic disease to liver and bones. Hypercalcemia of malignancy.   Medically stabilizing. EGD 10/10 with gastritis, nonbleeding gastric ulcer.  Colonoscopy with 2 polyps negative for malignancy. Oral Protonix, sucralfate, encourage oral intake. Oral intake remains poor but he wants to go home and eat. Calcium levels more than 12.  Corrected calcium 13.  Given IV fluids, started on Lasix and dexamethasone as suggested by oncology.  Today, he is willing to go home.  His calcium is moderately  elevated but without complications.  We need to recheck at follow-up visit.  See goal of care discussion below.  Chronic alcoholism States he is not drinking anymore.  No evidence of withdrawal complications so far. Multivitamins.   Polysubstance use disorder UDS positive for opiates and cocaine.  EtOH level was less than 10.  Expressed desire to quit.   Hypomagnesemia Severe and persistent.  Aggressively replaced before discharge.  Goal of care: New diagnosis of gastric cancer with liver mets and bone mets with hypercalcemia of malignancy, failure to thrive and moderate protein calorie malnutrition.  Patient is high risk of decompensation.  Patient has some clinical stabilization today, he wants to go home and keep up the appointment with oncology next week. He is going home with his family. Diagnosis and metastatic disease with his poor baseline discussed with patient and family.  They are agreeable to have palliative conversation and agreed for outpatient palliative referral. Patient will be prescribed pain medications, nausea medications, Ativan for anxiety and nausea. Further discussions, palliative chemo or palliation and hospice at oncology clinic.       Discharge Diagnoses:  Principal Problem:   Hematemesis Active Problems:   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 ([address])   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 ([place])   Unintentional weight loss   Acute blood loss anemia   Abnormal LFTs   Hypercalcemia   Hyponatremia   Tobacco use   Alcohol abuse   Cocaine abuse (HCC)   Malnutrition of moderate degree   Metastasis from gastric cancer [facility])    Discharge Instructions  Discharge Instructions     Amb Referral to Palliative Care   Complete by: As directed    Call MD for:  extreme fatigue   Complete by: As directed    Call MD  for:  persistant nausea and vomiting   Complete by: As directed    Call MD for:  severe uncontrolled pain   Complete by: As directed     Diet general   Complete by: As directed    Drink plenty of water   Increase activity slowly   Complete by: As directed       Allergies as of 12/12/2021   No Known Allergies      Medication List     STOP taking these medications    aspirin EC 81 MG tablet   azithromycin 250 MG tablet Commonly known as: ZITHROMAX   HYDROcodone-acetaminophen 5-325 MG tablet Commonly known as: Norco   methocarbamol 750 MG tablet Commonly known as: ROBAXIN   oxyCODONE 5 MG immediate release tablet Commonly known as: Oxy IR/ROXICODONE   promethazine 25 MG tablet Commonly known as: PHENERGAN   senna-docusate 8.6-50 MG tablet Commonly known as: Senokot S   trimethoprim-polymyxin b ophthalmic solution Commonly known as: Polytrim       TAKE these medications    dexamethasone 4 MG tablet Commonly known as: DECADRON Take 1 tablet (4 mg total) by mouth daily for 7 days. Start taking on: December 13, 2021   furosemide 40 MG tablet Commonly known as: Lasix Take 1 tablet (40 mg total) by mouth daily for 7 days.   LORazepam 0.5 MG tablet Commonly known as: Ativan Take 1 tablet (0.5 mg total) by mouth 2 (two) times daily for 14 days.   ondansetron 4 MG tablet Commonly known as: ZOFRAN Take 1 tablet (4 mg total) by mouth every 6 (six) hours as needed for nausea. What changed:  how much to take when to take this reasons to take this   pantoprazole 40 MG tablet Commonly known as: PROTONIX Take 1 tablet (40 mg total) by mouth 2 (two) times daily.   sucralfate 1 GM/10ML suspension Commonly known as: CARAFATE Take 10 mLs (1 g total) by mouth 4 (four) times daily -  with meals and at bedtime.   traMADol 50 MG tablet Commonly known as: ULTRAM Take 1 tablet (50 mg total) by mouth every 6 (six) hours as needed for up to 5 days for moderate pain.   TYLENOL 500 MG tablet Generic drug: acetaminophen Take 500-1,000 mg by mouth every 6 (six) hours as needed (for pain).        No  Known Allergies  Consultations: Gastroenterology Oncology, curbside.   Procedures/Studies: CT HEAD W & WO CONTRAST (5MM)  Result Date: 12/06/2021 CLINICAL DATA:  Metastatic disease evaluation. EXAM: CT HEAD WITHOUT AND WITH CONTRAST TECHNIQUE: Contiguous axial images were obtained from the base of the skull through the vertex without and with intravenous contrast. RADIATION DOSE REDUCTION: This exam was performed according to the departmental dose-optimization program which includes automated exposure control, adjustment of the mA and/or kV according to patient size and/or use of iterative reconstruction technique. CONTRAST:  74m OMNIPAQUE IOHEXOL 300 MG/ML  SOLN COMPARISON:  None Available. FINDINGS: Brain: No acute infarct, hemorrhage, or mass lesion is present. Postcontrast images demonstrate no pathologic enhancement. No significant white matter lesions are present. The ventricles are of normal size. No significant extraaxial fluid collection is present. Vascular: No hyperdense vessel or unexpected calcification. Visible vessels are patent. Skull: Calvarium is intact. No focal lytic or blastic lesions are present. No significant extracranial soft tissue lesion is present. Sinuses/Orbits: The paranasal sinuses and mastoid air cells are clear. The globes and orbits are within normal limits. IMPRESSION: Negative CT  of the head without and with contrast. No evidence for metastatic disease. Electronically Signed   By: San Morelle M.D.   On: 12/06/2021 16:00   CT CHEST W CONTRAST  Result Date: 12/06/2021 CLINICAL DATA:  Assess for occult malignancy. Metastatic lesions noted in the liver and bones on a CT of the abdomen and pelvis, 12/05/2021. EXAM: CT CHEST WITH CONTRAST TECHNIQUE: Multidetector CT imaging of the chest was performed during intravenous contrast administration. RADIATION DOSE REDUCTION: This exam was performed according to the departmental dose-optimization program which includes  automated exposure control, adjustment of the mA and/or kV according to patient size and/or use of iterative reconstruction technique. CONTRAST:  53m OMNIPAQUE IOHEXOL 300 MG/ML  SOLN COMPARISON:  CT abdomen and pelvis, 12/05/2021. Previous day's chest radiograph. FINDINGS: Cardiovascular: Heart normal in size and configuration. Left and right coronary artery calcifications. No pericardial effusion. Great vessels are normal in caliber. Minor aortic atherosclerosis. Arch branch vessels are widely patent. Mediastinum/Nodes: No enlarged mediastinal, hilar, or axillary lymph nodes. Thyroid gland, trachea, and esophagus demonstrate no significant findings. Lungs/Pleura: Minor dependent subsegmental atelectasis in the posterior lower lobes. Lungs otherwise clear. Specifically, no lung mass or nodule. No pleural effusion or pneumothorax. Upper Abdomen: No acute findings. Multiple liver masses consistent with metastatic disease as noted on the previous day's exam. Musculoskeletal: Numerous lytic bone lesions most evident along the spine, few along the ribs, several in the sternum and several involving the scapula. No chest wall mass. IMPRESSION: 1. No evidence of a primary malignancy in the chest. 2. No evidence of lung metastatic disease or metastatic mediastinal or hilar lymphadenopathy. 3. Lytic skeletal lesions consistent with metastatic disease, as were noted on the previous day's abdomen and pelvis CT. Aortic Atherosclerosis (ICD10-I70.0). Electronically Signed   By: DLajean ManesM.D.   On: 12/06/2021 15:42   CT Abdomen Pelvis W Contrast  Result Date: 12/05/2021 CLINICAL DATA:  Nausea vomiting.  Abdominal pain. EXAM: CT ABDOMEN AND PELVIS WITH CONTRAST TECHNIQUE: Multidetector CT imaging of the abdomen and pelvis was performed using the standard protocol following bolus administration of intravenous contrast. RADIATION DOSE REDUCTION: This exam was performed according to the departmental dose-optimization  program which includes automated exposure control, adjustment of the mA and/or kV according to patient size and/or use of iterative reconstruction technique. CONTRAST:  1070mOMNIPAQUE IOHEXOL 300 MG/ML  SOLN COMPARISON:  05/30/2021. FINDINGS: Lower chest: Clear lung bases. Hepatobiliary: Numerous hypoattenuating liver masses that are new from the prior CT, largest in the right lobe, segment 7, 4.7 cm. Possible small gallstone. No acute cholecystitis. No bile duct dilation. Pancreas: Unremarkable. No pancreatic ductal dilatation or surrounding inflammatory changes. Spleen: Normal in size without focal abnormality. Adrenals/Urinary Tract: Normal adrenal glands. Kidneys normal size, orientation and position with symmetric enhancement and excretion. Mid to upper pole left renal cyst, 1.1 cm. No follow-up recommended. No other renal masses, no stones and no hydronephrosis. Normal ureters. Bladder is unremarkable. Stomach/Bowel: Stomach is mostly decompressed, otherwise unremarkable. Small bowel is normal in caliber. No wall thickening or inflammation. Stomach is mildly distended, mostly along the ascending and transverse portions, with moderate increased stool burden. No colonic wall thickening. No inflammatory changes. No evidence of a mass. Normal appendix visualized. Vascular/Lymphatic: Aortic atherosclerosis. No aneurysm. Poorly defined enlarged porta hepatis lymph nodes, up to 1.4 cm in short axis. Reproductive: Enlarged prostate, 4.5 x 4.4 cm. Other: No ascites. Musculoskeletal: Numerous lytic bone lesions throughout the visualized spine, as well as the pelvis. No fracture. IMPRESSION: 1. Numerous  liver lesions as well as numerous lytic bone lesions consistent with metastatic disease. Primary malignancy not defined on this exam. 2. No acute findings. 3. Moderate increase in the colonic stool burden. 4. Aortic atherosclerosis. Electronically Signed   By: Lajean Manes M.D.   On: 12/05/2021 14:40   DG Chest Port 1  View  Result Date: 12/05/2021 CLINICAL DATA:  Abdominal pain.  Hematemesis.  Former smoker. EXAM: PORTABLE CHEST 1 VIEW COMPARISON:  12/12/2017. FINDINGS: Cardiac silhouette normal in size. Normal mediastinal and hilar contours. Clear lungs.  No pleural effusion or pneumothorax. Skeletal structures are grossly intact. IMPRESSION: No active disease. Electronically Signed   By: Lajean Manes M.D.   On: 12/05/2021 12:36   (Echo, Carotid, EGD, Colonoscopy, ERCP)    Subjective: Patient seen and examined multiple times today.  Morning rounds he was not having complaints but had difficulty walking as per him.  He wanted to go home, discharge planning done and then he started having vomiting but now relieved. Sister and daughter at the bedside.  I offered patient to stay in the hospital with some IV antibiotics and other supportive measures, however patient is insisting on going home and requested me to discharge him.  Family agreed as he is becoming more anxious and agitated due to the circumstances, new diagnosis and being restricted in the hospital.   Discharge Exam: Vitals:   12/12/21 0438 12/12/21 0441  BP: 99/61 (!) 91/58  Pulse: 63 67  Resp: 18   Temp: 97.7 F (36.5 C)   SpO2: 100% 100%   Vitals:   12/11/21 1300 12/11/21 2207 12/12/21 0438 12/12/21 0441  BP: (!) 96/50 104/63 99/61 (!) 91/58  Pulse: 69 63 63 67  Resp: 18 20 18    Temp: (!) 97.5 F (36.4 C) 98 F (36.7 C) 97.7 F (36.5 C)   TempSrc: Oral Oral Oral   SpO2: 100% 100% 100% 100%  Weight:      Height:        General: Pt is alert, awake, not in acute distress Frail and debilitated.  Not in any distress.  On room air. Cardiovascular: RRR, S1/S2 +, no rubs, no gallops Respiratory: CTA bilaterally, no wheezing, no rhonchi Abdominal: Soft, NT, ND, bowel sounds +, palpable hepatomegaly.  Nontender. Extremities: no edema, no cyanosis    The results of significant diagnostics from this hospitalization (including imaging,  microbiology, ancillary and laboratory) are listed below for reference.     Microbiology: No results found for this or any previous visit (from the past 240 hour(s)).   Labs: BNP (last 3 results) No results for input(s): "BNP" in the last 8760 hours. Basic Metabolic Panel: Recent Labs  Lab 12/06/21 0431 12/07/21 0035 12/09/21 0352 12/10/21 0419 12/11/21 0353 12/12/21 0406  NA 130* 131* 133* 133* 135 133*  K 4.1 4.2 4.1 4.2 4.1 3.8  CL 100 99 100 101 100 101  CO2 23 25 26 26 24 25   GLUCOSE 82 100* 100* 97 118* 87  BUN 15 11 6* 6* 8 13  CREATININE 0.65 0.87 0.81 0.91 0.79 1.01  CALCIUM 10.0 10.6* 11.0* 11.3* 12.0* 12.5*  MG 1.8 1.8 1.4*  --  1.3* 1.7  PHOS 4.1  --   --   --  3.9  --    Liver Function Tests: Recent Labs  Lab 12/06/21 0431 12/07/21 0035 12/09/21 0352 12/11/21 0353 12/12/21 0406  AST 173* 210* 163* 230* 257*  ALT 102* 111* 93* 137* 160*  ALKPHOS 151* 162* 134*  233* 249*  BILITOT 0.6 0.7 0.5 0.4 0.6  PROT 6.2* 6.5 5.6* 6.1* 6.1*  ALBUMIN 3.0* 3.2* 2.7* 2.7* 3.0*   Recent Labs  Lab 12/05/21 1236  LIPASE 29   No results for input(s): "AMMONIA" in the last 168 hours. CBC: Recent Labs  Lab 12/05/21 1236 12/05/21 1943 12/06/21 0431 12/06/21 0800 12/08/21 0616 12/09/21 0352 12/09/21 1719 12/10/21 0419 12/11/21 0353  WBC 12.8*  --  10.3  --   --  8.4  --  8.5 9.3  NEUTROABS 8.9*  --   --   --   --   --   --  5.2 6.9  HGB 12.1*   < > 9.9*   < > 10.5* 8.9* 9.2* 9.2* 9.8*  HCT 35.5*   < > 29.0*   < > 30.8* 26.4* 27.5* 27.3* 28.6*  MCV 85.5  --  86.1  --   --  87.4  --  87.8 86.7  PLT 198  --  175  --   --  177  --  195 200   < > = values in this interval not displayed.   Cardiac Enzymes: No results for input(s): "CKTOTAL", "CKMB", "CKMBINDEX", "TROPONINI" in the last 168 hours. BNP: Invalid input(s): "POCBNP" CBG: Recent Labs  Lab 12/06/21 0753 12/06/21 1619  GLUCAP 72 73   D-Dimer No results for input(s): "DDIMER" in the last 72  hours. Hgb A1c No results for input(s): "HGBA1C" in the last 72 hours. Lipid Profile No results for input(s): "CHOL", "HDL", "LDLCALC", "TRIG", "CHOLHDL", "LDLDIRECT" in the last 72 hours. Thyroid function studies No results for input(s): "TSH", "T4TOTAL", "T3FREE", "THYROIDAB" in the last 72 hours.  Invalid input(s): "FREET3" Anemia work up No results for input(s): "VITAMINB12", "FOLATE", "FERRITIN", "TIBC", "IRON", "RETICCTPCT" in the last 72 hours. Urinalysis    Component Value Date/Time   COLORURINE YELLOW 12/05/2021 1236   APPEARANCEUR CLEAR 12/05/2021 1236   LABSPEC 1.015 12/05/2021 1236   PHURINE 6.0 12/05/2021 1236   GLUCOSEU NEGATIVE 12/05/2021 1236   HGBUR NEGATIVE 12/05/2021 1236   BILIRUBINUR NEGATIVE 12/05/2021 1236   KETONESUR NEGATIVE 12/05/2021 1236   PROTEINUR NEGATIVE 12/05/2021 1236   NITRITE NEGATIVE 12/05/2021 1236   LEUKOCYTESUR NEGATIVE 12/05/2021 1236   Sepsis Labs Recent Labs  Lab 12/06/21 0431 12/09/21 0352 12/10/21 0419 12/11/21 0353  WBC 10.3 8.4 8.5 9.3   Microbiology No results found for this or any previous visit (from the past 240 hour(s)).   Time coordinating discharge: 35 minutes  SIGNED:   Barb Merino, MD  [facility] 12/12/2021, 10:14 AM

## 2021-12-12 NOTE — TOC Transition Note (Signed)
Transition of Care [facility]) - CM/SW Discharge Note   Patient Details  Name: Willie Parker MRN: [id number] Date of Birth: February 25, 1956  Transition of Care [facility]) CM/SW Contact:  Henrietta Dine, RN Phone Number: 12/12/2021, 2:50 PM   Clinical Narrative:   D/C orders placed; order received for RW; spoke with Jasmine at [facility]; they will deliver device to room; the pt's sister will be taking him home; no TOC needs.    Final next level of care: Home/Self Care Barriers to Discharge: No Barriers Identified   Patient Goals and CMS Choice Patient states their goals for this hospitalization and ongoing recovery are:: To go home CMS Medicare.gov Compare Post Acute Care list provided to:: Patient Choice offered to / list presented to : Patient  Discharge Placement                       Discharge Plan and Services In-house Referral: NA Discharge Planning Services: CM Consult Post Acute Care Choice: NA          DME Arranged: Walker rolling DME Agency: [employer] Date DME Agency Contacted: 12/12/21 Time DME Agency Contacted: [phone] Representative spoke with at [place]: [place] Interventions     Readmission Risk Interventions    12/12/2021    2:48 PM 12/12/2021   10:31 AM 12/06/2021   12:05 PM  Readmission Risk Prevention Plan  Transportation Screening Complete  Complete  PCP or Specialist Appt within 5-7 Days   Complete  PCP or Specialist Appt within 3-5 Days Complete Complete   Home Care Screening   Complete  Medication Review (RN CM)   Complete  HRI or Home Care Consult Complete Complete   Social Work Consult for [place] Planning/Counseling Complete Complete   Palliative Care Screening Complete Complete   Medication Review Press photographer) Complete Complete

## 2021-12-12 NOTE — TOC Progression Note (Addendum)
Transition of Care [facility]) - Progression Note    Patient Details  Name: Willie Parker MRN: [id number] Date of Birth: Oct 25, 1955  [facility]) CM/SW Contact  Henrietta Dine, RN Phone Number: 12/12/2021, 10:44 AM  Clinical Narrative:    D/c home cancelled; met with pt/family in room; he is experiencing episode of emesis; he is requesting a walker; please see mobility notes; MD notified; TOC will con't to follow.  [id number]- LVM for rep at [employer] [phone]); awaiting callback.   Expected Discharge Plan: Home/Self Care Barriers to Discharge: No Barriers Identified  Expected Discharge Plan and Services Expected Discharge Plan: Home/Self Care In-house Referral: NA Discharge Planning Services: CM Consult Post Acute Care Choice: NA Living arrangements for the past 2 months: Single Family Home Expected Discharge Date: 12/12/21               DME Arranged: N/A DME Agency: NA                   Social Determinants of Health (SDOH) Interventions    Readmission Risk Interventions    12/12/2021   10:31 AM 12/06/2021   12:05 PM  Readmission Risk Prevention Plan  Transportation Screening  Complete  PCP or Specialist Appt within 5-7 Days  Complete  PCP or Specialist Appt within 3-5 Days Complete   Home Care Screening  Complete  Medication Review (RN CM)  Complete  HRI or Home Care Consult Complete   Social Work Consult for [place] Planning/Counseling Complete   Palliative Care Screening Complete   Medication Review Press photographer) Complete

## 2021-12-12 NOTE — Progress Notes (Signed)
Mobility Specialist - Progress Note   12/12/21 1026  Mobility  Activity Ambulated with assistance in hallway  Level of Assistance Contact guard assist, steadying assist  Assistive Device Front wheel walker  Distance Ambulated (ft) 350 ft  Range of Motion/Exercises Active  Activity Response Tolerated well  Mobility Referral Yes  $Mobility charge 1 Mobility   Pt was found in bed and agreeable to ambulate. Took x1 standing rest break and at EOS returned to bed with all necessities in reach. At EOS began vomiting and RN was notified. Pt was left in room with NT.  Ferd Hibbs Mobility Specialist

## 2021-12-12 NOTE — Progress Notes (Signed)
PT Cancellation Note  Patient Details Name: Willie Parker MRN: [id number] DOB: 08-Jan-1956   Cancelled Treatment:    Reason Eval/Treat Not Completed: Other (comment);PT screened, no needs identified, will sign off;  per Mobility tech notes pt is amb 350 to  700' with RW. Walker ordered per [facility] notes. Progressive mobility section (per RN) states pt is amb independently in hallway.   [facility] 12/12/2021, 4:04 PM

## 2021-12-12 NOTE — Progress Notes (Signed)
Palliative- consult received. Chart reviewed. Patient is discharging today and has been referred to outpatient Palliative by attending MD. Will cancel consult order.   Willie Parker, AGNP-C Palliative Medicine  No charge

## 2021-12-13 ENCOUNTER — Inpatient Hospital Stay (HOSPITAL_COMMUNITY)
Admission: EM | Admit: 2021-12-13 | Discharge: 2021-12-17 | DRG: 640 | Disposition: A | Discharge status: Hospice | Payer: Medicare Other | Attending: Internal Medicine | Admitting: Internal Medicine

## 2021-12-13 ENCOUNTER — Other Ambulatory Visit: Payer: Self-pay

## 2021-12-13 ENCOUNTER — Emergency Department (HOSPITAL_COMMUNITY): Payer: Medicare Other

## 2021-12-13 DIAGNOSIS — R54 Age-related physical debility: Secondary | ICD-10-CM | POA: Diagnosis present

## 2021-12-13 DIAGNOSIS — E43 Unspecified severe protein-calorie malnutrition: Secondary | ICD-10-CM | POA: Diagnosis not present

## 2021-12-13 DIAGNOSIS — C7951 Secondary malignant neoplasm of bone: Secondary | ICD-10-CM | POA: Diagnosis present

## 2021-12-13 DIAGNOSIS — F419 Anxiety disorder, unspecified: Secondary | ICD-10-CM | POA: Diagnosis present

## 2021-12-13 DIAGNOSIS — F17210 Nicotine dependence, cigarettes, uncomplicated: Secondary | ICD-10-CM | POA: Diagnosis present

## 2021-12-13 DIAGNOSIS — R451 Restlessness and agitation: Secondary | ICD-10-CM | POA: Diagnosis present

## 2021-12-13 DIAGNOSIS — D72829 Elevated white blood cell count, unspecified: Secondary | ICD-10-CM

## 2021-12-13 DIAGNOSIS — Z681 Body mass index (BMI) 19 or less, adult: Secondary | ICD-10-CM

## 2021-12-13 DIAGNOSIS — R7989 Other specified abnormal findings of blood chemistry: Secondary | ICD-10-CM | POA: Diagnosis present

## 2021-12-13 DIAGNOSIS — Z96651 Presence of right artificial knee joint: Secondary | ICD-10-CM | POA: Diagnosis present

## 2021-12-13 DIAGNOSIS — E872 Acidosis, unspecified: Secondary | ICD-10-CM

## 2021-12-13 DIAGNOSIS — E860 Dehydration: Secondary | ICD-10-CM

## 2021-12-13 DIAGNOSIS — E861 Hypovolemia: Secondary | ICD-10-CM | POA: Diagnosis present

## 2021-12-13 DIAGNOSIS — C801 Malignant (primary) neoplasm, unspecified: Secondary | ICD-10-CM

## 2021-12-13 DIAGNOSIS — Z7189 Other specified counseling: Secondary | ICD-10-CM

## 2021-12-13 DIAGNOSIS — Z515 Encounter for palliative care: Secondary | ICD-10-CM

## 2021-12-13 DIAGNOSIS — G893 Neoplasm related pain (acute) (chronic): Secondary | ICD-10-CM | POA: Diagnosis present

## 2021-12-13 DIAGNOSIS — H9190 Unspecified hearing loss, unspecified ear: Secondary | ICD-10-CM | POA: Diagnosis present

## 2021-12-13 DIAGNOSIS — E8809 Other disorders of plasma-protein metabolism, not elsewhere classified: Secondary | ICD-10-CM | POA: Diagnosis present

## 2021-12-13 DIAGNOSIS — Z79899 Other long term (current) drug therapy: Secondary | ICD-10-CM

## 2021-12-13 DIAGNOSIS — C169 Malignant neoplasm of stomach, unspecified: Secondary | ICD-10-CM | POA: Diagnosis present

## 2021-12-13 DIAGNOSIS — R627 Adult failure to thrive: Secondary | ICD-10-CM | POA: Diagnosis present

## 2021-12-13 DIAGNOSIS — C799 Secondary malignant neoplasm of unspecified site: Secondary | ICD-10-CM | POA: Diagnosis present

## 2021-12-13 DIAGNOSIS — F1090 Alcohol use, unspecified, uncomplicated: Secondary | ICD-10-CM

## 2021-12-13 DIAGNOSIS — R64 Cachexia: Secondary | ICD-10-CM | POA: Diagnosis present

## 2021-12-13 DIAGNOSIS — Z66 Do not resuscitate: Secondary | ICD-10-CM | POA: Diagnosis present

## 2021-12-13 DIAGNOSIS — C787 Secondary malignant neoplasm of liver and intrahepatic bile duct: Secondary | ICD-10-CM | POA: Diagnosis present

## 2021-12-13 LAB — CBC WITH DIFFERENTIAL/PLATELET
Abs Immature Granulocytes: 0.29 10*3/uL — ABNORMAL HIGH (ref 0.00–0.07)
Basophils Absolute: 0 10*3/uL (ref 0.0–0.1)
Basophils Relative: 0 %
Eosinophils Absolute: 0 10*3/uL (ref 0.0–0.5)
Eosinophils Relative: 0 %
HCT: 31.1 % — ABNORMAL LOW (ref 39.0–52.0)
Hemoglobin: 10.5 g/dL — ABNORMAL LOW (ref 13.0–17.0)
Immature Granulocytes: 2 %
Lymphocytes Relative: 19 %
Lymphs Abs: 2.4 10*3/uL (ref 0.7–4.0)
MCH: 29.2 pg (ref 26.0–34.0)
MCHC: 33.8 g/dL (ref 30.0–36.0)
MCV: 86.4 fL (ref 80.0–100.0)
Monocytes Absolute: 0.8 10*3/uL (ref 0.1–1.0)
Monocytes Relative: 6 %
Neutro Abs: 9 10*3/uL — ABNORMAL HIGH (ref 1.7–7.7)
Neutrophils Relative %: 73 %
Platelets: 212 10*3/uL (ref 150–400)
RBC: 3.6 MIL/uL — ABNORMAL LOW (ref 4.22–5.81)
RDW: 13.2 % (ref 11.5–15.5)
WBC: 12.5 10*3/uL — ABNORMAL HIGH (ref 4.0–10.5)
nRBC: 0.2 % (ref 0.0–0.2)

## 2021-12-13 LAB — LACTIC ACID, PLASMA
Lactic Acid, Venous: 2.2 mmol/L (ref 0.5–1.9)
Lactic Acid, Venous: 1.8 mmol/L (ref 0.5–1.9)

## 2021-12-13 LAB — COMPREHENSIVE METABOLIC PANEL
ALT: 172 U/L — ABNORMAL HIGH (ref 0–44)
AST: 391 U/L — ABNORMAL HIGH (ref 15–41)
Albumin: 3.1 g/dL — ABNORMAL LOW (ref 3.5–5.0)
Alkaline Phosphatase: 277 U/L — ABNORMAL HIGH (ref 38–126)
Anion gap: 7 (ref 5–15)
BUN: 16 mg/dL (ref 8–23)
CO2: 28 mmol/L (ref 22–32)
Calcium: 14.2 mg/dL (ref 8.9–10.3)
Chloride: 99 mmol/L (ref 98–111)
Creatinine, Ser: 0.88 mg/dL (ref 0.61–1.24)
GFR, Estimated: >60 mL/min (ref >60)
Glucose, Bld: 91 mg/dL (ref 70–99)
Potassium: 4.5 mmol/L (ref 3.5–5.1)
Sodium: 134 mmol/L — ABNORMAL LOW (ref 135–145)
Total Bilirubin: 1 mg/dL (ref 0.3–1.2)
Total Protein: 6.5 g/dL (ref 6.5–8.1)

## 2021-12-13 LAB — LIPASE, BLOOD: Lipase: 26 U/L (ref 11–51)

## 2021-12-13 MED ORDER — ONDANSETRON HCL 4 MG/2ML IJ SOLN
4.0000 mg | Freq: Four times a day (QID) | INTRAMUSCULAR | Status: DC | PRN
  Administered 2021-12-15 – 2021-12-17 (×3): 4 mg via INTRAVENOUS
  Filled 2021-12-13 (×3): qty 2

## 2021-12-13 MED ORDER — THIAMINE HCL 100 MG/ML IJ SOLN: 100.0000 mg | Freq: Every day | INTRAMUSCULAR | Status: DC

## 2021-12-13 MED ORDER — ADULT MULTIVITAMIN W/MINERALS CH
1.0000 | ORAL_TABLET | Freq: Every day | ORAL | Status: DC
  Administered 2021-12-14: 1 via ORAL
  Filled 2021-12-13 (×3): qty 1

## 2021-12-13 MED ORDER — SODIUM CHLORIDE 0.9 % IV BOLUS
1000.0000 mL | Freq: Once | INTRAVENOUS | Status: AC
  Administered 2021-12-13: 1000 mL via INTRAVENOUS

## 2021-12-13 MED ORDER — THIAMINE MONONITRATE 100 MG PO TABS
100.0000 mg | ORAL_TABLET | Freq: Every day | ORAL | Status: DC
  Administered 2021-12-14: 100 mg via ORAL
  Filled 2021-12-13 (×3): qty 1

## 2021-12-13 MED ORDER — FOLIC ACID 1 MG PO TABS
1.0000 mg | ORAL_TABLET | Freq: Every day | ORAL | Status: DC
  Administered 2021-12-14: 1 mg via ORAL
  Filled 2021-12-13 (×3): qty 1

## 2021-12-13 MED ORDER — ONDANSETRON HCL 4 MG/2ML IJ SOLN
4.0000 mg | Freq: Once | INTRAMUSCULAR | Status: AC
  Administered 2021-12-13: 4 mg via INTRAVENOUS
  Filled 2021-12-13: qty 2

## 2021-12-13 MED ORDER — PANTOPRAZOLE SODIUM 40 MG IV SOLR
40.0000 mg | Freq: Two times a day (BID) | INTRAVENOUS | Status: DC
  Administered 2021-12-14 – 2021-12-16 (×5): 40 mg via INTRAVENOUS
  Filled 2021-12-13 (×5): qty 10

## 2021-12-13 MED ORDER — HYDROMORPHONE HCL 1 MG/ML IJ SOLN
1.0000 mg | Freq: Once | INTRAMUSCULAR | Status: AC
  Administered 2021-12-13: 1 mg via INTRAVENOUS
  Filled 2021-12-13: qty 1

## 2021-12-13 MED ORDER — HYDROMORPHONE HCL 1 MG/ML IJ SOLN
1.0000 mg | INTRAMUSCULAR | Status: DC | PRN
  Administered 2021-12-14 – 2021-12-17 (×8): 1 mg via INTRAVENOUS
  Filled 2021-12-13 (×8): qty 1

## 2021-12-13 MED ORDER — SODIUM CHLORIDE 0.9 % IV SOLN: INTRAVENOUS | Status: AC

## 2021-12-13 MED ORDER — PANTOPRAZOLE SODIUM 40 MG IV SOLR
40.0000 mg | Freq: Once | INTRAVENOUS | Status: AC
  Administered 2021-12-13: 40 mg via INTRAVENOUS
  Filled 2021-12-13: qty 10

## 2021-12-13 NOTE — H&P (Signed)
History and Physical    Ed Mandich HLK:[id number] DOB: [birth year] DOA: 12/13/2021  PCP: Egbert Garibaldi, PA-C  Patient coming from: Home  Chief Complaint: Abdominal pain  HPI: Willie Parker is a 66 y.o. male with medical history significant of osteoarthritis, PUD, polysubstance abuse (tobacco, cocaine, alcohol).  Recently admitted to the hospital 10/7 for acute GI bleed in the setting of newly diagnosed gastric ulcer with metastatic disease to liver and bones.  Pathology from gastric ulcer was positive for adenocarcinoma.  Oncology was consulted due to hypercalcemia and he was treated with IV fluids, Decadron, and Lasix.  He was discharged yesterday and returns to the ED today complaining of abdominal pain and not eating/drinking.  Vital signs stable.  Labs showing WBC 12.5, hemoglobin 10.5 (stable), calcium 14.2, albumin 3.1, AST 391, ALT 172, alk phos 277, T. bili 1.0, lipase 26, lactic acid 2.2, UA pending.  Chest x-ray showing no active disease. Patient was given Dilaudid, Zofran, IV Protonix 40 mg, and 2 L normal saline boluses.  [facility] called to admit.  Patient states he was feeling better when he left the hospital yesterday but then started having stomach pain again and has not been able to eat or drink anything.  He is endorsing left upper quadrant abdominal pain.  He also vomited at home but no further episodes of vomiting since 2 PM this afternoon.  Denies hematemesis, hematochezia, or melena.  He is endorsing shortness of breath.  Denies chest pain.  States he is scheduled to see an oncologist in 3 days for his new diagnosis of stomach cancer.  States he is no longer drinking alcohol.  Review of Systems:  Review of Systems  All other systems reviewed and are negative.   Past Medical History:  Diagnosis Date   Arthritis    Knee pain    Peptic ulcer     Past Surgical History:  Procedure Laterality Date   BIOPSY  12/08/2021   Procedure: BIOPSY;  Surgeon: Loney Laurence,  DO;  Location: [facility];  Service: Gastroenterology;;   COLONOSCOPY Left 12/08/2021   Procedure: COLONOSCOPY;  Surgeon: Loney Laurence, DO;  Location: [facility];  Service: Gastroenterology;  Laterality: Left;   ESOPHAGOGASTRODUODENOSCOPY (EGD) WITH PROPOFOL Left 12/08/2021   Procedure: ESOPHAGOGASTRODUODENOSCOPY (EGD) WITH PROPOFOL;  Surgeon: Loney Laurence, DO;  Location: [facility];  Service: Gastroenterology;  Laterality: Left;   HOT HEMOSTASIS  12/08/2021   Procedure: HOT HEMOSTASIS (ARGON PLASMA COAGULATION/BICAP);  Surgeon: Loney Laurence, DO;  Location: [facility];  Service: Gastroenterology;;   MANDIBLE FRACTURE SURGERY     when he was 66 year old   POLYPECTOMY  12/08/2021   Procedure: POLYPECTOMY;  Surgeon: Loney Laurence, DO;  Location: [facility];  Service: Gastroenterology;;   TOTAL KNEE ARTHROPLASTY Right 12/19/2017   Procedure: RIGHT TOTAL KNEE ARTHROPLASTY;  Surgeon: Leandrew Koyanagi, MD;  Location: [facility];  Service: Orthopedics;  Laterality: Right;     reports that he has been smoking cigarettes. He has been smoking an average of .25 packs per day. He has never used smokeless tobacco. He reports current alcohol use. He reports current drug use. Drug: Cocaine.  No Known Allergies  Family History  Problem Relation Age of Onset   Diabetes Mother    Hypertension Mother    Hypertension Sister    Diabetes Sister     Prior to Admission medications   Medication Sig Start Date End Date Taking? Authorizing Provider  dexamethasone (DECADRON) 4 MG tablet Take 1 tablet (  4 mg total) by mouth daily for 7 days. 12/13/21 12/20/21  Barb Merino, MD  furosemide (LASIX) 40 MG tablet Take 1 tablet (40 mg total) by mouth daily for 7 days. 12/12/21 12/05/2021  Barb Merino, MD  LORazepam (ATIVAN) 0.5 MG tablet Take 1 tablet (0.5 mg total) by mouth 2 (two) times daily for 14 days. 12/12/21 12/26/21  Barb Merino, MD  ondansetron (ZOFRAN) 4 MG tablet Take 1 tablet  (4 mg total) by mouth every 6 (six) hours as needed for nausea. 12/12/21   Barb Merino, MD  pantoprazole (PROTONIX) 40 MG tablet Take 1 tablet (40 mg total) by mouth 2 (two) times daily. 12/12/21 01/11/22  Barb Merino, MD  sucralfate (CARAFATE) 1 GM/10ML suspension Take 10 mLs (1 g total) by mouth 4 (four) times daily -  with meals and at bedtime. 12/12/21   Barb Merino, MD  traMADol (ULTRAM) 50 MG tablet Take 1 tablet (50 mg total) by mouth every 6 (six) hours as needed for up to 5 days for moderate pain. 12/12/21 12/17/21  Barb Merino, MD  TYLENOL 500 MG tablet Take 500-1,000 mg by mouth every 6 (six) hours as needed (for pain).    [provider]    Physical Exam: Vitals:   12/13/21 1927 12/13/21 1942  BP: 101/68   Pulse: 66   Temp: 97.9 F (36.6 C)   TempSrc: Oral   SpO2: 94%   Weight:  61.2 kg  Height:  6' (1.829 m)    Physical Exam Vitals reviewed.  Constitutional:      Comments: Appears very lethargic  HENT:     Head: Normocephalic and atraumatic.     Mouth/Throat:     Mouth: Mucous membranes are dry.     Comments: Very dry mucous membranes Eyes:     Extraocular Movements: Extraocular movements intact.  Cardiovascular:     Rate and Rhythm: Normal rate and regular rhythm.     Pulses: Normal pulses.  Pulmonary:     Effort: Pulmonary effort is normal. No respiratory distress.     Breath sounds: Normal breath sounds. No wheezing or rales.  Abdominal:     General: Bowel sounds are normal. There is no distension.     Palpations: Abdomen is soft.     Tenderness: There is no abdominal tenderness.  Musculoskeletal:        General: No swelling or tenderness.     Cervical back: Normal range of motion.  Skin:    General: Skin is warm and dry.  Neurological:     General: No focal deficit present.     Mental Status: He is alert and oriented to person, place, and time.     Labs on Admission: I have personally reviewed following labs and imaging  studies  CBC: Recent Labs  Lab 12/09/21 0352 12/09/21 1719 12/10/21 0419 12/11/21 0353 12/13/21 1933  WBC 8.4  --  8.5 9.3 12.5*  NEUTROABS  --   --  5.2 6.9 9.0*  HGB 8.9* 9.2* 9.2* 9.8* 10.5*  HCT 26.4* 27.5* 27.3* 28.6* 31.1*  MCV 87.4  --  87.8 86.7 86.4  PLT 177  --  195 200 585   Basic Metabolic Panel: Recent Labs  Lab 12/07/21 0035 12/09/21 0352 12/10/21 0419 12/11/21 0353 12/12/21 0406 12/13/21 1933  NA 131* 133* 133* 135 133* 134*  K 4.2 4.1 4.2 4.1 3.8 4.5  CL 99 100 101 100 101 99  CO2 _0 GLUCOSE 100* 100*  97 118* 87 91  BUN 11 6* 6* _0 CREATININE 0.87 0.81 0.91 0.79 1.01 0.88  CALCIUM 10.6* 11.0* 11.3* 12.0* 12.5* 14.2*  MG 1.8 1.4*  --  1.3* 1.7  --   PHOS  --   --   --  3.9  --   --    GFR: Estimated Creatinine Clearance: 73.4 mL/min (by C-G formula based on SCr of 0.88 mg/dL). Liver Function Tests: Recent Labs  Lab 12/07/21 0035 12/09/21 0352 12/11/21 0353 12/12/21 0406 12/13/21 1933  AST 210* 163* 230* 257* 391*  ALT 111* 93* 137* 160* 172*  ALKPHOS 162* 134* 233* 249* 277*  BILITOT 0.7 0.5 0.4 0.6 1.0  PROT 6.5 5.6* 6.1* 6.1* 6.5  ALBUMIN 3.2* 2.7* 2.7* 3.0* 3.1*   Recent Labs  Lab 12/13/21 1933  LIPASE 26   No results for input(s): "AMMONIA" in the last 168 hours. Coagulation Profile: No results for input(s): "INR", "PROTIME" in the last 168 hours. Cardiac Enzymes: No results for input(s): "CKTOTAL", "CKMB", "CKMBINDEX", "TROPONINI" in the last 168 hours. BNP (last 3 results) No results for input(s): "PROBNP" in the last 8760 hours. HbA1C: No results for input(s): "HGBA1C" in the last 72 hours. CBG: No results for input(s): "GLUCAP" in the last 168 hours. Lipid Profile: No results for input(s): "CHOL", "HDL", "LDLCALC", "TRIG", "CHOLHDL", "LDLDIRECT" in the last 72 hours. Thyroid Function Tests: No results for input(s): "TSH", "T4TOTAL", "FREET4", "T3FREE", "THYROIDAB" in the last 72 hours. Anemia  Panel: No results for input(s): "VITAMINB12", "FOLATE", "FERRITIN", "TIBC", "IRON", "RETICCTPCT" in the last 72 hours. Urine analysis:    Component Value Date/Time   COLORURINE YELLOW 12/05/2021 1236   APPEARANCEUR CLEAR 12/05/2021 1236   LABSPEC 1.015 12/05/2021 1236   PHURINE 6.0 12/05/2021 1236   GLUCOSEU NEGATIVE 12/05/2021 1236   HGBUR NEGATIVE 12/05/2021 1236   BILIRUBINUR NEGATIVE 12/05/2021 1236   KETONESUR NEGATIVE 12/05/2021 1236   PROTEINUR NEGATIVE 12/05/2021 1236   NITRITE NEGATIVE 12/05/2021 1236   LEUKOCYTESUR NEGATIVE 12/05/2021 1236    Radiological Exams on Admission: DG Chest Port 1 View  Result Date: 12/13/2021 CLINICAL DATA:  Upper abdominal pain EXAM: PORTABLE CHEST 1 VIEW COMPARISON:  12/05/2021 FINDINGS: Heart and mediastinal contours are within normal limits. No focal opacities or effusions. No acute bony abnormality. IMPRESSION: No active disease. Electronically Signed   By: Rolm Baptise M.D.   On: 12/13/2021 20:14    EKG: Independently reviewed.  Sinus rhythm, baseline wander in inferior leads.  No significant change since prior tracing.  Assessment and Plan  Hypercalcemia of malignancy Oncology was consulted during recent hospitalization and he was treated with IV fluids, Decadron, and Lasix.  Calcium 14.9 when corrected for hypoalbuminemia. -Patient received 2 L normal saline boluses in the ED.  Continue IV fluid hydration with normal saline at 150 cc/h. -Continue Decadron after pharmacy med rec is done. -Hold Lasix at this time given soft blood pressure. -Monitor calcium level closely and reconsult oncology if not improving.  Mild leukocytosis Possibly reactive.  No fever or signs of sepsis.  Chest x-ray not suggestive of pneumonia. -UA pending -Repeat CBC in a.m.  Mild lactic acidosis Likely from dehydration.  He has very dry mucous membranes.  No signs of sepsis. 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  Elevated liver enzymes Likely due to liver mets.  No drastic change compared to recent labs. -Continue to monitor  Recent upper GI bleed secondary to malignant gastric ulcer Hemoglobin is stable and patient denies any symptoms of ongoing GI bleed. -Monitor H&H -IV Protonix 40 mg twice daily -Continue sucralfate after pharmacy med rec is done.  Alcohol use disorder Patient states he is not drinking alcohol.  He was just discharged from the hospital yesterday and had no evidence of withdrawal complications. -CIWA monitoring -Thiamine, folate, multivitamin  DVT prophylaxis: SCDs Code Status: Full Code (discussed with the patient) Family Communication: No family available at this time. Level of care: Telemetry bed Admission status: It is my clinical opinion that referral for OBSERVATION is reasonable and necessary in this patient based on the above information provided. The aforementioned taken together are felt to place the patient at high risk for further clinical deterioration. However, it is anticipated that the patient may be medically stable for discharge from the hospital within 24 to 48 hours.   Shela Leff MD [facility]  If 7PM-7AM, please contact night-coverage [email or web address]  12/13/2021, 9:52 PM

## 2021-12-13 NOTE — ED Provider Notes (Signed)
[facility] DEPT Provider Note   CSN: [id number] Arrival date & time: 12/13/21  4008     History {Add pertinent medical, surgical, social history, OB history to HPI:1} Chief Complaint  Patient presents with   Abdominal Pain    Willie Parker is a 66 y.o. male.  He was just discharged from the hospital yesterday after being admitted for abdominal pain and GI bleed found to have a stomach ulcer with mets to liver.  Was noted to be hypercalcemic.  He tells me since discharge he is not been able to eat or drink, his pain is uncontrolled.  He feels generally weak.  He denies further bleeding.  No fevers.  He was discharged with pain medication and steroids Zofran and Ativan PPI and Lasix.  Carafate.  The history is provided by the patient.  Abdominal Pain Pain location:  Generalized Pain quality: aching   Pain severity:  Severe Onset quality:  Gradual Timing:  Constant Progression:  Worsening Chronicity:  Recurrent Relieved by:  Nothing Worsened by:  Nothing Ineffective treatments:  None tried Associated symptoms: flatus, nausea and vomiting   Associated symptoms: no chest pain, no cough, no diarrhea, no fever and no shortness of breath   Risk factors: recent hospitalization        Home Medications Prior to Admission medications   Medication Sig Start Date End Date Taking? Authorizing Provider  dexamethasone (DECADRON) 4 MG tablet Take 1 tablet (4 mg total) by mouth daily for 7 days. 12/13/21 12/20/21  Barb Merino, MD  furosemide (LASIX) 40 MG tablet Take 1 tablet (40 mg total) by mouth daily for 7 days. 12/12/21 12/23/2021  Barb Merino, MD  LORazepam (ATIVAN) 0.5 MG tablet Take 1 tablet (0.5 mg total) by mouth 2 (two) times daily for 14 days. 12/12/21 12/26/21  Barb Merino, MD  ondansetron (ZOFRAN) 4 MG tablet Take 1 tablet (4 mg total) by mouth every 6 (six) hours as needed for nausea. 12/12/21   Barb Merino, MD  pantoprazole  (PROTONIX) 40 MG tablet Take 1 tablet (40 mg total) by mouth 2 (two) times daily. 12/12/21 01/11/22  Barb Merino, MD  sucralfate (CARAFATE) 1 GM/10ML suspension Take 10 mLs (1 g total) by mouth 4 (four) times daily -  with meals and at bedtime. 12/12/21   Barb Merino, MD  traMADol (ULTRAM) 50 MG tablet Take 1 tablet (50 mg total) by mouth every 6 (six) hours as needed for up to 5 days for moderate pain. 12/12/21 12/17/21  Barb Merino, MD  TYLENOL 500 MG tablet Take 500-1,000 mg by mouth every 6 (six) hours as needed (for pain).    [provider]      Allergies    Patient has no known allergies.    Review of Systems   Review of Systems  Constitutional:  Negative for fever.  Respiratory:  Negative for cough and shortness of breath.   Cardiovascular:  Negative for chest pain.  Gastrointestinal:  Positive for abdominal pain, flatus, nausea and vomiting. Negative for diarrhea.    Physical Exam Updated Vital Signs BP 101/68 (BP Location: Left Arm)   Pulse 66   Temp 97.9 F (36.6 C) (Oral)   SpO2 94%  Physical Exam Vitals and nursing note reviewed.  Constitutional:      General: He is not in acute distress.    Appearance: He is well-developed.  HENT:     Head: Normocephalic and atraumatic.  Eyes:     Conjunctiva/sclera: Conjunctivae normal.  Cardiovascular:  Rate and Rhythm: Normal rate and regular rhythm.     Heart sounds: No murmur heard. Pulmonary:     Effort: Pulmonary effort is normal. No respiratory distress.     Breath sounds: Normal breath sounds.  Abdominal:     Tenderness: There is generalized abdominal tenderness. There is no guarding or rebound.  Musculoskeletal:        General: No swelling.     Cervical back: Neck supple.  Skin:    General: Skin is warm and dry.     Capillary Refill: Capillary refill takes less than 2 seconds.  Neurological:     General: No focal deficit present.     Mental Status: He is alert.  Psychiatric:        Mood  and Affect: Mood normal.     ED Results / Procedures / Treatments   Labs (all labs ordered are listed, but only abnormal results are displayed) Labs Reviewed - No data to display  EKG None  Radiology No results found.  Procedures Procedures  {Document cardiac monitor, telemetry assessment procedure when appropriate:1}  Medications Ordered in ED Medications  sodium chloride 0.9 % bolus 1,000 mL (has no administration in time range)  ondansetron (ZOFRAN) injection 4 mg (has no administration in time range)  pantoprazole (PROTONIX) injection 40 mg (has no administration in time range)  HYDROmorphone (DILAUDID) injection 1 mg (has no administration in time range)    ED Course/ Medical Decision Making/ A&P                           Medical Decision Making Amount and/or Complexity of Data Reviewed Labs: ordered. Radiology: ordered.  Risk Prescription drug management.   This patient complains of ***; this involves an extensive number of treatment Options and is a complaint that carries with it a high risk of complications and morbidity. The differential includes ***  I ordered, reviewed and interpreted labs, which included *** I ordered medication *** and reviewed PMP when indicated. I ordered imaging studies which included *** and I independently    visualized and interpreted imaging which showed *** Additional history obtained from *** Previous records obtained and reviewed *** I consulted *** and discussed lab and imaging findings and discussed disposition.  Cardiac monitoring reviewed, *** Social determinants considered, *** Critical Interventions: ***  After the interventions stated above, I reevaluated the patient and found *** Admission and further testing considered, ***   {Document critical care time when appropriate:1} {Document review of labs and clinical decision tools ie heart score, Chads2Vasc2 etc:1}  {Document your independent review of radiology  images, and any outside records:1} {Document your discussion with family members, caretakers, and with consultants:1} {Document social determinants of health affecting pt's care:1} {Document your decision making why or why not admission, treatments were needed:1} Final Clinical Impression(s) / ED Diagnoses Final diagnoses:  None    Rx / DC Orders ED Discharge Orders     None

## 2021-12-13 NOTE — ED Triage Notes (Signed)
BIB EMS from home (hotel). Pt was d/c yesterday after being dx with stomach/kidney/bone CA, came back due to having pain, dizziness, and not eating or drinking for days. 100/64 BP 60 HR Afib 108 cbg

## 2021-12-14 ENCOUNTER — Encounter (HOSPITAL_COMMUNITY): Payer: Self-pay | Admitting: Internal Medicine

## 2021-12-14 DIAGNOSIS — C799 Secondary malignant neoplasm of unspecified site: Secondary | ICD-10-CM

## 2021-12-14 DIAGNOSIS — E861 Hypovolemia: Secondary | ICD-10-CM | POA: Diagnosis present

## 2021-12-14 DIAGNOSIS — R112 Nausea with vomiting, unspecified: Secondary | ICD-10-CM | POA: Diagnosis not present

## 2021-12-14 DIAGNOSIS — H9190 Unspecified hearing loss, unspecified ear: Secondary | ICD-10-CM | POA: Diagnosis present

## 2021-12-14 DIAGNOSIS — E43 Unspecified severe protein-calorie malnutrition: Secondary | ICD-10-CM | POA: Diagnosis present

## 2021-12-14 DIAGNOSIS — Z7189 Other specified counseling: Secondary | ICD-10-CM

## 2021-12-14 DIAGNOSIS — C7951 Secondary malignant neoplasm of bone: Secondary | ICD-10-CM | POA: Diagnosis present

## 2021-12-14 DIAGNOSIS — C787 Secondary malignant neoplasm of liver and intrahepatic bile duct: Secondary | ICD-10-CM | POA: Diagnosis present

## 2021-12-14 DIAGNOSIS — Z66 Do not resuscitate: Secondary | ICD-10-CM | POA: Diagnosis present

## 2021-12-14 DIAGNOSIS — C169 Malignant neoplasm of stomach, unspecified: Secondary | ICD-10-CM

## 2021-12-14 DIAGNOSIS — E860 Dehydration: Secondary | ICD-10-CM

## 2021-12-14 DIAGNOSIS — C161 Malignant neoplasm of fundus of stomach: Secondary | ICD-10-CM | POA: Diagnosis not present

## 2021-12-14 DIAGNOSIS — R54 Age-related physical debility: Secondary | ICD-10-CM | POA: Diagnosis present

## 2021-12-14 DIAGNOSIS — R64 Cachexia: Secondary | ICD-10-CM | POA: Diagnosis present

## 2021-12-14 DIAGNOSIS — E8809 Other disorders of plasma-protein metabolism, not elsewhere classified: Secondary | ICD-10-CM | POA: Diagnosis present

## 2021-12-14 DIAGNOSIS — F419 Anxiety disorder, unspecified: Secondary | ICD-10-CM | POA: Diagnosis present

## 2021-12-14 DIAGNOSIS — R627 Adult failure to thrive: Secondary | ICD-10-CM | POA: Diagnosis present

## 2021-12-14 DIAGNOSIS — E872 Acidosis, unspecified: Secondary | ICD-10-CM | POA: Diagnosis present

## 2021-12-14 DIAGNOSIS — Z515 Encounter for palliative care: Secondary | ICD-10-CM

## 2021-12-14 DIAGNOSIS — Z79899 Other long term (current) drug therapy: Secondary | ICD-10-CM | POA: Diagnosis not present

## 2021-12-14 DIAGNOSIS — F17210 Nicotine dependence, cigarettes, uncomplicated: Secondary | ICD-10-CM | POA: Diagnosis present

## 2021-12-14 DIAGNOSIS — Z681 Body mass index (BMI) 19 or less, adult: Secondary | ICD-10-CM | POA: Diagnosis not present

## 2021-12-14 DIAGNOSIS — R451 Restlessness and agitation: Secondary | ICD-10-CM | POA: Diagnosis present

## 2021-12-14 DIAGNOSIS — Z96651 Presence of right artificial knee joint: Secondary | ICD-10-CM | POA: Diagnosis present

## 2021-12-14 DIAGNOSIS — G893 Neoplasm related pain (acute) (chronic): Secondary | ICD-10-CM | POA: Diagnosis present

## 2021-12-14 LAB — CBC
HCT: 26.8 % — ABNORMAL LOW (ref 39.0–52.0)
Hemoglobin: 8.9 g/dL — ABNORMAL LOW (ref 13.0–17.0)
MCH: 29.5 pg (ref 26.0–34.0)
MCHC: 33.2 g/dL (ref 30.0–36.0)
MCV: 88.7 fL (ref 80.0–100.0)
Platelets: 190 10*3/uL (ref 150–400)
RBC: 3.02 MIL/uL — ABNORMAL LOW (ref 4.22–5.81)
RDW: 13.5 % (ref 11.5–15.5)
WBC: 10.8 10*3/uL — ABNORMAL HIGH (ref 4.0–10.5)
nRBC: 0.2 % (ref 0.0–0.2)

## 2021-12-14 LAB — COMPREHENSIVE METABOLIC PANEL
ALT: 170 U/L — ABNORMAL HIGH (ref 0–44)
AST: 369 U/L — ABNORMAL HIGH (ref 15–41)
Albumin: 2.7 g/dL — ABNORMAL LOW (ref 3.5–5.0)
Alkaline Phosphatase: 247 U/L — ABNORMAL HIGH (ref 38–126)
Anion gap: 6 (ref 5–15)
BUN: 18 mg/dL (ref 8–23)
CO2: 25 mmol/L (ref 22–32)
Calcium: 13.8 mg/dL (ref 8.9–10.3)
Chloride: 106 mmol/L (ref 98–111)
Creatinine, Ser: 0.9 mg/dL (ref 0.61–1.24)
GFR, Estimated: >60 mL/min (ref >60)
Glucose, Bld: 81 mg/dL (ref 70–99)
Potassium: 3.8 mmol/L (ref 3.5–5.1)
Sodium: 137 mmol/L (ref 135–145)
Total Bilirubin: 0.7 mg/dL (ref 0.3–1.2)
Total Protein: 5.6 g/dL — ABNORMAL LOW (ref 6.5–8.1)

## 2021-12-14 LAB — URINALYSIS, ROUTINE W REFLEX MICROSCOPIC
Bilirubin Urine: NEGATIVE
Glucose, UA: NEGATIVE mg/dL
Hgb urine dipstick: NEGATIVE
Ketones, ur: NEGATIVE mg/dL
Leukocytes,Ua: NEGATIVE
Nitrite: NEGATIVE
Protein, ur: NEGATIVE mg/dL
Specific Gravity, Urine: 1.011 (ref 1.005–1.030)
pH: 5 (ref 5.0–8.0)

## 2021-12-14 LAB — CALCIUM, IONIZED: Calcium, Ionized, Serum: 7.7 mg/dL — ABNORMAL HIGH (ref 4.5–5.6)

## 2021-12-14 LAB — MAGNESIUM: Magnesium: 1.4 mg/dL — ABNORMAL LOW (ref 1.7–2.4)

## 2021-12-14 MED ORDER — FUROSEMIDE 10 MG/ML IJ SOLN
40.0000 mg | Freq: Once | INTRAMUSCULAR | Status: AC
  Administered 2021-12-14: 40 mg via INTRAVENOUS
  Filled 2021-12-14: qty 4

## 2021-12-14 MED ORDER — MAGNESIUM SULFATE 2 GM/50ML IV SOLN
2.0000 g | Freq: Once | INTRAVENOUS | Status: AC
  Administered 2021-12-14: 2 g via INTRAVENOUS
  Filled 2021-12-14: qty 50

## 2021-12-14 MED ORDER — CALCITONIN (SALMON) 200 UNIT/ML IJ SOLN
6.0000 [IU]/kg | Freq: Every day | INTRAMUSCULAR | Status: AC
  Administered 2021-12-14 – 2021-12-15 (×2): 368 [IU] via SUBCUTANEOUS
  Filled 2021-12-14 (×2): qty 1.84

## 2021-12-14 MED ORDER — SODIUM CHLORIDE 0.9 % IV SOLN: INTRAVENOUS | Status: DC

## 2021-12-14 NOTE — Progress Notes (Signed)
PROGRESS NOTE    Willie Parker  DJS:[id number] DOB: 1955/03/18 DOA: 12/13/2021 PCP: Egbert Garibaldi, PA-C    Brief Narrative:  66 year old gentleman with recently diagnosed malignant gastric cancer with liver and bone mets, discharged 2 days ago presents back with ongoing abdominal pain nausea and not tolerating diet.  In the emergency room calcium 14.2 with albumin of 3.1, mildly elevated transaminases.  Admitted with hypercalcemia, nausea vomiting.   Assessment & Plan:   Hypercalcemia of malignancy, corrected calcium more than 15 Gastric adenocarcinoma with metastasis to liver and bones, recently diagnosed. Intractable nausea vomiting and abdominal pain secondary to metastatic disease.  Continue IV fluid with normal saline, increase rate 150 mL/h.  Check calcium every day.  Mental status is stable.  1 dose of Lasix now. Discussing with oncology about zoledronic acid and calcitonin. Recently diagnosed metastatic cancer, was a scheduled to see oncology in the office 10/18, however he is not able to make it.  I have asked inpatient consultation for treatment planning, prognostication. IV fluid, nausea medications, adequate pain medications.  Severe hypercalcemia and metastatic cancer with severe frailty and debility carries very poor prognosis, will start palliative discussions.  Hypomagnesemia: Replace aggressively further.  Continue to monitor.  Upper GI bleeding secondary to malignant gastric ulcer: Currently no evidence of bleeding.  Monitor on PPI.   DVT prophylaxis: SCDs Start: 12/13/21 2222   Code Status: Full code, palliative care consulted Family Communication: None Disposition Plan: Status is: Inpatient Remains inpatient appropriate because: Severe hypercalcemia     Consultants:  Oncology Palliative  Procedures:  None  Antimicrobials:  None   Subjective: Patient was seen and examined.  Looks tired and complains of some epigastric pain.  Denies any other  complaints.  No active nausea vomiting.  Objective: Vitals:   12/14/21 0141 12/14/21 0159 12/14/21 0612 12/14/21 1020  BP: (!) 102/50  (!) 98/49 101/65  Pulse: (!) 166 66 60 79  Resp: '16  18 16  '$ Temp: 98.3 F (36.8 C)  98 F (36.7 C) 98.3 F (36.8 C)  TempSrc: Oral  Oral Oral  SpO2: 100%  97% 100%  Weight:      Height:        Intake/Output Summary (Last 24 hours) at 12/14/2021 1334 Last data filed at 12/14/2021 1235 Gross per 24 hour  Intake 1789.44 ml  Output 1225 ml  Net 564.44 ml   Filed Weights   12/13/21 1942  Weight: 61.2 kg    Examination:  General exam: Appears frail and debilitated.  Flat affect.  Laying in bed. Respiratory system: Clear to auscultation. Respiratory effort normal.  No added sounds. Cardiovascular system: S1 & S2 heard, RRR. No JVD, murmurs, rubs, gallops or clicks. No pedal edema. Gastrointestinal system: Abdomen is nondistended, soft and mild tenderness in the epigastrium.  Palpable liver.   Central nervous system: Alert and oriented. No focal neurological deficits. Extremities: Symmetric 5 x 5 power. Skin: No rashes, lesions or ulcers Psychiatry: Judgement and insight appear normal.  Flat affect.    Data Reviewed: I have personally reviewed following labs and imaging studies  CBC: Recent Labs  Lab 12/09/21 0352 12/09/21 1719 12/10/21 0419 12/11/21 0353 12/13/21 1933 12/14/21 0342  WBC 8.4  --  8.5 9.3 12.5* 10.8*  NEUTROABS  --   --  5.2 6.9 9.0*  --   HGB 8.9* 9.2* 9.2* 9.8* 10.5* 8.9*  HCT 26.4* 27.5* 27.3* 28.6* 31.1* 26.8*  MCV 87.4  --  87.8 86.7 86.4 88.7  PLT 177  --  195 200 212 101   Basic Metabolic Panel: Recent Labs  Lab 12/09/21 0352 12/10/21 0419 12/11/21 0353 12/12/21 0406 12/13/21 1933 12/14/21 0342  NA 133* 133* 135 133* 134* 137  K 4.1 4.2 4.1 3.8 4.5 3.8  CL 100 101 100 101 99 106  CO2 '26 26 24 25 28 25  '$ GLUCOSE 100* 97 118* 87 91 81  BUN 6* 6* '8 13 16 18  '$ CREATININE 0.81 0.91 0.79 1.01 0.88 0.90   CALCIUM 11.0* 11.3* 12.0* 12.5* 14.2* 13.8*  MG 1.4*  --  1.3* 1.7  --  1.4*  PHOS  --   --  3.9  --   --   --    GFR: Estimated Creatinine Clearance: 71.8 mL/min (by C-G formula based on SCr of 0.9 mg/dL). Liver Function Tests: Recent Labs  Lab 12/09/21 0352 12/11/21 0353 12/12/21 0406 12/13/21 1933 12/14/21 0342  AST 163* 230* 257* 391* 369*  ALT 93* 137* 160* 172* 170*  ALKPHOS 134* 233* 249* 277* 247*  BILITOT 0.5 0.4 0.6 1.0 0.7  PROT 5.6* 6.1* 6.1* 6.5 5.6*  ALBUMIN 2.7* 2.7* 3.0* 3.1* 2.7*   Recent Labs  Lab 12/13/21 1933  LIPASE 26   No results for input(s): "AMMONIA" in the last 168 hours. Coagulation Profile: No results for input(s): "INR", "PROTIME" in the last 168 hours. Cardiac Enzymes: No results for input(s): "CKTOTAL", "CKMB", "CKMBINDEX", "TROPONINI" in the last 168 hours. BNP (last 3 results) No results for input(s): "PROBNP" in the last 8760 hours. HbA1C: No results for input(s): "HGBA1C" in the last 72 hours. CBG: No results for input(s): "GLUCAP" in the last 168 hours. Lipid Profile: No results for input(s): "CHOL", "HDL", "LDLCALC", "TRIG", "CHOLHDL", "LDLDIRECT" in the last 72 hours. Thyroid Function Tests: No results for input(s): "TSH", "T4TOTAL", "FREET4", "T3FREE", "THYROIDAB" in the last 72 hours. Anemia Panel: No results for input(s): "VITAMINB12", "FOLATE", "FERRITIN", "TIBC", "IRON", "RETICCTPCT" in the last 72 hours. Sepsis Labs: Recent Labs  Lab 12/13/21 1933 12/13/21 2133  LATICACIDVEN 2.2* 1.8    No results found for this or any previous visit (from the past 240 hour(s)).       Radiology Studies: DG Chest Port 1 View  Result Date: 12/13/2021 CLINICAL DATA:  Upper abdominal pain EXAM: PORTABLE CHEST 1 VIEW COMPARISON:  12/05/2021 FINDINGS: Heart and mediastinal contours are within normal limits. No focal opacities or effusions. No acute bony abnormality. IMPRESSION: No active disease. Electronically Signed   By: Rolm Baptise M.D.   On: 12/13/2021 20:14        Scheduled Meds:  folic acid  1 mg Oral Daily   multivitamin with minerals  1 tablet Oral Daily   pantoprazole (PROTONIX) IV  40 mg Intravenous Q12H   thiamine  100 mg Oral Daily   Or   thiamine  100 mg Intravenous Daily   Continuous Infusions:  sodium chloride 150 mL/hr at 12/14/21 1236     LOS: 0 days    Time spent: 35 minutes    Barb Merino, MD [facility] Pager [phone]

## 2021-12-14 NOTE — Progress Notes (Signed)
Date and time results received: 12/14/21 0444 am (use smartphrase ".now" to insert current time)  Test: Calcium level Critical Value: [place] 50.3  Name of Provider Notified: Gershon Cull, NP  Orders Received? Or Actions Taken?: no new orders.

## 2021-12-14 NOTE — Progress Notes (Signed)
[id number] Liaison note:  Notified via [place] from Dr. Barb Merino of request for [place] services. Will continue to follow for disposition.  Please call with any outpatient palliative questions or concerns.  Thank you for the opportunity to participate in this patient's care.  Thank you, Lorelee Market, LPN [facility] Liaison [phone]

## 2021-12-14 NOTE — Consult Note (Signed)
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  Patient had recently been hospitalized from 10/7 to 10/14 where diagnosis of gastric adenocarcinoma was made.  Patient had been referred to oncology and outpatient palliative care at time of discharge though unfortunately was not out of the hospital long enough to make these appointments.  Since being admitted patient has received aggressive therapies for management of his hypercalcemia in the setting of his malignancy.  Palliative care consulted to assist with complex medical decision-making.  Extensive review of EMR prior to seeing patient.  Presented to bedside and introduced myself and the role of the palliative care team and patient's care.  Patient noted to be laying in bed; appears frail and cachectic.  Patient denied any pain when seeing him though noted that he still does not feel like eating.  Patient did request ice chips though. Noted would inform team of this request.   Discussed updates patient had heard regarding his medical care.  Patient heard during his recent hospitalization that he has stomach cancer that has gone to his liver.  Patient is wondering if he can talk with oncology while he is admitted since he was unable to stay out of the hospital long enough for his follow-up appointment on 10/18.  Noted  would inform primary hospitalist of his request.  Also expressed that medical providers want to make we provide interventions that will benefit the patient while minimizing risks of harm.  Did encourage continued discussions with oncology if able to answer patient's questions regarding cancer therapies. Patient hoping for further answers from oncology which would aid in further medical discussions moving forward.  Inquired what patient was hoping for moving forward and he noted he wants to "get healthy".  Things that brought him joy were playing pool and watching sports.  Patient expressed that his brother and nephew live with him.  He does have 4 children.  With permission in light of patient knowing his cancer has gone to other places, we did discuss that should patient deemed appropriate for cancer therapies, it is in setting of trying to slow cancer down and not cure it because it is already gone to multiple places in his body.  Patient voiced hearing loss.  Inquired with patient if he was unable to make medical decisions for himself, who would be want medical decisions for him.  Patient stated he would want his daughter, Willie Parker, to be involved in making medical decisions for him.  Noted we did not have daughter's contact information in the chart so patient provided phone number to add to his contacts.  Encouraged patient to complete [facility] POA documentation to confirm his wishes as he does have 4 children.  Patient agreed with wanting to complete this.  Noted would reach out to palliative care chaplain for assistance.  Provided emotional support while present.  Patient at this time wants  further discussions with oncology which is expected.  Noted palliative care team continue to follow along with patient's medical course.  Thank patient for allowing palliative care team to visit today.  Primary Diagnoses  Present on Admission:  Hypercalcemia  Metastasis from gastric cancer ([place])  Abnormal  LFTs  Palliative Review of Systems: Admitted to loss of appetite when present.  Denied pain when this provider was present.   I have reviewed the medical record, interviewed the patient and family, and examined the patient. The following aspects are pertinent.  Past Medical History:  Diagnosis Date   Arthritis    Knee pain    Peptic ulcer    Social History   Socioeconomic History   Marital status: Divorced    Spouse name: Not on file   Number of children: Not on file   Years of education: Not on file   Highest education level: Not on file  Occupational History   Not on file  Tobacco Use   Smoking status: Every Day    Packs/day: 0.25    Types: Cigarettes   Smokeless tobacco: Never  Vaping Use   Vaping Use: Never used  Substance and Sexual Activity   Alcohol use: Yes   Drug use: Yes    Types: Cocaine    Comment: last use 12/03/21   Sexual activity: Not Currently  Other Topics Concern   Not on file  Social History Narrative   Not on file   Social Determinants of Health   Financial Resource Strain: Not on file  Food Insecurity: No Food Insecurity (12/06/2021)   Hunger Vital Sign    Worried About Running Out of Food in the Last Year: Never true    Ran Out of Food in the Last Year: Never true  Transportation Needs: No Transportation Needs (12/06/2021)   PRAPARE - Hydrologist (Medical): No    Lack of Transportation (Non-Medical): No  Physical Activity: Not on file  Stress: Not on file  Social Connections: Not on file   Family History  Problem Relation Age of Onset   Diabetes Mother    Hypertension Mother    Hypertension Sister    Diabetes Sister    Scheduled Meds:  folic acid  1 mg Oral Daily   multivitamin with minerals  1 tablet Oral Daily   pantoprazole (PROTONIX) IV  40 mg Intravenous Q12H   thiamine  100 mg Oral Daily   Or   thiamine  100 mg Intravenous Daily   Continuous Infusions: PRN Meds:.HYDROmorphone (DILAUDID)  injection, ondansetron (ZOFRAN) IV No Known Allergies CBC:    Component Value Date/Time   WBC 10.8 (H) 12/14/2021 0342   HGB 8.9 (L) 12/14/2021 0342   HGB 15.6 11/15/2017 1515   HCT 26.8 (L) 12/14/2021 0342   HCT 45.0 11/15/2017 1515   PLT 190 12/14/2021 0342   PLT 229 11/15/2017 1515   MCV 88.7 12/14/2021 0342   MCV 85 11/15/2017 1515   NEUTROABS 9.0 (H) 12/13/2021 1933   LYMPHSABS 2.4 12/13/2021 1933   MONOABS 0.8 12/13/2021 1933   EOSABS 0.0 12/13/2021 1933   BASOSABS 0.0 12/13/2021 1933   Comprehensive Metabolic Panel:    Component Value Date/Time   NA 137 12/14/2021 0342   NA 142 11/15/2017 1515   K 3.8 12/14/2021 0342   CL 106 12/14/2021 0342   CO2 25 12/14/2021 0342   BUN 18 12/14/2021 0342   BUN 14 11/15/2017 1515   CREATININE 0.90 12/14/2021 0342  GLUCOSE 81 12/14/2021 0342   CALCIUM 13.8 (HH) 12/14/2021 0342   AST 369 (H) 12/14/2021 0342   ALT 170 (H) 12/14/2021 0342   ALKPHOS 247 (H) 12/14/2021 0342   BILITOT 0.7 12/14/2021 0342   BILITOT <0.2 11/15/2017 1515   PROT 5.6 (L) 12/14/2021 0342   PROT 7.6 11/15/2017 1515   ALBUMIN 2.7 (L) 12/14/2021 0342   ALBUMIN 4.7 11/15/2017 1515    Physical Exam: Vital Signs: BP 101/65   Pulse 79   Temp 98.3 F (36.8 C) (Oral)   Resp 16   Ht 6' (1.829 m)   Wt 61.2 kg   SpO2 100%   BMI 18.31 kg/m  SpO2: SpO2: 100 % O2 Device: O2 Device: Room Air O2 Flow Rate:   Intake/output summary:  Intake/Output Summary (Last 24 hours) at 12/14/2021 1037 Last data filed at 12/14/2021 9528 Gross per 24 hour  Intake 1109.32 ml  Output 625 ml  Net 484.32 ml   LBM: Last BM Date : 12/11/21 Baseline Weight: Weight: 61.2 kg Most recent weight: Weight: 61.2 kg  General: NAD, alert, laying in bed, frail, cachectic Eyes: conjunctiva clear, anicteric sclera HENT: normocephalic, atraumatic, dry mucous membranes Cardiovascular: RRR Respiratory: no increase work of breathing noted, not in respiratory distress Abdomen: Not  distended Extremities: no edema in LE b/l Skin: no rashes or lesions on visible skin Neuro: A&Ox4, following commands easily Psych: appropriately answers all questions, withdrawn          Palliative Performance Scale: 60%          Additional Data Reviewed: Recent Labs    12/13/21 1933 12/14/21 0342  WBC 12.5* 10.8*  HGB 10.5* 8.9*  PLT 212 190  NA 134* 137  BUN 16 18  CREATININE 0.88 0.90    Imaging: DG Chest Port 1 View CLINICAL DATA:  Upper abdominal pain  EXAM: PORTABLE CHEST 1 VIEW  COMPARISON:  12/05/2021  FINDINGS: Heart and mediastinal contours are within normal limits. No focal opacities or effusions. No acute bony abnormality.  IMPRESSION: No active disease.  Electronically Signed   By: Rolm Baptise M.D.   On: 12/13/2021 20:14    I personally reviewed recent imaging.   Palliative Care Assessment and Plan Summary of Established Goals of Care and Medical Treatment Preferences    # Complex medical decision making/goals of care  -Patient able to express today he heard during his prior hospitalization regarding his diagnosis of stomach cancer that it has gone to other places in his body including his liver.  Patient wanting to discuss if cancer directed therapies are appropriate in his current status.  Oncology has been consulted to see the patient during hospitalization for further discussions as he was unable to make it out of the hospital for his outpatient follow-up.  -Based on discussions with oncology, will assist in guiding further conversations from the palliative care team with the patient.  -Patient stated that if he was unable to make decisions for himself he would want his daughter, Willie Parker, to assist in making medical decisions for him.  Asked for assistance from palliative care and completion of HCPOA documentation.  -  Code Status: Full Code    -Spent today building rapport and so did not address this topic.  Patient desires further input  from oncology prior to making decisions regarding his medical care.   # Symptom management  Patient denied symptoms of pain while this provider was present in room.  If pain interventions are needed moving forward,  may need to consider interventions such as NSAIDs prn or steroids.  Patient recently positive for cocaine and opiates on UDS on 10/7.  Depending on goals for medical care moving forward, would optimize non-opioid pain management options. May also want to consider IR involvement for celiac plexus block as this would assist in minimizing opioid use.   # Psycho-social/Spiritual Support:  - Support System: Patient stated that if he was unable to make decisions for himself he would want his daughter, Willie Parker, to assist in making medical decisions for him. Placed contact information provided by patient into EMR.  - Desire for further Chaplain support: Involved palliative care chaplain to assist in [place] documentation completion and spiritual support  # Discharge Planning: TBD  Thank you for allowing the palliative care team to participate in the care [place].  Chelsea Aus, DO Palliative Care Provider PMT # [phone]  This provider spent a total of 81 minutes providing patient's care.  Includes review of EMR, discussing care with other staff members involved in patient's medical care, obtaining relevant history and information from patient and/or patient's family, and personal review of imaging and lab work. Greater than 50% of the time was spent counseling and coordinating care related to the above assessment and plan.

## 2021-12-15 ENCOUNTER — Encounter: Payer: Self-pay | Admitting: *Deleted

## 2021-12-15 DIAGNOSIS — R112 Nausea with vomiting, unspecified: Secondary | ICD-10-CM | POA: Diagnosis not present

## 2021-12-15 DIAGNOSIS — F1490 Cocaine use, unspecified, uncomplicated: Secondary | ICD-10-CM

## 2021-12-15 DIAGNOSIS — C7951 Secondary malignant neoplasm of bone: Secondary | ICD-10-CM

## 2021-12-15 DIAGNOSIS — R5383 Other fatigue: Secondary | ICD-10-CM

## 2021-12-15 DIAGNOSIS — R634 Abnormal weight loss: Secondary | ICD-10-CM

## 2021-12-15 DIAGNOSIS — C787 Secondary malignant neoplasm of liver and intrahepatic bile duct: Secondary | ICD-10-CM

## 2021-12-15 DIAGNOSIS — F1090 Alcohol use, unspecified, uncomplicated: Secondary | ICD-10-CM

## 2021-12-15 DIAGNOSIS — C161 Malignant neoplasm of fundus of stomach: Secondary | ICD-10-CM

## 2021-12-15 DIAGNOSIS — F17210 Nicotine dependence, cigarettes, uncomplicated: Secondary | ICD-10-CM

## 2021-12-15 LAB — COMPREHENSIVE METABOLIC PANEL
ALT: 242 U/L — ABNORMAL HIGH (ref 0–44)
AST: 520 U/L — ABNORMAL HIGH (ref 15–41)
Albumin: 2.5 g/dL — ABNORMAL LOW (ref 3.5–5.0)
Alkaline Phosphatase: 292 U/L — ABNORMAL HIGH (ref 38–126)
Anion gap: 11 (ref 5–15)
BUN: 21 mg/dL (ref 8–23)
CO2: 22 mmol/L (ref 22–32)
Calcium: 13 mg/dL — ABNORMAL HIGH (ref 8.9–10.3)
Chloride: 104 mmol/L (ref 98–111)
Creatinine, Ser: 1.06 mg/dL (ref 0.61–1.24)
GFR, Estimated: >60 mL/min (ref >60)
Glucose, Bld: 61 mg/dL — ABNORMAL LOW (ref 70–99)
Potassium: 3.8 mmol/L (ref 3.5–5.1)
Sodium: 137 mmol/L (ref 135–145)
Total Bilirubin: 1 mg/dL (ref 0.3–1.2)
Total Protein: 5.4 g/dL — ABNORMAL LOW (ref 6.5–8.1)

## 2021-12-15 LAB — IRON AND TIBC
Iron: 37 ug/dL — ABNORMAL LOW (ref 45–182)
Saturation Ratios: 14 % — ABNORMAL LOW (ref 17.9–39.5)
TIBC: 268 ug/dL (ref 250–450)
UIBC: 231 ug/dL

## 2021-12-15 LAB — PHOSPHORUS: Phosphorus: 2.9 mg/dL (ref 2.5–4.6)

## 2021-12-15 LAB — FERRITIN: Ferritin: 686 ng/mL — ABNORMAL HIGH (ref 24–336)

## 2021-12-15 LAB — PREALBUMIN: Prealbumin: 11 mg/dL — ABNORMAL LOW (ref 18–38)

## 2021-12-15 LAB — MAGNESIUM: Magnesium: 1.6 mg/dL — ABNORMAL LOW (ref 1.7–2.4)

## 2021-12-15 MED ORDER — MAGNESIUM SULFATE 2 GM/50ML IV SOLN
2.0000 g | Freq: Once | INTRAVENOUS | Status: AC
  Administered 2021-12-15: 2 g via INTRAVENOUS
  Filled 2021-12-15: qty 50

## 2021-12-15 MED ORDER — TRAMADOL HCL 50 MG PO TABS
100.0000 mg | ORAL_TABLET | Freq: Four times a day (QID) | ORAL | Status: DC
  Administered 2021-12-15 – 2021-12-17 (×5): 100 mg via ORAL
  Filled 2021-12-15 (×5): qty 2

## 2021-12-15 NOTE — Progress Notes (Signed)
Daily Progress Note   Patient Name: Willie Parker       Date: 12/15/2021 DOB: [birth year]  Age: 66 y.o. MRN#: [id number] Attending Physician: Barb Merino, MD Primary Care Physician: Emelia Loron Admit Date: 12/13/2021  Reason for Consultation/Follow-up: Establishing goals of care  Subjective:  Resting in bed, ongoing weakness and functional decline.   Length of Stay: 1  Current Medications: Scheduled Meds:   folic acid  1 mg Oral Daily   multivitamin with minerals  1 tablet Oral Daily   pantoprazole (PROTONIX) IV  40 mg Intravenous Q12H   thiamine  100 mg Oral Daily   Or   thiamine  100 mg Intravenous Daily    Continuous Infusions:  sodium chloride 150 mL/hr at 12/15/21 0400    PRN Meds: HYDROmorphone (DILAUDID) injection, ondansetron (ZOFRAN) IV  Physical Exam         Frail cachectic Resting in bed No edema Abdomen not distended  Vital Signs: BP 101/62 (BP Location: Right Arm)   Pulse 96   Temp 98.3 F (36.8 C) (Oral)   Resp 16   Ht 6' (1.829 m)   Wt 61.2 kg   SpO2 99%   BMI 18.31 kg/m  SpO2: SpO2: 99 % O2 Device: O2 Device: Room Air O2 Flow Rate:    Intake/output summary:  Intake/Output Summary (Last 24 hours) at 12/15/2021 1206 Last data filed at 12/15/2021 0900 Gross per 24 hour  Intake 2962.65 ml  Output 4350 ml  Net -1387.35 ml   LBM: Last BM Date : 12/11/21 Baseline Weight: Weight: 61.2 kg Most recent weight: Weight: 61.2 kg       Palliative Assessment/Data:      Patient Active Problem List   Diagnosis Date Noted   Hypercalcemia of malignancy 12/14/2021   Palliative care encounter 12/14/2021   Goals of care, counseling/discussion 12/14/2021   Dehydration 12/14/2021   Leukocytosis 12/13/2021   Lactic acidosis 12/13/2021    Alcohol use disorder 12/13/2021   Metastasis from gastric cancer ([place]) 12/10/2021   Malnutrition of moderate degree 12/08/2021   Hematemesis 12/05/2021   Unintentional weight loss 12/05/2021   Acute blood loss anemia 12/05/2021   Abnormal LFTs 12/05/2021   Hypercalcemia 12/05/2021   Hyponatremia 12/05/2021   Tobacco use 12/05/2021   Alcohol abuse 12/05/2021   Cocaine abuse ([place]) 12/05/2021  Primary osteoarthritis of right knee 12/19/2017   S/P total knee arthroplasty, right 12/19/2017    Palliative Care Assessment & Plan   Patient Profile:    Assessment:  PPS 20% Patient is a 66 year old gentleman with a past medical history of OA, PUD, and polysubstance abuse (tobacco, alcohol, cocaine with most recent UDS on 10/7 positive for opiates and cocaine) and recent 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  Patient had recently been hospitalized from 10/7 to 10/14 where diagnosis of gastric adenocarcinoma was made.  Patient had been referred to oncology and outpatient palliative care at time of discharge though unfortunately was not out of the hospital long enough to make these appointments.  Since being admitted patient has received aggressive therapies for management of his hypercalcemia in the setting of his malignancy.  Palliative care consulted to assist with complex medical decision-making.  Recommendations/Plan:   DNR DNI Comfort care Residential hospice  These recommendations were put forth after a family meeting with 2 daughters and sister were held.   Goals of Care and Additional Recommendations: Limitations on Scope of Treatment: Full Comfort Care  Code Status:    Code Status Orders  (From admission, onward)           Start     Ordered   12/15/21 1205  Do not attempt resuscitation (DNR)  Continuous       Question  Answer Comment  In the event of cardiac or respiratory ARREST Do not call a "code blue"   In the event of cardiac or respiratory ARREST Do not perform Intubation, CPR, defibrillation or ACLS   In the event of cardiac or respiratory ARREST Use medication by any route, position, wound care, and other measures to relive pain and suffering. May use oxygen, suction and manual treatment of airway obstruction as needed for comfort.      12/15/21 1204           Code Status History     Date Active Date Inactive Code Status Order ID Comments User Context   12/13/2021 2222 12/15/2021 1204 Full Code [id number]  Shela Leff, MD ED   12/05/2021 1631 12/12/2021 2212 Full Code [id number]  Reubin Milan, MD ED   12/19/2017 1508 12/21/2017 1701 Full Code [id number]  Leandrew Koyanagi, MD Inpatient       Prognosis:  < 2 weeks  Discharge Planning: Hospice facility  Care plan was discussed with 2 daughters, sister, Dr Sloan Leiter and other members of the IDT.   Thank you for allowing the Palliative Medicine Team to assist in the care of this patient.  Mod MDM     Greater than 50%  of this time was spent counseling and coordinating care related to the above assessment and plan.  Loistine Chance, MD  Please contact Palliative Medicine Team phone at [phone] for questions and concerns.

## 2021-12-15 NOTE — Plan of Care (Signed)
  Problem: Education: Goal: Knowledge of General Education information will improve Description Including pain rating scale, medication(s)/side effects and non-pharmacologic comfort measures Outcome: Progressing   Problem: Health Behavior/Discharge Planning: Goal: Ability to manage health-related needs will improve Outcome: Progressing   

## 2021-12-15 NOTE — Progress Notes (Signed)
PROGRESS NOTE    Willie Parker  HMC:[id number] DOB: 1955-04-27 DOA: 12/13/2021 PCP: Egbert Garibaldi, PA-C    Brief Narrative:  66 year old gentleman with recently diagnosed malignant gastric cancer with liver and bone mets, alcohol and polysubstance abuse, poor health maintenance who was in the hospital and discharged 2 days ago presents back with ongoing abdominal pain nausea and not tolerating diet.  In the emergency room calcium 14.2 with albumin of 3.1, mildly elevated transaminases.  Admitted with hypercalcemia, nausea vomiting. Patient remains very debilitated, not a candidate for chemotherapy or further treatment.   Assessment & Plan:   Hypercalcemia of malignancy, corrected calcium more than 15 Gastric adenocarcinoma with metastasis to liver and bones, recently diagnosed. Intractable nausea vomiting and abdominal pain secondary to metastatic disease. Failure to thrive, frailty and debility. Severe protein calorie malnutrition.  Continue IV fluids with normal saline, calcitonin today.  Adequate pain medications. Seen by oncology, he is not a chemotherapy candidate at this situation. Nearing end-of-life.  See goal of care discussion below.  Goal of care discussion: Multiple goal of care discussions were held at the bedside and with family.  Patient has designated and verbalized to his that his daughter [name] is to act as healthcare agent for him. Today after multiple family meetings, given patient's severe pain and suffering and failure to thrive decision was made to treat him with hospice level of care. Continue current level of care today, continue IV fluids.  Continue Protonix today. No escalation of care.  DNR/DNI.   DVT prophylaxis: SCDs Start: 12/13/21 2222   Code Status: DNR/DNI. Family Communication: 2 daughters at the bedside. Disposition Plan: Status is: Inpatient Remains inpatient appropriate because: Severe systemic disease, now needing hospice.      Consultants:  Oncology Palliative  Procedures:  None  Antimicrobials:  None   Subjective: Examined patient multiple times.  He is mostly sleepy.  He tells me his belly is hurting and it is about 8 out of 10.  He shows me that he only drank 2 sips of water today.  He tells me that he talked to cancer doctor and he has a stage IV disease and is not treatable. See goal of care discussion above with multiple family members.  Objective: Vitals:   12/14/21 1020 12/14/21 1628 12/14/21 2218 12/15/21 0501  BP: 101/65 (!) 108/54 (!) 102/56 101/62  Pulse: 79 60 90 96  Resp: '16 12 19 16  '$ Temp: 98.3 F (36.8 C) 98 F (36.7 C) 98 F (36.7 C) 98.3 F (36.8 C)  TempSrc: Oral Oral  Oral  SpO2: 100%  99% 99%  Weight:      Height:        Intake/Output Summary (Last 24 hours) at 12/15/2021 1322 Last data filed at 12/15/2021 0900 Gross per 24 hour  Intake 2962.65 ml  Output 3750 ml  Net -787.35 ml    Filed Weights   12/13/21 1942  Weight: 61.2 kg    Examination:  General exam: Appears frail and debilitated.  Flat affect.  Sick looking. Respiratory system: No added sounds. Cardiovascular system: S1 & S2 heard, irregularly irregular.   Gastrointestinal system: Abdomen is nondistended, soft and generalized tenderness mostly on epigastrium. Palpable liver.   Central nervous system: Alert and awake.  Tired and lethargic.  Sick looking. Extremities: Moves all extremities.     Data Reviewed: I have personally reviewed following labs and imaging studies  CBC: Recent Labs  Lab 12/09/21 0352 12/09/21 1719 12/10/21 0419 12/11/21 0353 12/13/21 1933 12/14/21  0342  WBC 8.4  --  8.5 9.3 12.5* 10.8*  NEUTROABS  --   --  5.2 6.9 9.0*  --   HGB 8.9* 9.2* 9.2* 9.8* 10.5* 8.9*  HCT 26.4* 27.5* 27.3* 28.6* 31.1* 26.8*  MCV 87.4  --  87.8 86.7 86.4 88.7  PLT 177  --  195 200 212 789    Basic Metabolic Panel: Recent Labs  Lab 12/09/21 0352 12/10/21 0419 12/11/21 0353  12/12/21 0406 12/13/21 1933 12/14/21 0342 12/15/21 0410  NA 133*   < > 135 133* 134* 137 137  K 4.1   < > 4.1 3.8 4.5 3.8 3.8  CL 100   < > 100 101 99 106 104  CO2 26   < > '24 25 28 25 22  '$ GLUCOSE 100*   < > 118* 87 91 81 61*  BUN 6*   < > '8 13 16 18 21  '$ CREATININE 0.81   < > 0.79 1.01 0.88 0.90 1.06  CALCIUM 11.0*   < > 12.0* 12.5* 14.2* 13.8* 13.0*  MG 1.4*  --  1.3* 1.7  --  1.4* 1.6*  PHOS  --   --  3.9  --   --   --  2.9   < > = values in this interval not displayed.    GFR: Estimated Creatinine Clearance: 60.9 mL/min (by C-G formula based on SCr of 1.06 mg/dL). Liver Function Tests: Recent Labs  Lab 12/11/21 0353 12/12/21 0406 12/13/21 1933 12/14/21 0342 12/15/21 0410  AST 230* 257* 391* 369* 520*  ALT 137* 160* 172* 170* 242*  ALKPHOS 233* 249* 277* 247* 292*  BILITOT 0.4 0.6 1.0 0.7 1.0  PROT 6.1* 6.1* 6.5 5.6* 5.4*  ALBUMIN 2.7* 3.0* 3.1* 2.7* 2.5*    Recent Labs  Lab 12/13/21 1933  LIPASE 26    No results for input(s): "AMMONIA" in the last 168 hours. Coagulation Profile: No results for input(s): "INR", "PROTIME" in the last 168 hours. Cardiac Enzymes: No results for input(s): "CKTOTAL", "CKMB", "CKMBINDEX", "TROPONINI" in the last 168 hours. BNP (last 3 results) No results for input(s): "PROBNP" in the last 8760 hours. HbA1C: No results for input(s): "HGBA1C" in the last 72 hours. CBG: No results for input(s): "GLUCAP" in the last 168 hours. Lipid Profile: No results for input(s): "CHOL", "HDL", "LDLCALC", "TRIG", "CHOLHDL", "LDLDIRECT" in the last 72 hours. Thyroid Function Tests: No results for input(s): "TSH", "T4TOTAL", "FREET4", "T3FREE", "THYROIDAB" in the last 72 hours. Anemia Panel: Recent Labs    12/15/21 0822  FERRITIN 686*  TIBC 268  IRON 37*   Sepsis Labs: Recent Labs  Lab 12/13/21 1933 12/13/21 2133  LATICACIDVEN 2.2* 1.8     No results found for this or any previous visit (from the past 240 hour(s)).        Radiology Studies: DG Chest Port 1 View  Result Date: 12/13/2021 CLINICAL DATA:  Upper abdominal pain EXAM: PORTABLE CHEST 1 VIEW COMPARISON:  12/05/2021 FINDINGS: Heart and mediastinal contours are within normal limits. No focal opacities or effusions. No acute bony abnormality. IMPRESSION: No active disease. Electronically Signed   By: Rolm Baptise M.D.   On: 12/13/2021 20:14        Scheduled Meds:  folic acid  1 mg Oral Daily   multivitamin with minerals  1 tablet Oral Daily   pantoprazole (PROTONIX) IV  40 mg Intravenous Q12H   thiamine  100 mg Oral Daily   Or   thiamine  100 mg Intravenous  Daily   Continuous Infusions:  sodium chloride 150 mL/hr at 12/15/21 0400     LOS: 1 day    Time spent: 35 minutes    Barb Merino, MD [facility] Pager [phone]

## 2021-12-15 NOTE — Plan of Care (Signed)
?  Problem: Coping: ?Goal: Level of anxiety will decrease ?Outcome: Progressing ?  ?Problem: Safety: ?Goal: Ability to remain free from injury will improve ?Outcome: Progressing ?  ?

## 2021-12-15 NOTE — Progress Notes (Signed)
Per Dr Marin Olp, request for [facility] testing sent on specimen [id number] DOS 12/08/2021.  Patient is currently admitted to the hospital. Will continue to follow for post discharge needs and office follow up.   Oncology Nurse Navigator Documentation     12/15/2021    8:15 AM  Oncology Nurse Navigator Flowsheets  Abnormal Finding Date 12/05/2021  Confirmed Diagnosis Date 12/08/2021  Diagnosis Status Pending Molecular Studies  Navigator Follow Up Date: 12/21/2021  Navigator Follow Up Reason: Appointment Review  Navigator Location [facility]  Navigator Encounter Type Molecular Studies  Patient Visit Type MedOnc  Treatment Phase Pre-Tx/Tx Discussion  Barriers/Navigation Needs Coordination of Care  Interventions Coordination of Care  Acuity Level 2-Minimal Needs (1-2 Barriers Identified)  Coordination of Care Pathology  Support Groups/Services Friends and Family  Time Spent with Patient 30

## 2021-12-15 NOTE — Consult Note (Signed)
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  HPI: Willie Parker is a very nice 66 year old African-American male.  At first, I thought he would be the center but unfortunately, he was not.  He does seem to be very nice.  His daughter-Cheri -was on the cell phone.  He had been hospitalized about a week ago.  He came in with abdominal pain, nausea/vomiting and hematemesis.  He had about a 40 pound weight loss.  In the ER, he had elevated LFTs.  He had a CT of the abdomen pelvis which showed numerous liver lesions and lytic bone lesions.  He is subsequently underwent an upper endoscopy on 12/08/2021.  This showed that he had a ulcer in the gastric fundus.  This measured 2.3 cm.  Biopsies were taken.  The pathology report ([id number]) showed a moderately to poorly differentiated adenocarcinoma.  He did not have any lung metastasis on CT scan.  He unfortunately has been using recreational drugs.  He has been doing cocaine.  He does have alcohol use.  He was supposed to see [place] as an outpatient.  However, he was readmitted on 12/13/2021 due to abdominal pain and vomiting and not eating.  When he came in, his calcium was 14.2.  He did have some elevated LFTs.  He has been given some IV fluids.  I think the problem that we are going to have is his overall performance status is not good at all.  He seems to be quite weak.  The weight loss is certainly troublesome.  He would have his prealbumin checked.  We will have to send the pathology off for molecular analysis.  This may help.  Again is not eating much.  I do not think he is obstructed.  He has had no diarrhea.  There may be a little bit of constipation.  He does smoke.  Again does have alcohol use.  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  He says mom had cancer.  He is not sure count of  cancer she had.  Overall, I would have said his performance status is probably no more than ECOG 2-3.    Past Medical History:  Diagnosis Date   Arthritis    Knee pain    Peptic ulcer   :   Past Surgical History:  Procedure Laterality Date   BIOPSY  12/08/2021   Procedure: BIOPSY;  Surgeon: Loney Laurence, DO;  Location: [facility];  Service: Gastroenterology;;   COLONOSCOPY Left 12/08/2021   Procedure: COLONOSCOPY;  Surgeon: Loney Laurence, DO;  Location: [facility];  Service: Gastroenterology;  Laterality: Left;   ESOPHAGOGASTRODUODENOSCOPY (EGD) WITH PROPOFOL Left 12/08/2021   Procedure: ESOPHAGOGASTRODUODENOSCOPY (EGD) WITH PROPOFOL;  Surgeon: Loney Laurence, DO;  Location: [facility];  Service: Gastroenterology;  Laterality: Left;   HOT HEMOSTASIS  12/08/2021   Procedure: HOT HEMOSTASIS (ARGON PLASMA COAGULATION/BICAP);  Surgeon: Loney Laurence, DO;  Location: [facility];  Service: Gastroenterology;;   MANDIBLE FRACTURE SURGERY     when he was 66 year old   POLYPECTOMY  12/08/2021   Procedure: POLYPECTOMY;  Surgeon: Loney Laurence, DO;  Location: [facility];  Service: Gastroenterology;;   TOTAL KNEE ARTHROPLASTY Right 12/19/2017   Procedure: RIGHT TOTAL KNEE ARTHROPLASTY;  Surgeon: Leandrew Koyanagi, MD;  Location: [facility];  Service: Orthopedics;  Laterality: Right;  :  Current Facility-Administered Medications:    0.9 %  sodium chloride infusion, , Intravenous, Continuous, Ghimire, Dante Gang, MD, Last Rate: 150 mL/hr at 12/15/21 0400, Infusion Verify at 12/15/21 0400   calcitonin (MIACALCIN) injection 368 Units, 6 Units/kg, Subcutaneous, Daily, Volanda Napoleon, MD, 368 Units at 69/67/89 3810   folic acid (FOLVITE) tablet 1 mg, 1 mg, Oral, Daily, Shela Leff, MD, 1 mg at 12/14/21 0920   HYDROmorphone (DILAUDID) injection 1 mg, 1 mg, Intravenous, Q4H PRN, Shela Leff, MD, 1 mg at 12/15/21 0003   magnesium sulfate IVPB 2 g 50 mL, 2 g,  Intravenous, Once, Kathryne Eriksson, NP, Last Rate: 50 mL/hr at 12/15/21 0642, 2 g at 12/15/21 1751   multivitamin with minerals tablet 1 tablet, 1 tablet, Oral, Daily, Shela Leff, MD, 1 tablet at 12/14/21 0920   ondansetron (ZOFRAN) injection 4 mg, 4 mg, Intravenous, Q6H PRN, Shela Leff, MD   pantoprazole (PROTONIX) injection 40 mg, 40 mg, Intravenous, Q12H, Shela Leff, MD, 40 mg at 12/14/21 2138   thiamine (VITAMIN B1) tablet 100 mg, 100 mg, Oral, Daily, 100 mg at 12/14/21 0920 **OR** thiamine (VITAMIN B1) injection 100 mg, 100 mg, Intravenous, Daily, Shela Leff, MD:   calcitonin  6 Units/kg Subcutaneous Daily   folic acid  1 mg Oral Daily   multivitamin with minerals  1 tablet Oral Daily   pantoprazole (PROTONIX) IV  40 mg Intravenous Q12H   thiamine  100 mg Oral Daily   Or   thiamine  100 mg Intravenous Daily  :  No Known Allergies:   Family History  Problem Relation Age of Onset   Diabetes Mother    Hypertension Mother    Hypertension Sister    Diabetes Sister   :   Social History   Socioeconomic History   Marital status: Divorced    Spouse name: Not on file   Number of children: Not on file   Years of education: Not on file   Highest education level: Not on file  Occupational History   Not on file  Tobacco Use   Smoking status: Every Day    Packs/day: 0.25    Types: Cigarettes   Smokeless tobacco: Never  Vaping Use   Vaping Use: Never used  Substance and Sexual Activity   Alcohol use: Yes   Drug use: Yes    Types: Cocaine    Comment: last use 12/03/21   Sexual activity: Not Currently  Other Topics Concern   Not on file  Social History Narrative   Not on file   Social Determinants of Health   Financial Resource Strain: Not on file  Food Insecurity: No Food Insecurity (12/14/2021)   Hunger Vital Sign    Worried About Running Out of Food in the Last Year: Never true    Ran Out of Food in the Last Year: Never true   Transportation Needs: No Transportation Needs (12/14/2021)   PRAPARE - Hydrologist (Medical): No    Lack of Transportation (Non-Medical): No  Physical Activity: Not on file  Stress: Not on file  Social Connections: Not on file  Intimate Partner Violence: Not At Risk (12/14/2021)   Humiliation, Afraid, Rape, and Kick questionnaire    Fear of Current or Ex-Partner: No    Emotionally Abused: No    Physically Abused: No    Sexually Abused: No  :  Review of Systems  Constitutional:  Positive for malaise/fatigue and weight loss.  HENT: Negative.  Negative for  hearing loss.   Eyes: Negative.   Respiratory: Negative.    Cardiovascular: Negative.   Gastrointestinal:  Positive for abdominal pain, constipation, nausea and vomiting.  Genitourinary: Negative.   Musculoskeletal:  Positive for back pain, joint pain and myalgias.  Skin: Negative.   Neurological:  Positive for weakness.  Endo/Heme/Allergies: Negative.   Psychiatric/Behavioral: Negative.       Exam: Patient Vitals for the past 24 hrs:  BP Temp Temp src Pulse Resp SpO2  12/15/21 0501 101/62 98.3 F (36.8 C) Oral 96 16 99 %  12/14/21 2218 (!) 102/56 98 F (36.7 C) -- 90 19 99 %  12/14/21 1628 (!) 108/54 98 F (36.7 C) Oral 60 12 --  12/14/21 1020 101/65 98.3 F (36.8 C) Oral 79 16 100 %   Physical Exam Vitals reviewed.  Constitutional:      Comments: Overall, this is a thin African-American male.  He has some temporal muscle wasting.  He does have some muscle atrophy symmetrically in upper and lower extremities.  He is quite fatigued.  HENT:     Head: Normocephalic and atraumatic.  Eyes:     Pupils: Pupils are equal, round, and reactive to light.  Cardiovascular:     Rate and Rhythm: Normal rate and regular rhythm.     Heart sounds: Normal heart sounds.  Pulmonary:     Effort: Pulmonary effort is normal.     Breath sounds: Normal breath sounds.  Abdominal:     General: Bowel sounds  are normal.     Palpations: Abdomen is soft.     Comments: Abdominal exam is soft.  Bowel sounds are decreased.  He has no obvious fluid wave.  There is no tenderness to palpation in the epigastric area.  He has no obvious abdominal mass.  His liver edge might be at the right costal margin.  Musculoskeletal:        General: No tenderness or deformity. Normal range of motion.     Cervical back: Normal range of motion.  Lymphadenopathy:     Cervical: No cervical adenopathy.  Skin:    General: Skin is warm and dry.     Findings: No erythema or rash.  Neurological:     Mental Status: He is alert and oriented to person, place, and time.  Psychiatric:        Behavior: Behavior normal.        Thought Content: Thought content normal.        Judgment: Judgment normal.     Recent Labs    12/13/21 1933 12/14/21 0342  WBC 12.5* 10.8*  HGB 10.5* 8.9*  HCT 31.1* 26.8*  PLT 212 190    Recent Labs    12/14/21 0342 12/15/21 0410  NA 137 137  K 3.8 3.8  CL 106 104  CO2 25 22  GLUCOSE 81 61*  BUN 18 21  CREATININE 0.90 1.06  CALCIUM 13.8* 13.0*    Blood smear review: None  Pathology: See above    Assessment and Plan: Mr. Goldston is a very nice 66 year old African-American male.  He has metastatic adenocarcinoma of the stomach.  I think the problem that were going to have is his poor performance status.  I would be surprised regarding be able to treat him.  I told his daughter that we have a situation that we can certainly try to treat but we cannot cure.  Our primary goal is his quality of life.  She agrees with this.  Currently, I just  am not convinced that we will Be able to treat him.  I know that Palliative Care has seen him already.  His calcium is improving.  I ordered some calcitonin for him.  His labs shows calcium be 13 but is albumin is only 2.5.  Hopefully the calcitonin will continue to help.  His LFTs are elevated.  I am sure this is from his underlying  malignancy.  The fact that he is still doing cocaine is certainly a problem also.  I think a prealbumin will be incredibly important for [place].  We will have to see what his iron studies also show.  Again, we might be looking at [facility] for him.  I think a prealbumin will certainly help [place].  Again, quality of life is good be a primary goal.  We really need to make sure this is a priority for Mr. Maldonado.  He does seem quite nice.  He does seem to have a horrible disease.  His status is not that great.  I will still send off the tumor for molecular analysis so that we can see if there is any possible targeted agents that we might be able to use could not be chemotherapy that he would tolerate.  His daughter is quite involved.  She totally agrees that we need to make sure his quality of life is the priority.  We will follow along.  For right now, I just do not think that we will going to be able to treat him.  The fact that his LFTs are quite elevated is also a problem.  Willie Haw, MD  Jeneen Rinks 1:5

## 2021-12-15 NOTE — Progress Notes (Signed)
Per Dr Marin Olp, patient has chosen to proceed with Hospice. Called account specialist, Cindy at [facility] and cancelled test request that was made earlier today.   Oncology Nurse Navigator Documentation     12/15/2021    2:00 PM  Oncology Nurse Navigator Flowsheets  Navigation Complete Date: 12/15/2021  Post Navigation: Continue to Follow Patient? No  Reason Not Navigating Patient: Airline pilot Encounter Type Molecular Studies  Patient Visit Type MedOnc  Treatment Phase Pre-Tx/Tx Discussion  Barriers/Navigation Needs Coordination of Care  Interventions Coordination of Care  Acuity Level 2-Minimal Needs (1-2 Barriers Identified)  Coordination of Care Pathology  Support Groups/Services Friends and Family  Time Spent with Patient 15

## 2021-12-16 ENCOUNTER — Inpatient Hospital Stay: Payer: Medicare Other | Admitting: Hematology & Oncology

## 2021-12-16 ENCOUNTER — Inpatient Hospital Stay: Payer: Medicare Other

## 2021-12-16 LAB — CBC WITH DIFFERENTIAL/PLATELET
Abs Immature Granulocytes: 0.31 10*3/uL — ABNORMAL HIGH (ref 0.00–0.07)
Basophils Absolute: 0 10*3/uL (ref 0.0–0.1)
Basophils Relative: 0 %
Eosinophils Absolute: 0 10*3/uL (ref 0.0–0.5)
Eosinophils Relative: 0 %
HCT: 26.9 % — ABNORMAL LOW (ref 39.0–52.0)
Hemoglobin: 8.7 g/dL — ABNORMAL LOW (ref 13.0–17.0)
Immature Granulocytes: 3 %
Lymphocytes Relative: 16 %
Lymphs Abs: 1.9 10*3/uL (ref 0.7–4.0)
MCH: 28.9 pg (ref 26.0–34.0)
MCHC: 32.3 g/dL (ref 30.0–36.0)
MCV: 89.4 fL (ref 80.0–100.0)
Monocytes Absolute: 0.7 10*3/uL (ref 0.1–1.0)
Monocytes Relative: 7 %
Neutro Abs: 8.4 10*3/uL — ABNORMAL HIGH (ref 1.7–7.7)
Neutrophils Relative %: 74 %
Platelets: 181 10*3/uL (ref 150–400)
RBC: 3.01 MIL/uL — ABNORMAL LOW (ref 4.22–5.81)
RDW: 14.2 % (ref 11.5–15.5)
WBC: 11.4 10*3/uL — ABNORMAL HIGH (ref 4.0–10.5)
nRBC: 0.4 % — ABNORMAL HIGH (ref 0.0–0.2)

## 2021-12-16 LAB — COMPREHENSIVE METABOLIC PANEL
ALT: 291 U/L — ABNORMAL HIGH (ref 0–44)
AST: 707 U/L — ABNORMAL HIGH (ref 15–41)
Albumin: 2.6 g/dL — ABNORMAL LOW (ref 3.5–5.0)
Alkaline Phosphatase: 352 U/L — ABNORMAL HIGH (ref 38–126)
Anion gap: 10 (ref 5–15)
BUN: 23 mg/dL (ref 8–23)
CO2: 21 mmol/L — ABNORMAL LOW (ref 22–32)
Calcium: 12.8 mg/dL — ABNORMAL HIGH (ref 8.9–10.3)
Chloride: 105 mmol/L (ref 98–111)
Creatinine, Ser: 1.08 mg/dL (ref 0.61–1.24)
GFR, Estimated: >60 mL/min (ref >60)
Glucose, Bld: 65 mg/dL — ABNORMAL LOW (ref 70–99)
Potassium: 3.8 mmol/L (ref 3.5–5.1)
Sodium: 136 mmol/L (ref 135–145)
Total Bilirubin: 1.1 mg/dL (ref 0.3–1.2)
Total Protein: 5.6 g/dL — ABNORMAL LOW (ref 6.5–8.1)

## 2021-12-16 MED ORDER — GLYCOPYRROLATE 0.2 MG/ML IJ SOLN: 0.2000 mg | INTRAMUSCULAR | Status: DC | PRN

## 2021-12-16 MED ORDER — HALOPERIDOL LACTATE 5 MG/ML IJ SOLN: 0.5000 mg | INTRAMUSCULAR | Status: DC | PRN

## 2021-12-16 MED ORDER — SODIUM CHLORIDE 0.9 % IV SOLN: 250.0000 mL | INTRAVENOUS | Status: DC | PRN

## 2021-12-16 MED ORDER — LORAZEPAM 2 MG/ML IJ SOLN: 1.0000 mg | INTRAMUSCULAR | Status: DC | PRN

## 2021-12-16 MED ORDER — SODIUM CHLORIDE 0.9 % IV SOLN
510.0000 mg | Freq: Once | INTRAVENOUS | Status: AC
  Administered 2021-12-16: 510 mg via INTRAVENOUS
  Filled 2021-12-16: qty 17

## 2021-12-16 MED ORDER — LORAZEPAM 2 MG/ML PO CONC
1.0000 mg | ORAL | Status: DC | PRN
  Filled 2021-12-16: qty 0.5

## 2021-12-16 MED ORDER — HALOPERIDOL LACTATE 2 MG/ML PO CONC: 0.5000 mg | ORAL | Status: DC | PRN

## 2021-12-16 MED ORDER — SODIUM CHLORIDE 0.9% FLUSH
3.0000 mL | Freq: Two times a day (BID) | INTRAVENOUS | Status: DC
  Administered 2021-12-16 – 2021-12-17 (×2): 3 mL via INTRAVENOUS

## 2021-12-16 MED ORDER — SODIUM CHLORIDE 0.9% FLUSH: 3.0000 mL | INTRAVENOUS | Status: DC | PRN

## 2021-12-16 MED ORDER — ACETAMINOPHEN 650 MG RE SUPP: 650.0000 mg | Freq: Four times a day (QID) | RECTAL | Status: DC | PRN

## 2021-12-16 MED ORDER — GLYCOPYRROLATE 1 MG PO TABS: 1.0000 mg | ORAL_TABLET | ORAL | Status: DC | PRN

## 2021-12-16 MED ORDER — ACETAMINOPHEN 325 MG PO TABS: 650.0000 mg | ORAL_TABLET | Freq: Four times a day (QID) | ORAL | Status: DC | PRN

## 2021-12-16 MED ORDER — LORAZEPAM 1 MG PO TABS: 1.0000 mg | ORAL_TABLET | ORAL | Status: DC | PRN

## 2021-12-16 MED ORDER — HALOPERIDOL 0.5 MG PO TABS: 0.5000 mg | ORAL_TABLET | ORAL | Status: DC | PRN

## 2021-12-16 NOTE — Progress Notes (Signed)
Daily Progress Note   Patient Name: Willie Parker       Date: 12/16/2021 DOB: 09/26/1955  Age: 66 y.o. MRN#: [id number] Attending Physician: Barb Merino, MD Primary Care Physician: Egbert Garibaldi, PA-C Admit Date: 12/13/2021  Reason for Consultation/Follow-up: Establishing goals of care  Subjective: Largely unresponsive, occasional restless movements noted by family members  Length of Stay: 2  Current Medications: Scheduled Meds:   sodium chloride flush  3 mL Intravenous Q12H   traMADol  100 mg Oral Q6H    Continuous Infusions:  sodium chloride      PRN Meds: sodium chloride, acetaminophen **OR** acetaminophen, glycopyrrolate **OR** glycopyrrolate **OR** glycopyrrolate, haloperidol **OR** haloperidol **OR** haloperidol lactate, HYDROmorphone (DILAUDID) injection, LORazepam **OR** LORazepam **OR** LORazepam, ondansetron (ZOFRAN) IV, sodium chloride flush  Physical Exam         Frail cachectic Resting in bed No edema Abdomen not distended  Vital Signs: BP (!) 96/52 (BP Location: Right Arm)   Pulse 63   Temp 98.4 F (36.9 C) (Oral)   Resp 10   Ht 6' (1.829 m)   Wt 61.2 kg   SpO2 95%   BMI 18.31 kg/m  SpO2: SpO2: 95 % O2 Device: O2 Device: Room Air O2 Flow Rate:    Intake/output summary:  Intake/Output Summary (Last 24 hours) at 12/16/2021 1547 Last data filed at 12/16/2021 1415 Gross per 24 hour  Intake 5549.15 ml  Output 475 ml  Net 5074.15 ml    LBM: Last BM Date : 12/11/21 Baseline Weight: Weight: 61.2 kg Most recent weight: Weight: 61.2 kg       Palliative Assessment/Data:      Patient Active Problem List   Diagnosis Date Noted   Hypercalcemia of malignancy 12/14/2021   Palliative care encounter 12/14/2021   End of life care 12/14/2021    Dehydration 12/14/2021   Leukocytosis 12/13/2021   Lactic acidosis 12/13/2021   Alcohol use disorder 12/13/2021   Metastasis from gastric cancer ([place]) 12/10/2021   Malnutrition of moderate degree 12/08/2021   Hematemesis 12/05/2021   Unintentional weight loss 12/05/2021   Acute blood loss anemia 12/05/2021   Abnormal LFTs 12/05/2021   Hypercalcemia 12/05/2021   Hyponatremia 12/05/2021   Tobacco use 12/05/2021   Alcohol abuse 12/05/2021   Cocaine abuse ([place]) 12/05/2021   Primary osteoarthritis of right knee 12/19/2017  S/P total knee arthroplasty, right 12/19/2017    Palliative Care Assessment & Plan   Patient Profile:    Assessment:  PPS 20% Patient is a 66 year old gentleman with a past medical history of OA, PUD, and polysubstance abuse (tobacco, alcohol, cocaine with most recent UDS on 10/7 positive for opiates and cocaine) and recent 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  Patient had recently been hospitalized from 10/7 to 10/14 where diagnosis of gastric adenocarcinoma was made.  Patient had been referred to oncology and outpatient palliative care at time of discharge though unfortunately was not out of the hospital long enough to make these appointments.  Since being admitted patient has received aggressive therapies for management of his hypercalcemia in the setting of his malignancy.  Palliative care consulted to assist with complex medical decision-making.  Recommendations/Plan:   DNR DNI Comfort care Residential hospice End-of-life signs and symptoms discussed with the sister and daughter present at the bedside, agree with comfort care, await hospice bed availability.  Goals of Care and Additional Recommendations: Limitations on Scope of Treatment: Full Comfort Care  Code Status:    Code Status Orders  (From  admission, onward)           Start     Ordered   12/15/21 1205  Do not attempt resuscitation (DNR)  Continuous       Question Answer Comment  In the event of cardiac or respiratory ARREST Do not call a "code blue"   In the event of cardiac or respiratory ARREST Do not perform Intubation, CPR, defibrillation or ACLS   In the event of cardiac or respiratory ARREST Use medication by any route, position, wound care, and other measures to relive pain and suffering. May use oxygen, suction and manual treatment of airway obstruction as needed for comfort.      12/15/21 1204           Code Status History     Date Active Date Inactive Code Status Order ID Comments User Context   12/13/2021 2222 12/15/2021 1204 Full Code [id number]  Shela Leff, MD ED   12/05/2021 1631 12/12/2021 2212 Full Code [id number]  Reubin Milan, MD ED   12/19/2017 1508 12/21/2017 1701 Full Code [id number]  Leandrew Koyanagi, MD Inpatient       Prognosis:  < 2 weeks  Discharge Planning: Hospice facility  Care plan was discussed with daughter and sister.  Thank you for allowing the Palliative Medicine Team to assist in the care of this patient.  Mod MDM     Greater than 50%  of this time was spent counseling and coordinating care related to the above assessment and plan.  Loistine Chance, MD  Please contact Palliative Medicine Team phone at [phone] for questions and concerns.

## 2021-12-16 NOTE — Plan of Care (Signed)
  Problem: Clinical Measurements: Goal: Ability to maintain clinical measurements within normal limits will improve Outcome: Adequate for Discharge   

## 2021-12-16 NOTE — Progress Notes (Signed)
I received notification that Mr. Haugan and his family will be agreeable to Hospice care.  I will certainly have no problems with this.  He has metastatic gastric cancer.  He is noncurable.  I think that it be very difficult to treat him because of his overall poor performance status (ECOG 3.)  His prealbumin is only 11.  This is quite low.  I think this goes to his poor nutritional state.  Of note, his iron studies do show iron deficiency.  His ferritin is 66 with an iron saturation of only 14%.  I will go and give him a dose of Feraheme.  His hemoglobin is only 8.7 which is quite low.  His LFTs continue to go up.  Again I suspect this is all secondary to his underlying malignancy.  I just want him to have comfort, respect, and dignity.  He truly deserves this.  There is not appear to be much in the way of pain right now.  I do not think is low eating that much, which is confirmed by the low prealbumin.  He has had no fever.  He has had no cough.  I would have to think that his prognosis is good to be quite limited.  I would be surprised if he made it more than 2-3 weeks.  I think he can go into Hospice home, I think this would certainly be reasonable and probably best for him and his family.  His hypercalcemia is improving.  However, I am sure this will go up.  I will give a dose of Zometa.  Maybe, this will help keep the calcium down a little bit longer.  Again, he is just quite debilitated.  This is one of the heart parts with cancer.  Is hard to say long he has had his cancer.  Again, I certainly had no problems with him going to Hospice home.  I think this is very reasonable.  CODE STATUS is DO NOT RESUSCITATE.  I also agree with this.  I think this is very reasonable.  It is hard to say how much he really comprehends of what I say to him.  He does tend to fall asleep quite quickly.  I would have to think that it might be wise to be careful with any kind of withdrawal since he does  do recreational drugs.  I am not sure when he will have a bed available for him at Hospice home.  I know that he has had incredible care from everybody on [address].  Lattie Haw, MD  2 [address] [place] 4:6-8

## 2021-12-16 NOTE — Progress Notes (Signed)
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  In the emergency room calcium 14.2 with albumin of 3.1, mildly elevated transaminases.  Admitted with hypercalcemia, nausea vomiting. Patient remains very debilitated, not a candidate for chemotherapy or further treatment. Comfort care and hospice care is started on 10/18, referred to inpatient hospice.   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 Intractable nausea vomiting and abdominal pain secondary to metastatic disease. Failure to thrive, frailty and debility. Severe protein calorie malnutrition.  Plan: Patient reaching end-of-life with not eating, frequent need of pain medications, frequently vomiting. Started on comfort care and hospice level of care. Provide all comfort care medications including Tramadol around-the-clock if he can take it by mouth. Dilaudid as needed, keep IV line. Ativan for anxiety and restlessness. Manage secretions. Allow regular diet with aspiration precautions when he is awake and under supervision. RN to pronounce death if happens in the hospital. No IV fluids.  No injectable medications except for comfort care.  No labs drawn. Refer to inpatient hospice at [facility] for end-of-life care.  Transfer when bed is available.  DVT prophylaxis:   Comfort care   Code Status: DNR/DNI.  Comfort care Family Communication: Daughter Cheri at the bedside. Disposition Plan: Status is: Inpatient Remains inpatient appropriate because: End-of-life.  Waiting for hospice.      Consultants:  Oncology Palliative  Procedures:  None  Antimicrobials:  None   Subjective:  Patient seen and examined.  In the morning rounds he had just received a dose of Dilaudid and was resting.  He tells me that he has not eaten for long time.  He tells me that he has no appetite.  Belly hurts all the time.  He was very tired and lethargic. Met again with patient with daughter at the bedside, patient was unable to keep his eyes open, trying to scratch his face and was very debilitated.  Objective: Vitals:   12/15/21 2146 12/16/21 0429 12/16/21 0859 12/16/21 0915  BP: (!) 102/58 (!) 131/118 (!) 98/59 (!) 96/52  Pulse: 70 73 62 63  Resp: 20 20 12 10   Temp: (!) 97.4 F (36.3 C) 97.8 F (36.6 C) 97.7 F (36.5 C) 98.4 F (36.9 C)  TempSrc: Oral Oral Oral Oral  SpO2: 98% 100% 96% 95%  Weight:      Height:        Intake/Output Summary (Last 24 hours) at 12/16/2021 1319 Last data filed at 12/16/2021 1140 Gross per 24 hour  Intake 5364.88 ml  Output 725 ml  Net 4639.88 ml    Filed Weights   12/13/21 1942  Weight: 61.2 kg    Examination:  General exam: Appears frail and debilitated.  Sleepy and lethargic. Respiratory system: No added sounds. Cardiovascular system: S1 & S2 heard, irregularly irregular.   Gastrointestinal system: Abdomen is nondistended, soft and generalized tenderness mostly on epigastrium. Palpable liver.   Central nervous system: Alert and awake.  Tired and lethargic.  Sick looking. Extremities: Moves all extremities.     Data Reviewed: I have personally reviewed following labs and imaging studies  CBC: Recent Labs  Lab  12/10/21 0419 12/11/21 0353 12/13/21 1933 12/14/21 0342 12/16/21 0424  WBC 8.5 9.3 12.5* 10.8* 11.4*  NEUTROABS 5.2 6.9 9.0*  --  8.4*  HGB 9.2* 9.8* 10.5* 8.9* 8.7*  HCT 27.3* 28.6* 31.1* 26.8* 26.9*  MCV 87.8 86.7 86.4 88.7 89.4  PLT 195 200 212 190 979    Basic Metabolic Panel: Recent Labs  Lab  12/11/21 0353 12/12/21 0406 12/13/21 1933 12/14/21 0342 12/15/21 0410 12/16/21 0424  NA 135 133* 134* 137 137 136  K 4.1 3.8 4.5 3.8 3.8 3.8  CL 100 101 99 106 104 105  CO2 24 25 28 25 22  21*  GLUCOSE 118* 87 91 81 61* 65*  BUN 8 13 16 18 21 23   CREATININE 0.79 1.01 0.88 0.90 1.06 1.08  CALCIUM 12.0* 12.5* 14.2* 13.8* 13.0* 12.8*  MG 1.3* 1.7  --  1.4* 1.6*  --   PHOS 3.9  --   --   --  2.9  --     GFR: Estimated Creatinine Clearance: 59.8 mL/min (by C-G formula based on SCr of 1.08 mg/dL). Liver Function Tests: Recent Labs  Lab 12/12/21 0406 12/13/21 1933 12/14/21 0342 12/15/21 0410 12/16/21 0424  AST 257* 391* 369* 520* 707*  ALT 160* 172* 170* 242* 291*  ALKPHOS 249* 277* 247* 292* 352*  BILITOT 0.6 1.0 0.7 1.0 1.1  PROT 6.1* 6.5 5.6* 5.4* 5.6*  ALBUMIN 3.0* 3.1* 2.7* 2.5* 2.6*    Recent Labs  Lab 12/13/21 1933  LIPASE 26    No results for input(s): "AMMONIA" in the last 168 hours. Coagulation Profile: No results for input(s): "INR", "PROTIME" in the last 168 hours. Cardiac Enzymes: No results for input(s): "CKTOTAL", "CKMB", "CKMBINDEX", "TROPONINI" in the last 168 hours. BNP (last 3 results) No results for input(s): "PROBNP" in the last 8760 hours. HbA1C: No results for input(s): "HGBA1C" in the last 72 hours. CBG: No results for input(s): "GLUCAP" in the last 168 hours. Lipid Profile: No results for input(s): "CHOL", "HDL", "LDLCALC", "TRIG", "CHOLHDL", "LDLDIRECT" in the last 72 hours. Thyroid Function Tests: No results for input(s): "TSH", "T4TOTAL", "FREET4", "T3FREE", "THYROIDAB" in the last 72 hours. Anemia Panel: Recent Labs    12/15/21 0822  FERRITIN 686*  TIBC 268  IRON 37*    Sepsis Labs: Recent Labs  Lab 12/13/21 1933 12/13/21 2133  LATICACIDVEN 2.2* 1.8     No results found for this or any previous visit (from the past 240 hour(s)).       Radiology Studies: No results found.      Scheduled Meds:  sodium  chloride flush  3 mL Intravenous Q12H   traMADol  100 mg Oral Q6H   Continuous Infusions:  sodium chloride       LOS: 2 days    Time spent: 35 minutes    Barb Merino, MD [facility] Pager [phone]

## 2021-12-16 NOTE — Progress Notes (Signed)
   12/16/21 1145  Unmeasured Output  Emesis Occurrence 1  Emesis Characteristics  Emesis Appearance Clear;Green   Pt had an episode of emesis after receiving PO Tramadol and drinking 120 ml of water. Zofran IV given per PRN orders.

## 2021-12-16 NOTE — TOC Progression Note (Addendum)
Transition of Care [facility]) - Progression Note    Patient Details  Name: Chanan Detwiler MRN: [id number] Date of Birth: [birth year]  Transition of Care [facility]) CM/SW [place], RN Phone Number: 12/16/2021, 12:18 PM  Clinical Narrative:   Received [facility] consult for residential hospice. Spoke with patient's daughter [name] to offer choice, [name] has chosen [facility].   Notified representative who will follow up with patient's daughter and notify this RNCM of outcome, awaiting response from [facility] w/ [facility].Marland Kitchen  TOC will continue to follow.  - 1:45p Spoke with Cheri with [place] and currently no beds available today, will await a call back.      Expected Discharge Plan and Services                                                 Social Determinants of Health (SDOH) Interventions    Readmission Risk Interventions    12/12/2021    2:48 PM 12/12/2021   10:31 AM 12/06/2021   12:05 PM  Readmission Risk Prevention Plan  Transportation Screening Complete  Complete  PCP or Specialist Appt within 5-7 Days   Complete  PCP or Specialist Appt within 3-5 Days Complete Complete   Home Care Screening   Complete  Medication Review (RN CM)   Complete  HRI or Home Care Consult Complete Complete   Social Work Consult for [place] Planning/Counseling Complete Complete   Palliative Care Screening Complete Complete   Medication Review Press photographer) Complete Complete

## 2021-12-16 NOTE — Progress Notes (Signed)
   This pt was referred to hospice services. I have discussed with the pt's daughter Jarold Motto the transition from hospital to comfort care at the hospice facility in [facility]. She does confirm interest in the facility. I have reviewed records and present to our MD at [facility] home in [facility] The pt has been approved for hospice services we unfortunately do not have a bed available at this time to offer due to capacity.   We will reach out as soon as we have a bed to offer and update hospital teamf or possible d/c. Webb Silversmith RN [phone]

## 2021-12-17 NOTE — Progress Notes (Signed)
Unfortunately, Willie Parker decline appears to be a little more quicker than I would have thought.  He is not eating.  He barely opens his eyes.  He does seem to be comfortable.  He does have some urinary incontinence.  I think that we are trying to find a Hospital bed for him.  If taking him into the Hospice facility at [facility] be fantastic as that is such a nice facility.  There is no obvious bleeding.  He has had no shortness of breath.  He does look fairly comfortable.  His vital signs show temperature of 98.2.  Pulse 68.  Blood pressure 104/78.  His lungs sound clear bilaterally.  Cardiac exam is slow but regular.  He has no murmurs.  Abdomen is soft.  Liver edge is at the right costal margin.  He has no fluid wave.  Bowel sounds are decreased.  Extremities show some symmetric muscle atrophy in upper and lower extremities.  Neurological exam shows no focal neurological deficits.  Willie Parker has metastatic gastric cancer.  He is not a candidate for systemic therapy.  I did state that his disease is spread so quickly.  Clearly, this is a very aggressive process.  His decline has been much more rapid than I thought.  The way things are going, I do not think he will make it through the month of October.  Again, this is all about respect, dignity and comfort.  I know that he is getting all that by the staff on 4 W.  Hopefully, a bed can be found for him so that he can go to Hospice home.  I cannot think of anything that we need to do from an oncologic point of view.  This is all comfort care at this point.  We will continue to pray for him and his family.  Lattie Haw, MD  2 Timothy 4:6-8

## 2021-12-17 NOTE — Progress Notes (Signed)
Daily Progress Note   Patient Name: Willie Parker       Date: 12/17/2021 DOB: 1955/08/23  Age: 66 y.o. MRN#: [id number] Attending Physician: Barb Merino, MD Primary Care Physician: Emelia Loron Admit Date: 12/13/2021  Reason for Consultation/Follow-up: Establishing goals of care  Subjective: Largely unresponsive, occasional restless movements noted by family members, daughter at bedside, patient with shallow respirations, low urine output.   Length of Stay: 3  Current Medications: Scheduled Meds:   sodium chloride flush  3 mL Intravenous Q12H    Continuous Infusions:  sodium chloride      PRN Meds: sodium chloride, acetaminophen **OR** acetaminophen, glycopyrrolate **OR** glycopyrrolate **OR** glycopyrrolate, haloperidol **OR** haloperidol **OR** haloperidol lactate, HYDROmorphone (DILAUDID) injection, LORazepam **OR** LORazepam **OR** LORazepam, ondansetron (ZOFRAN) IV, sodium chloride flush  Physical Exam         Frail cachectic Resting in bed No edema Abdomen not distended Has radial pulse Some coolness LE noted  Vital Signs: BP 104/78 (BP Location: Right Arm)   Pulse 77   Temp 98.2 F (36.8 C) (Oral)   Resp 20   Ht 6' (1.829 m)   Wt 61.2 kg   SpO2 94%   BMI 18.31 kg/m  SpO2: SpO2: 94 % O2 Device: O2 Device: Room Air O2 Flow Rate:    Intake/output summary:  Intake/Output Summary (Last 24 hours) at 12/17/2021 0938 Last data filed at 12/16/2021 2130 Gross per 24 hour  Intake 1198.44 ml  Output 275 ml  Net 923.44 ml    LBM: Last BM Date : 12/11/21 Baseline Weight: Weight: 61.2 kg Most recent weight: Weight: 61.2 kg       Palliative Assessment/Data:      Patient Active Problem List   Diagnosis Date Noted   Hypercalcemia of malignancy  12/14/2021   Palliative care encounter 12/14/2021   End of life care 12/14/2021   Dehydration 12/14/2021   Leukocytosis 12/13/2021   Lactic acidosis 12/13/2021   Alcohol use disorder 12/13/2021   Metastasis from gastric cancer ([place]) 12/10/2021   Malnutrition of moderate degree 12/08/2021   Hematemesis 12/05/2021   Unintentional weight loss 12/05/2021   Acute blood loss anemia 12/05/2021   Abnormal LFTs 12/05/2021   Hypercalcemia 12/05/2021   Hyponatremia 12/05/2021   Tobacco use 12/05/2021   Alcohol abuse 12/05/2021   Cocaine abuse ([place]) 12/05/2021  Primary osteoarthritis of right knee 12/19/2017   S/P total knee arthroplasty, right 12/19/2017    Palliative Care Assessment & Plan   Patient Profile:    Assessment:  PPS 20% Patient is a 66 year old gentleman with a past medical history of OA, PUD, and polysubstance abuse (tobacco, alcohol, cocaine with most recent UDS on 10/7 positive for opiates and cocaine) and recent 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  Patient had recently been hospitalized from 10/7 to 10/14 where diagnosis of gastric adenocarcinoma was made.  Patient had been referred to oncology and outpatient palliative care at time of discharge though unfortunately was not out of the hospital long enough to make these appointments.  Since being admitted patient has received aggressive therapies for management of his hypercalcemia in the setting of his malignancy.  Palliative care consulted to assist with complex medical decision-making.  Recommendations/Plan:   DNR DNI Comfort care Residential hospice End-of-life signs and symptoms discussed with daughter present at the bedside, agree with continuing comfort care, daughter received phone call from hospice liaison while I was in the room regarding the possibility of a  hospice bed being available today.     Goals of Care and Additional Recommendations: Limitations on Scope of Treatment: Full Comfort Care  Code Status:    Code Status Orders  (From admission, onward)           Start     Ordered   12/15/21 1205  Do not attempt resuscitation (DNR)  Continuous       Question Answer Comment  In the event of cardiac or respiratory ARREST Do not call a "code blue"   In the event of cardiac or respiratory ARREST Do not perform Intubation, CPR, defibrillation or ACLS   In the event of cardiac or respiratory ARREST Use medication by any route, position, wound care, and other measures to relive pain and suffering. May use oxygen, suction and manual treatment of airway obstruction as needed for comfort.      12/15/21 1204           Code Status History     Date Active Date Inactive Code Status Order ID Comments User Context   12/13/2021 2222 12/15/2021 1204 Full Code [id number]  Shela Leff, MD ED   12/05/2021 1631 12/12/2021 2212 Full Code [id number]  Reubin Milan, MD ED   12/19/2017 1508 12/21/2017 1701 Full Code [id number]  Leandrew Koyanagi, MD Inpatient       Prognosis:  < 2 weeks  Discharge Planning: Hospice facility  Care plan was discussed with daughter    Thank you for allowing the Palliative Medicine Team to assist in the care of this patient.  Mod MDM     Greater than 50%  of this time was spent counseling and coordinating care related to the above assessment and plan.  Loistine Chance, MD  Please contact Palliative Medicine Team phone at [phone] for questions and concerns.

## 2021-12-17 NOTE — TOC Transition Note (Addendum)
Transition of Care [facility]) - CM/SW Discharge Note   Patient Details  Name: Willie Parker MRN: [id number] Date of Birth: 02/10/1956  Transition of Care [facility]) CM/SW Contact:  Roseanne Kaufman, RN Phone Number: 12/17/2021, 9:46 AM   Clinical Narrative:   Spoke with Cheri with [facility] in [place], residential hospice bed is available today. Report can be called at [phone], awaiting dc summary.  TOC will continue to follow.           Patient Goals and CMS Choice        Discharge Placement                       Discharge Plan and Services                                     Social Determinants of Health (SDOH) Interventions     Readmission Risk Interventions    12/12/2021    2:48 PM 12/12/2021   10:31 AM 12/06/2021   12:05 PM  Readmission Risk Prevention Plan  Transportation Screening Complete  Complete  PCP or Specialist Appt within 5-7 Days   Complete  PCP or Specialist Appt within 3-5 Days Complete Complete   Home Care Screening   Complete  Medication Review (RN CM)   Complete  HRI or Home Care Consult Complete Complete   Social Work Consult for [place] Planning/Counseling Complete Complete   Palliative Care Screening Complete Complete   Medication Review Press photographer) Complete Complete

## 2021-12-17 NOTE — Care Management Important Message (Signed)
Important Message  Patient Details Hospice  Name: Rubel Heckard MRN: [id number] Date of Birth: 05/31/1955   Medicare Important Message Given:  No     Kerin Salen 12/17/2021, 11:05 AM

## 2021-12-17 NOTE — Discharge Summary (Signed)
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  In the emergency room calcium 14.2 with albumin of 3.1, mildly elevated transaminases.  Admitted with hypercalcemia, nausea vomiting. Patient remains very debilitated, not a candidate for chemotherapy or further treatment. Comfort care and hospice care is started on 10/18, referred to inpatient hospice. At end of life now.   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 Intractable nausea vomiting and abdominal pain secondary to metastatic disease. Failure to thrive, frailty and debility. Severe protein calorie malnutrition.   Plan: Patient reaching end-of-life with not eating, frequent need of pain medications, frequently vomiting. Started on comfort care and hospice level of care. Provide all comfort care medications including Tramadol around-the-clock was helpful when he was able to take by mouth.  Dilaudid as needed, keep IV line if hospice home needs it. Ativan for anxiety and restlessness. Manage secretions. Allow regular diet with aspiration precautions when he is awake and under supervision. RN to pronounce death if happens in the hospital. Transfer to inpatient hospice today to provide end-of-life care. Patient is not safe to take anything by  mouth at this time.  Will medicate for comfort before transfer in an ambulance.  Discharge Diagnoses:  Principal Problem:   Hypercalcemia Active Problems:   Abnormal LFTs   Metastasis from gastric cancer (HCC)   Leukocytosis   Lactic acidosis   Alcohol use disorder   Hypercalcemia of malignancy   Palliative care encounter   End of life care   Dehydration    Discharge Instructions  Discharge Instructions     Diet general   Complete by: As directed    Comfort feeding      Allergies as of 12/17/2021   No Known Allergies      Medication List     STOP taking these medications    dexamethasone 4 MG tablet Commonly known as: DECADRON   furosemide 40 MG tablet Commonly known as: Lasix   LORazepam 0.5 MG tablet Commonly known as: Ativan   multivitamin with minerals Tabs tablet   ondansetron 4 MG tablet Commonly known as: ZOFRAN   pantoprazole 40 MG tablet Commonly known as: PROTONIX   sucralfate 1 GM/10ML suspension Commonly known as: CARAFATE   traMADol 50 MG tablet Commonly known as: ULTRAM        No Known Allergies  Consultations: Palliative and hospice Oncology   Procedures/Studies: DG Chest Port 1 View  Result Date: 12/13/2021 CLINICAL DATA:  Upper abdominal pain EXAM: PORTABLE CHEST 1 VIEW COMPARISON:  12/05/2021 FINDINGS: Heart and mediastinal contours are within normal limits. No focal opacities or effusions. No acute bony abnormality. IMPRESSION: No active disease. Electronically Signed   By: Rolm Baptise M.D.   On: 12/13/2021 20:14   CT HEAD W & WO CONTRAST (5MM)  Result Date: 12/06/2021 CLINICAL DATA:  Metastatic disease evaluation. EXAM: CT HEAD WITHOUT AND WITH CONTRAST TECHNIQUE: Contiguous axial images were obtained from the base of the skull through the  vertex without and with intravenous contrast. RADIATION DOSE REDUCTION: This exam was performed according to the departmental dose-optimization program which includes automated  exposure control, adjustment of the mA and/or kV according to patient size and/or use of iterative reconstruction technique. CONTRAST:  88m OMNIPAQUE IOHEXOL 300 MG/ML  SOLN COMPARISON:  None Available. FINDINGS: Brain: No acute infarct, hemorrhage, or mass lesion is present. Postcontrast images demonstrate no pathologic enhancement. No significant white matter lesions are present. The ventricles are of normal size. No significant extraaxial fluid collection is present. Vascular: No hyperdense vessel or unexpected calcification. Visible vessels are patent. Skull: Calvarium is intact. No focal lytic or blastic lesions are present. No significant extracranial soft tissue lesion is present. Sinuses/Orbits: The paranasal sinuses and mastoid air cells are clear. The globes and orbits are within normal limits. IMPRESSION: Negative CT of the head without and with contrast. No evidence for metastatic disease. Electronically Signed   By: CSan MorelleM.D.   On: 12/06/2021 16:00   CT CHEST W CONTRAST  Result Date: 12/06/2021 CLINICAL DATA:  Assess for occult malignancy. Metastatic lesions noted in the liver and bones on a CT of the abdomen and pelvis, 12/05/2021. EXAM: CT CHEST WITH CONTRAST TECHNIQUE: Multidetector CT imaging of the chest was performed during intravenous contrast administration. RADIATION DOSE REDUCTION: This exam was performed according to the departmental dose-optimization program which includes automated exposure control, adjustment of the mA and/or kV according to patient size and/or use of iterative reconstruction technique. CONTRAST:  738mOMNIPAQUE IOHEXOL 300 MG/ML  SOLN COMPARISON:  CT abdomen and pelvis, 12/05/2021. Previous day's chest radiograph. FINDINGS: Cardiovascular: Heart normal in size and configuration. Left and right coronary artery calcifications. No pericardial effusion. Great vessels are normal in caliber. Minor aortic atherosclerosis. Arch branch vessels are widely  patent. Mediastinum/Nodes: No enlarged mediastinal, hilar, or axillary lymph nodes. Thyroid gland, trachea, and esophagus demonstrate no significant findings. Lungs/Pleura: Minor dependent subsegmental atelectasis in the posterior lower lobes. Lungs otherwise clear. Specifically, no lung mass or nodule. No pleural effusion or pneumothorax. Upper Abdomen: No acute findings. Multiple liver masses consistent with metastatic disease as noted on the previous day's exam. Musculoskeletal: Numerous lytic bone lesions most evident along the spine, few along the ribs, several in the sternum and several involving the scapula. No chest wall mass. IMPRESSION: 1. No evidence of a primary malignancy in the chest. 2. No evidence of lung metastatic disease or metastatic mediastinal or hilar lymphadenopathy. 3. Lytic skeletal lesions consistent with metastatic disease, as were noted on the previous day's abdomen and pelvis CT. Aortic Atherosclerosis (ICD10-I70.0). Electronically Signed   By: DaLajean Manes.D.   On: 12/06/2021 15:42   CT Abdomen Pelvis W Contrast  Result Date: 12/05/2021 CLINICAL DATA:  Nausea vomiting.  Abdominal pain. EXAM: CT ABDOMEN AND PELVIS WITH CONTRAST TECHNIQUE: Multidetector CT imaging of the abdomen and pelvis was performed using the standard protocol following bolus administration of intravenous contrast. RADIATION DOSE REDUCTION: This exam was performed according to the departmental dose-optimization program which includes automated exposure control, adjustment of the mA and/or kV according to patient size and/or use of iterative reconstruction technique. CONTRAST:  10076mMNIPAQUE IOHEXOL 300 MG/ML  SOLN COMPARISON:  05/30/2021. FINDINGS: Lower chest: Clear lung bases. Hepatobiliary: Numerous hypoattenuating liver masses that are new from the prior CT, largest in the right lobe, segment 7, 4.7 cm. Possible small gallstone. No acute cholecystitis. No bile duct dilation. Pancreas: Unremarkable. No  pancreatic ductal dilatation or surrounding inflammatory changes. Spleen: Normal in  size without focal abnormality. Adrenals/Urinary Tract: Normal adrenal glands. Kidneys normal size, orientation and position with symmetric enhancement and excretion. Mid to upper pole left renal cyst, 1.1 cm. No follow-up recommended. No other renal masses, no stones and no hydronephrosis. Normal ureters. Bladder is unremarkable. Stomach/Bowel: Stomach is mostly decompressed, otherwise unremarkable. Small bowel is normal in caliber. No wall thickening or inflammation. Stomach is mildly distended, mostly along the ascending and transverse portions, with moderate increased stool burden. No colonic wall thickening. No inflammatory changes. No evidence of a mass. Normal appendix visualized. Vascular/Lymphatic: Aortic atherosclerosis. No aneurysm. Poorly defined enlarged porta hepatis lymph nodes, up to 1.4 cm in short axis. Reproductive: Enlarged prostate, 4.5 x 4.4 cm. Other: No ascites. Musculoskeletal: Numerous lytic bone lesions throughout the visualized spine, as well as the pelvis. No fracture. IMPRESSION: 1. Numerous liver lesions as well as numerous lytic bone lesions consistent with metastatic disease. Primary malignancy not defined on this exam. 2. No acute findings. 3. Moderate increase in the colonic stool burden. 4. Aortic atherosclerosis. Electronically Signed   By: Lajean Manes M.D.   On: 12/05/2021 14:40   DG Chest Port 1 View  Result Date: 12/05/2021 CLINICAL DATA:  Abdominal pain.  Hematemesis.  Former smoker. EXAM: PORTABLE CHEST 1 VIEW COMPARISON:  12/12/2017. FINDINGS: Cardiac silhouette normal in size. Normal mediastinal and hilar contours. Clear lungs.  No pleural effusion or pneumothorax. Skeletal structures are grossly intact. IMPRESSION: No active disease. Electronically Signed   By: Lajean Manes M.D.   On: 12/05/2021 12:36   (Echo, Carotid, EGD, Colonoscopy, ERCP)    Subjective: Patient seen and  examined.  Daughter at the bedside.  Remains barely awake, gets uncomfortable on stimulation.   Discharge Exam: Vitals:   12/16/21 2130 12/17/21 0426  BP: (!) 103/58 104/78  Pulse: 75 77  Resp: 20 20  Temp: 98.1 F (36.7 C) 98.2 F (36.8 C)  SpO2: 94% 94%   Vitals:   12/16/21 0859 12/16/21 0915 12/16/21 2130 12/17/21 0426  BP: (!) 98/59 (!) 96/52 (!) 103/58 104/78  Pulse: 62 63 75 77  Resp: '12 10 20 20  '$ Temp: 97.7 F (36.5 C) 98.4 F (36.9 C) 98.1 F (36.7 C) 98.2 F (36.8 C)  TempSrc: Oral Oral Oral Oral  SpO2: 96% 95% 94% 94%  Weight:      Height:        Lethargic, unable to follow any commands.  Restless on the stimulation and exam.  On room air.  Breathing is regular.  Icteric.    The results of significant diagnostics from this hospitalization (including imaging, microbiology, ancillary and laboratory) are listed below for reference.     Microbiology: No results found for this or any previous visit (from the past 240 hour(s)).   Labs: BNP (last 3 results) No results for input(s): "BNP" in the last 8760 hours. Basic Metabolic Panel: Recent Labs  Lab 12/11/21 0353 12/12/21 0406 12/13/21 1933 12/14/21 0342 12/15/21 0410 12/16/21 0424  NA 135 133* 134* 137 137 136  K 4.1 3.8 4.5 3.8 3.8 3.8  CL 100 101 99 106 104 105  CO2 '24 25 28 25 22 '$ 21*  GLUCOSE 118* 87 91 81 61* 65*  BUN '8 13 16 18 21 23  '$ CREATININE 0.79 1.01 0.88 0.90 1.06 1.08  CALCIUM 12.0* 12.5* 14.2* 13.8* 13.0* 12.8*  MG 1.3* 1.7  --  1.4* 1.6*  --   PHOS 3.9  --   --   --  2.9  --  Liver Function Tests: Recent Labs  Lab 12/12/21 0406 12/13/21 1933 12/14/21 0342 12/15/21 0410 12/16/21 0424  AST 257* 391* 369* 520* 707*  ALT 160* 172* 170* 242* 291*  ALKPHOS 249* 277* 247* 292* 352*  BILITOT 0.6 1.0 0.7 1.0 1.1  PROT 6.1* 6.5 5.6* 5.4* 5.6*  ALBUMIN 3.0* 3.1* 2.7* 2.5* 2.6*   Recent Labs  Lab 12/13/21 1933  LIPASE 26   No results for input(s): "AMMONIA" in the last 168  hours. CBC: Recent Labs  Lab 12/11/21 0353 12/13/21 1933 12/14/21 0342 12/16/21 0424  WBC 9.3 12.5* 10.8* 11.4*  NEUTROABS 6.9 9.0*  --  8.4*  HGB 9.8* 10.5* 8.9* 8.7*  HCT 28.6* 31.1* 26.8* 26.9*  MCV 86.7 86.4 88.7 89.4  PLT 200 212 190 181   Cardiac Enzymes: No results for input(s): "CKTOTAL", "CKMB", "CKMBINDEX", "TROPONINI" in the last 168 hours. BNP: Invalid input(s): "POCBNP" CBG: No results for input(s): "GLUCAP" in the last 168 hours. D-Dimer No results for input(s): "DDIMER" in the last 72 hours. Hgb A1c No results for input(s): "HGBA1C" in the last 72 hours. Lipid Profile No results for input(s): "CHOL", "HDL", "LDLCALC", "TRIG", "CHOLHDL", "LDLDIRECT" in the last 72 hours. Thyroid function studies No results for input(s): "TSH", "T4TOTAL", "T3FREE", "THYROIDAB" in the last 72 hours.  Invalid input(s): "FREET3" Anemia work up Recent Labs    12/15/21 0822  FERRITIN 686*  TIBC 268  IRON 37*   Urinalysis    Component Value Date/Time   COLORURINE YELLOW 12/13/2021 0125   APPEARANCEUR CLEAR 12/13/2021 0125   LABSPEC 1.011 12/13/2021 0125   PHURINE 5.0 12/13/2021 0125   GLUCOSEU NEGATIVE 12/13/2021 0125   HGBUR NEGATIVE 12/13/2021 0125   BILIRUBINUR NEGATIVE 12/13/2021 0125   KETONESUR NEGATIVE 12/13/2021 0125   PROTEINUR NEGATIVE 12/13/2021 0125   NITRITE NEGATIVE 12/13/2021 0125   LEUKOCYTESUR NEGATIVE 12/13/2021 0125   Sepsis Labs Recent Labs  Lab 12/11/21 0353 12/13/21 1933 12/14/21 0342 12/16/21 0424  WBC 9.3 12.5* 10.8* 11.4*   Microbiology No results found for this or any previous visit (from the past 240 hour(s)).   Time coordinating discharge:  35 minutes  SIGNED:   Barb Merino, MD  [facility] 12/17/2021, 9:51 AM

## 2021-12-30 DEATH — deceased
